# Patient Record
Sex: Male | Born: 1960 | Race: White | Hispanic: No | Marital: Married | Smoking: Never smoker
Health system: Southern US, Community
[De-identification: ages and names within clinical notes are randomized; demographics above are authoritative.]

## PROBLEM LIST (undated history)

## (undated) DIAGNOSIS — K219 Gastro-esophageal reflux disease without esophagitis: Secondary | ICD-10-CM

## (undated) DIAGNOSIS — R011 Cardiac murmur, unspecified: Secondary | ICD-10-CM

## (undated) DIAGNOSIS — G43909 Migraine, unspecified, not intractable, without status migrainosus: Secondary | ICD-10-CM

## (undated) DIAGNOSIS — I10 Essential (primary) hypertension: Secondary | ICD-10-CM

## (undated) DIAGNOSIS — J189 Pneumonia, unspecified organism: Secondary | ICD-10-CM

## (undated) DIAGNOSIS — E785 Hyperlipidemia, unspecified: Secondary | ICD-10-CM

## (undated) DIAGNOSIS — G4730 Sleep apnea, unspecified: Secondary | ICD-10-CM

## (undated) DIAGNOSIS — T7840XA Allergy, unspecified, initial encounter: Secondary | ICD-10-CM

## (undated) DIAGNOSIS — K5792 Diverticulitis of intestine, part unspecified, without perforation or abscess without bleeding: Secondary | ICD-10-CM

## (undated) DIAGNOSIS — Z87442 Personal history of urinary calculi: Secondary | ICD-10-CM

## (undated) DIAGNOSIS — K921 Melena: Secondary | ICD-10-CM

## (undated) HISTORY — DX: Cardiac murmur, unspecified: R01.1

## (undated) HISTORY — DX: Hyperlipidemia, unspecified: E78.5

## (undated) HISTORY — PX: NASAL RECONSTRUCTION WITH SEPTAL REPAIR: SHX5665

## (undated) HISTORY — DX: Melena: K92.1

## (undated) HISTORY — DX: Pneumonia, unspecified organism: J18.9

## (undated) HISTORY — PX: VASECTOMY: SHX75

## (undated) HISTORY — PX: APPENDECTOMY: SHX54

## (undated) HISTORY — DX: Migraine, unspecified, not intractable, without status migrainosus: G43.909

## (undated) HISTORY — PX: COLON SURGERY: SHX602

## (undated) HISTORY — DX: Allergy, unspecified, initial encounter: T78.40XA

---

## 1999-10-09 HISTORY — PX: SPINE SURGERY: SHX786

## 2012-06-26 ENCOUNTER — Ambulatory Visit (INDEPENDENT_AMBULATORY_CARE_PROVIDER_SITE_OTHER): Payer: BC Managed Care – PPO | Admitting: Family Medicine

## 2012-06-26 ENCOUNTER — Encounter: Payer: Self-pay | Admitting: Family Medicine

## 2012-06-26 VITALS — BP 130/92 | HR 72 | Temp 98.0°F | Resp 12 | Ht 69.25 in | Wt 192.0 lb

## 2012-06-26 DIAGNOSIS — Z139 Encounter for screening, unspecified: Secondary | ICD-10-CM

## 2012-06-26 DIAGNOSIS — Z8669 Personal history of other diseases of the nervous system and sense organs: Secondary | ICD-10-CM | POA: Insufficient documentation

## 2012-06-26 DIAGNOSIS — G43109 Migraine with aura, not intractable, without status migrainosus: Secondary | ICD-10-CM | POA: Insufficient documentation

## 2012-06-26 DIAGNOSIS — G43809 Other migraine, not intractable, without status migrainosus: Secondary | ICD-10-CM

## 2012-06-26 DIAGNOSIS — G252 Other specified forms of tremor: Secondary | ICD-10-CM

## 2012-06-26 DIAGNOSIS — F5104 Psychophysiologic insomnia: Secondary | ICD-10-CM

## 2012-06-26 DIAGNOSIS — Z8042 Family history of malignant neoplasm of prostate: Secondary | ICD-10-CM

## 2012-06-26 DIAGNOSIS — J309 Allergic rhinitis, unspecified: Secondary | ICD-10-CM

## 2012-06-26 DIAGNOSIS — G250 Essential tremor: Secondary | ICD-10-CM

## 2012-06-26 DIAGNOSIS — Z23 Encounter for immunization: Secondary | ICD-10-CM

## 2012-06-26 DIAGNOSIS — E785 Hyperlipidemia, unspecified: Secondary | ICD-10-CM | POA: Insufficient documentation

## 2012-06-26 DIAGNOSIS — G4700 Insomnia, unspecified: Secondary | ICD-10-CM

## 2012-06-26 LAB — HEPATIC FUNCTION PANEL
ALT: 34 U/L (ref 0–53)
AST: 25 U/L (ref 0–37)
Bilirubin, Direct: 0.3 mg/dL (ref 0.0–0.3)
Total Bilirubin: 2.5 mg/dL — ABNORMAL HIGH (ref 0.3–1.2)

## 2012-06-26 LAB — LIPID PANEL: Total CHOL/HDL Ratio: 6

## 2012-06-26 MED ORDER — CLONAZEPAM 0.5 MG PO TABS: 0.5000 mg | ORAL_TABLET | Freq: Every evening | ORAL | Status: DC | PRN

## 2012-06-26 MED ORDER — AZELASTINE HCL 0.1 % NA SOLN: 1.0000 | Freq: Two times a day (BID) | NASAL | Status: DC

## 2012-06-26 NOTE — Progress Notes (Signed)
  Subjective:    Patient ID: Tim Gordon, male    DOB: 1961/01/06, 51 y.o.   MRN: [id number]  HPI  Patient here to establish care. Past medical history reviewed. He has history of ocular migraines, dyslipidemia, chronic insomnia, essential tremor. Apparently had some intolerance previously to Niaspan and has taken couple of statins and likewise had side effects. He is currently treated with Zetia and Lovaza.  No history of diabetes. No history of CAD.  Patient has frequent nasal congestion symptoms. Usually relieved with Afrin and avoids using regularly. Previously used Flonase without much success. Has occasional drainage but mostly stuffiness. He thinks this may be allergy related. This seems to be year- round.  Previous surgery for appendectomy, vastectomy, and low back surgery 2001.  Family history significant for followup prostate cancer. Mother with valvular heart disease (no CAD) and hyperlipidemia as well as stroke history. Both parents had hypertension.  Patient works as a Armed forces training and education officer). He is married. Nonsmoker. Occasional alcohol use.  Past Medical History  Diagnosis Date  . Blood in stool   . Allergy   . Heart murmur   . Hyperlipidemia   . Migraines    Past Surgical History  Procedure Date  . Appendectomy   . Vasectomy   . Spine surgery 2001    rupture L4-L5    reports that he has never smoked. He does not have any smokeless tobacco history on file. His alcohol and drug histories not on file. family history includes Arthritis in his mother; Cancer in his maternal grandmother; Cancer (age of onset:65) in his father; Heart disease in his mother; Hyperlipidemia in his mother; Hypertension in his father and mother; and Stroke in his mother and paternal grandfather. Allergies  Allergen Reactions  . Biaxin (Clarithromycin)     "went right through me"      Review of Systems  Constitutional: Negative for fever, chills, appetite change,  fatigue and unexpected weight change.  HENT: Positive for congestion.   Respiratory: Negative for cough and shortness of breath.   Cardiovascular: Negative for chest pain.  Gastrointestinal: Negative for abdominal pain.  Neurological: Negative for headaches.  Hematological: Negative for adenopathy.       Objective:   Physical Exam  Constitutional: He is oriented to person, place, and time. He appears well-developed and well-nourished. No distress.  HENT:  Right Ear: External ear normal.  Left Ear: External ear normal.  Mouth/Throat: Oropharynx is clear and moist.  Neck: Neck supple. No thyromegaly present.  Cardiovascular: Normal rate and regular rhythm.   Pulmonary/Chest: Effort normal and breath sounds normal. No respiratory distress. He has no wheezes. He has no rales.  Musculoskeletal: He exhibits no edema.  Lymphadenopathy:    He has no cervical adenopathy.  Neurological: He is alert and oriented to person, place, and time. No cranial nerve deficit.          Assessment & Plan:  #1 dyslipidemia. Repeat lipid panel and hepatic panel. History of what sounds like very high triglycerides  #2 rhinitis, probably allergic. Trial of Astelin nasal 1 spray per nostril twice daily  #3 history of ocular migraines which are infrequent  #4 history of chronic insomnia. Refill clonazepam for 6 months  #5 essential tremor. Continue low-dose propranolol #6 history of reported heart murmur though none noted on exam today

## 2012-06-27 NOTE — Progress Notes (Signed)
Quick Note:  Pt informed ______ 

## 2012-09-16 ENCOUNTER — Telehealth: Payer: Self-pay | Admitting: Family Medicine

## 2012-09-16 DIAGNOSIS — G250 Essential tremor: Secondary | ICD-10-CM

## 2012-09-16 MED ORDER — PROPRANOLOL HCL 20 MG PO TABS: 20.0000 mg | ORAL_TABLET | Freq: Every day | ORAL | Status: DC

## 2012-09-16 NOTE — Telephone Encounter (Signed)
Patient's wife called stating that he need a new rx for his propranolol hcl 20mg  1poqd sent to [facility] for a 90 day supply. Please assist.

## 2012-12-01 ENCOUNTER — Ambulatory Visit (INDEPENDENT_AMBULATORY_CARE_PROVIDER_SITE_OTHER): Payer: BC Managed Care – PPO | Admitting: Family Medicine

## 2012-12-01 ENCOUNTER — Encounter: Payer: Self-pay | Admitting: Family Medicine

## 2012-12-01 VITALS — BP 128/90 | HR 67 | Temp 98.7°F | Wt 198.0 lb

## 2012-12-01 DIAGNOSIS — B9789 Other viral agents as the cause of diseases classified elsewhere: Secondary | ICD-10-CM

## 2012-12-01 MED ORDER — HYDROCODONE-HOMATROPINE 5-1.5 MG/5ML PO SYRP: 5.0000 mL | ORAL_SOLUTION | Freq: Every evening | ORAL | Status: DC | PRN

## 2012-12-01 MED ORDER — ALBUTEROL SULFATE HFA 108 (90 BASE) MCG/ACT IN AERS: 2.0000 | INHALATION_SPRAY | Freq: Four times a day (QID) | RESPIRATORY_TRACT | Status: DC | PRN

## 2012-12-01 MED ORDER — FLUTICASONE PROPIONATE 50 MCG/ACT NA SUSP: 2.0000 | Freq: Every day | NASAL | Status: DC

## 2012-12-01 NOTE — Progress Notes (Signed)
Chief Complaint  Patient presents with  . Cough    back of throat scratchy, right ear plugged, drainage, fatigue, congestion, chest heavy    HPI:  Acute visit for cough and ear congestion: -started: 6 days ago -symptoms:nasal congestion, sore and scratchy  throat, cough, drainage in throat, this mucus, chest heavy when coughing, R ear is full, sinus pressure on and off -denies:fever, SOB, NVD, tooth pain, strep or mono exposure -has tried: sudafed and OTC cough medication -sick contacts: none known -Hx of: hx of allergies, no hx lung disease or sinusitis  ROS: See pertinent positives and negatives per HPI.  Past Medical History  Diagnosis Date  . Blood in stool   . Allergy   . Heart murmur   . Hyperlipidemia   . Migraines     Family History  Problem Relation Age of Onset  . Arthritis Mother   . Hyperlipidemia Mother   . Stroke Mother   . Hypertension Mother   . Heart disease Mother     valvular heart disease ? which valve.  . Cancer Father 15    prostate  . Hypertension Father   . Cancer Maternal Grandmother     ovarian  . Stroke Paternal Grandfather     History   Social History  . Marital Status: Married    Spouse Name: N/A    Number of Children: N/A  . Years of Education: N/A   Social History Main Topics  . Smoking status: Never Smoker   . Smokeless tobacco: None  . Alcohol Use: None  . Drug Use: None  . Sexually Active: None   Other Topics Concern  . None   Social History Narrative  . None    Current outpatient prescriptions:clonazePAM (KLONOPIN) 0.5 MG tablet, Take 1 tablet (0.5 mg total) by mouth at bedtime as needed for anxiety. 1/2 tab at bedtime, Disp: 30 tablet, Rfl: 5;  LOVAZA 1 G capsule, Take 2 g by mouth 2 (two) times daily. , Disp: , Rfl: ;  propranolol (INDERAL) 20 MG tablet, Take 1 tablet (20 mg total) by mouth daily., Disp: 90 tablet, Rfl: 3;  ZETIA 10 MG tablet, Take 10 mg by mouth daily. , Disp: , Rfl:  albuterol (PROVENTIL  HFA;VENTOLIN HFA) 108 (90 BASE) MCG/ACT inhaler, Inhale 2 puffs into the lungs every 6 (six) hours as needed for wheezing., Disp: 1 Inhaler, Rfl: 0;  azelastine (ASTELIN) 137 MCG/SPRAY nasal spray, Place 1 spray into the nose 2 (two) times daily. Use in each nostril as directed, Disp: 30 mL, Rfl: 12;  fluticasone (FLONASE) 50 MCG/ACT nasal spray, Place 2 sprays into the nose daily., Disp: 16 g, Rfl: 2 HYDROcodone-homatropine (HYCODAN) 5-1.5 MG/5ML syrup, Take 5 mLs by mouth at bedtime as needed for cough., Disp: 120 mL, Rfl: 0  EXAM:  Filed Vitals:   12/01/12 0911  BP: 128/90  Pulse: 67  Temp: 98.7 F (37.1 C)    Body mass index is 29.03 kg/(m^2).  GENERAL: vitals reviewed and listed above, alert, oriented, appears well hydrated and in no acute distress  HEENT: atraumatic, conjunttiva clear, no obvious abnormalities on inspection of external nose and ears, normal appearance of ear canals and TMs, clear nasal congestion, mild post oropharyngeal erythema with PND, no tonsillar edema or exudate, no sinus TTP  NECK: no obvious masses on inspection  LUNGS: clear to auscultation bilaterally, no wheezes, rales or rhonchi, good air movement  CV: HRRR, no peripheral edema  MS: moves all extremities without noticeable abnormality  PSYCH: pleasant and cooperative, no obvious depression or anxiety  ASSESSMENT AND PLAN:  Discussed the following assessment and plan:  Unspecified viral infection, in conditions classified elsewhere and of unspecified site - Plan: fluticasone (FLONASE) 50 MCG/ACT nasal spray, albuterol (PROVENTIL HFA;VENTOLIN HFA) 108 (90 BASE) MCG/ACT inhaler, HYDROcodone-homatropine (HYCODAN) 5-1.5 MG/5ML syrup  -advised supportive care and return precautions. Cough medication given - risks discussed. -Patient advised to return or notify a doctor immediately if symptoms worsen or persist or new concerns arise.  Patient Instructions  INSTRUCTIONS FOR UPPER RESPIRATORY  INFECTION:  -plenty of rest and fluids  -nasal saline wash 2-3 times daily (use prepackaged nasal saline or bottled/distilled water if making your own)   -clean nose with nasal saline before using the nasal steroid or sinex  -can use sinex nasal spray for drainage and nasal congestion - but do NOT use longer then 3-4 days  -can use tylenol or ibuprofen as directed for aches and sorethroat  -in the winter time, using a humidifier at night is helpful (please follow cleaning instructions)  -if you are taking a cough medication - use only as directed, may also try a teaspoon of honey to coat the throat and throat lozenges  -for sore throat, salt water gargles can help  -follow up if you have fevers, facial pain, tooth pain, difficulty breathing or are worsening or not getting better in 5-7 days      Donnielle Addison R.

## 2012-12-01 NOTE — Patient Instructions (Addendum)

## 2012-12-24 ENCOUNTER — Ambulatory Visit: Payer: BC Managed Care – PPO | Admitting: Family Medicine

## 2012-12-30 ENCOUNTER — Encounter: Payer: Self-pay | Admitting: Family Medicine

## 2012-12-30 ENCOUNTER — Ambulatory Visit (INDEPENDENT_AMBULATORY_CARE_PROVIDER_SITE_OTHER): Payer: BC Managed Care – PPO | Admitting: Family Medicine

## 2012-12-30 VITALS — BP 118/82 | Temp 98.3°F | Wt 200.0 lb

## 2012-12-30 DIAGNOSIS — R0683 Snoring: Secondary | ICD-10-CM

## 2012-12-30 DIAGNOSIS — M7582 Other shoulder lesions, left shoulder: Secondary | ICD-10-CM

## 2012-12-30 DIAGNOSIS — E785 Hyperlipidemia, unspecified: Secondary | ICD-10-CM

## 2012-12-30 DIAGNOSIS — M25512 Pain in left shoulder: Secondary | ICD-10-CM

## 2012-12-30 DIAGNOSIS — M67919 Unspecified disorder of synovium and tendon, unspecified shoulder: Secondary | ICD-10-CM

## 2012-12-30 DIAGNOSIS — M25519 Pain in unspecified shoulder: Secondary | ICD-10-CM

## 2012-12-30 DIAGNOSIS — F5104 Psychophysiologic insomnia: Secondary | ICD-10-CM

## 2012-12-30 DIAGNOSIS — G4700 Insomnia, unspecified: Secondary | ICD-10-CM

## 2012-12-30 DIAGNOSIS — G250 Essential tremor: Secondary | ICD-10-CM

## 2012-12-30 DIAGNOSIS — R0609 Other forms of dyspnea: Secondary | ICD-10-CM

## 2012-12-30 NOTE — Patient Instructions (Addendum)
Consider complete physical at follow up next Fall. Try daily use of Flonase and Astelin.

## 2012-12-30 NOTE — Progress Notes (Signed)
  Subjective:    Patient ID: Tim Gordon, male    DOB: [birth year], 52 y.o.   MRN: [id number]  HPI Patient seen for several issues as follows  Skin rash left forearm. Mostly asymptomatic. Onset couple days ago after doing yard work. Has not tried anything topically.  Progressive left shoulder pain for several months. No injury. No weakness. Describes achiness especially at night. Worse with abduction and internal rotation. History of similar process in the past. Over the years had about 3 corticosteroid injections. Never x-rayed.  Progressive snoring. Previously studied for sleep apnea with none noted. Frequent nasal congestion. Has prescriptions for Flonase and Astelin but not taking either consistently No purulent secretions. Afrin helps slightly.  Chronic insomnia treated with Klonopin. No problems. Essential tremor treated with Inderal. Continues to control tremor fairly well.  Past Medical History  Diagnosis Date  . Blood in stool   . Allergy   . Heart murmur   . Hyperlipidemia   . Migraines    Past Surgical History  Procedure Laterality Date  . Appendectomy    . Vasectomy    . Spine surgery  2001    rupture L4-L5    reports that he has never smoked. He does not have any smokeless tobacco history on file. His alcohol and drug histories are not on file. family history includes Arthritis in his mother; Cancer in his maternal grandmother; Cancer (age of onset: 4) in his father; Heart disease in his mother; Hyperlipidemia in his mother; Hypertension in his father and mother; and Stroke in his mother and paternal grandfather. Allergies  Allergen Reactions  . Biaxin (Clarithromycin)     "went right through me"      Review of Systems  Constitutional: Negative for fatigue.  Eyes: Negative for visual disturbance.  Respiratory: Negative for cough, chest tightness and shortness of breath.   Cardiovascular: Negative for chest pain, palpitations and leg swelling.   Neurological: Negative for dizziness, syncope, weakness, light-headedness and headaches.       Objective:   Physical Exam  Constitutional: He appears well-developed and well-nourished.  Neck: Neck supple. No thyromegaly present.  Cardiovascular: Normal rate and regular rhythm.   Pulmonary/Chest: Effort normal and breath sounds normal. No respiratory distress. He has no wheezes. He has no rales.  Musculoskeletal:  Left shoulder reveals full range of motion. Mild pain with abduction against resistance. No pain with external rotation. No bicipital tenderness. No a.c. joint tenderness. No localized bony tenderness  Lymphadenopathy:    He has no cervical adenopathy.  Neurological:  Full-strength upper extremities including rotator cuff  Skin: Rash noted.  Left forearm reveals slightly erythematous follicular type rash with no pustules left forearm. No vesicles. Minimally raised. Nontender. He has a few similar lesions on the right forearm          Assessment & Plan:  #1 essential tremor. Stable on low-dose Inderal #2 chronic insomnia. Continue clonazepam which he has taken for years. #3 nonspecific follicular rash left forearm. Reassurance. Try topical over-the-counter hydrocortisone cream #4 left shoulder pain. Suspect rotator cuff tendinitis. Given duration of symptoms x-rays left shoulder. Consider corticosteroid injection if no significant abnormalities. No suspicion for rotator cuff tear  #5 nasal congestion probably secondary to allergic rhinitis. Try regular Flonase and Astelin over the next month and be in touch if not seeing significant improvement

## 2012-12-31 ENCOUNTER — Ambulatory Visit (INDEPENDENT_AMBULATORY_CARE_PROVIDER_SITE_OTHER)
Admission: RE | Admit: 2012-12-31 | Discharge: 2012-12-31 | Disposition: A | Discharge status: Home / Self Care | Payer: BC Managed Care – PPO | Source: Ambulatory Visit | Attending: Family Medicine | Admitting: Family Medicine

## 2012-12-31 DIAGNOSIS — M25512 Pain in left shoulder: Secondary | ICD-10-CM

## 2012-12-31 DIAGNOSIS — M25519 Pain in unspecified shoulder: Secondary | ICD-10-CM

## 2013-01-01 NOTE — Progress Notes (Signed)
Quick Note:  Pt would like ortho referral and would appreciate your recommendation. ______

## 2013-01-03 ENCOUNTER — Other Ambulatory Visit: Payer: Self-pay | Admitting: Family Medicine

## 2013-01-05 NOTE — Telephone Encounter (Signed)
Clonazepam at [facility] last filled 06-26-12, #30 with 5 refills

## 2013-01-06 NOTE — Telephone Encounter (Signed)
Refill for 6 months. 

## 2013-02-13 ENCOUNTER — Ambulatory Visit (INDEPENDENT_AMBULATORY_CARE_PROVIDER_SITE_OTHER): Payer: BC Managed Care – PPO | Admitting: Family Medicine

## 2013-02-13 ENCOUNTER — Encounter: Payer: Self-pay | Admitting: Family Medicine

## 2013-02-13 VITALS — BP 120/88 | Temp 98.2°F

## 2013-02-13 DIAGNOSIS — M7530 Calcific tendinitis of unspecified shoulder: Secondary | ICD-10-CM

## 2013-02-13 DIAGNOSIS — M7532 Calcific tendinitis of left shoulder: Secondary | ICD-10-CM

## 2013-02-13 DIAGNOSIS — R059 Cough, unspecified: Secondary | ICD-10-CM

## 2013-02-13 DIAGNOSIS — R053 Chronic cough: Secondary | ICD-10-CM

## 2013-02-13 DIAGNOSIS — R05 Cough: Secondary | ICD-10-CM

## 2013-02-13 NOTE — Patient Instructions (Addendum)
Elevate head of bed 6-8 inches. Avoid any mentholated cough drops or mints Try over the counter antihistamine such as brompheniramine or chorpheniramine at night. Stay on daytime Zyrtec. Start over the counter Prilosec one daily -20 mg  Pulmicort 2 puffs twice daily and rinse mouth after each use.  We will call you with orthopedic referral.

## 2013-02-13 NOTE — Progress Notes (Signed)
  Subjective:    Patient ID: Tim Gordon, male    DOB: 1961-08-07, 52 y.o.   MRN: [id number]  HPI Patient here for the following issues  Chronic cough since February. Cough did briefly improved after initial treatment of antibiotic but has been persistent and intermittent since. Cough is dry. No associated fevers, chills, night sweats, appetite changes, or any weight changes. Nonsmoker. No obvious wheezing. Does have some nasal congestion and allergic postnasal drip symptoms. Occasional GERD symptoms but none actively. Cough is worse during the day versus night. He was in [place] in late April and went to urgent care. Prescribed Zithromax and chest x-ray apparently showed" bronchitis" changes.  Generally does not feel poorly but cough is bothersome. Has taken Tessalon Perles without relief. Using occasional mentholated cough drops.  Already taking Zyrtec and Astelin for allergy issues. No ACE inhibitor use.  Persistent left shoulder pains. Recent x-rays showed probable calcific tendinitis. Starting to have increased night pain. No weakness. Pain is moderate and worse with abduction. Has had previous steroid injections x2 without improvement  Past Medical History  Diagnosis Date  . Blood in stool   . Allergy   . Heart murmur   . Hyperlipidemia   . Migraines    Past Surgical History  Procedure Laterality Date  . Appendectomy    . Vasectomy    . Spine surgery  2001    rupture L4-L5    reports that he has never smoked. He does not have any smokeless tobacco history on file. His alcohol and drug histories are not on file. family history includes Arthritis in his mother; Cancer in his maternal grandmother; Cancer (age of onset: 77) in his father; Heart disease in his mother; Hyperlipidemia in his mother; Hypertension in his father and mother; and Stroke in his mother and paternal grandfather. Allergies  Allergen Reactions  . Biaxin (Clarithromycin)     "went right through me"       Review of Systems  Constitutional: Negative for fever, chills, appetite change, fatigue and unexpected weight change.  HENT: Positive for postnasal drip. Negative for sore throat and sinus pressure.   Respiratory: Positive for cough. Negative for shortness of breath and wheezing.   Cardiovascular: Negative for chest pain, palpitations and leg swelling.  Neurological: Negative for dizziness and syncope.       Objective:   Physical Exam  Constitutional: He appears well-developed and well-nourished.  HENT:  Right Ear: External ear normal.  Left Ear: External ear normal.  Mouth/Throat: Oropharynx is clear and moist.  Neck: Neck supple. No thyromegaly present.  Cardiovascular: Normal rate and regular rhythm.   Pulmonary/Chest: Effort normal and breath sounds normal. No respiratory distress. He has no wheezes. He has no rales.  Musculoskeletal: He exhibits no edema.  Left shoulder reveals pain with abduction against resistance. No definite weakness.  Lymphadenopathy:    He has no cervical adenopathy.          Assessment & Plan:  #1 chronic cough. Recent chest x-ray as above reportedly normal. Nonfocal exam. Question allergic postnasal drip. Question GERD component. Question post viral bronchitis. Patient instructed to elevate head of bed 6-8 inches. Avoid mint or mentholated products. Try over-the-counter brompheniramine or chlorpheniramine at night. Continue daytime use of Zyrtec. Over-the-counter Prilosec 20 milligrams daily. Doubt reactive airway component but sample given Pulmicort 2 puffs twice daily with instructions for use. Consider pulmonary referral if no better in 2 weeks #2 chronic left shoulder pain. 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 Orthopedic referral

## 2013-04-21 ENCOUNTER — Telehealth: Payer: Self-pay | Admitting: Family Medicine

## 2013-04-21 DIAGNOSIS — E785 Hyperlipidemia, unspecified: Secondary | ICD-10-CM

## 2013-04-21 NOTE — Telephone Encounter (Signed)
Pt is a new pt w/ Dr Caryl Never, Needs new script for ZETIA 10 MG tablet 1/ day/  90 day supply (w/ 3 refills) [facility] (Pt has only 9 days left).

## 2013-04-22 MED ORDER — EZETIMIBE 10 MG PO TABS: 10.0000 mg | ORAL_TABLET | Freq: Every day | ORAL | Status: DC

## 2013-04-22 NOTE — Telephone Encounter (Signed)
[facility]

## 2013-04-27 ENCOUNTER — Telehealth: Payer: Self-pay | Admitting: Family Medicine

## 2013-04-27 DIAGNOSIS — B9789 Other viral agents as the cause of diseases classified elsewhere: Secondary | ICD-10-CM

## 2013-04-27 MED ORDER — FLUTICASONE PROPIONATE 50 MCG/ACT NA SUSP: 2.0000 | Freq: Every day | NASAL | Status: DC

## 2013-04-27 NOTE — Telephone Encounter (Signed)
Pt wife called to request a 3 month supply of fluticasone (FLONASE) 50 MCG/ACT nasal spray for the pt. She would like a 3 [facility] supply [facility]. Please assist.

## 2013-04-27 NOTE — Telephone Encounter (Signed)
Refill sent to mail order. 

## 2013-05-12 ENCOUNTER — Telehealth: Payer: Self-pay | Admitting: Family Medicine

## 2013-05-12 DIAGNOSIS — E785 Hyperlipidemia, unspecified: Secondary | ICD-10-CM

## 2013-05-12 MED ORDER — OMEGA-3-ACID ETHYL ESTERS 1 G PO CAPS: 2.0000 g | ORAL_CAPSULE | Freq: Two times a day (BID) | ORAL | Status: DC

## 2013-05-12 NOTE — Telephone Encounter (Signed)
RX sent to pharmacy  

## 2013-05-12 NOTE — Telephone Encounter (Signed)
Pt needs new rx  Generic lovaza 1 G cap take 2 capsules twice a day #360  With [facility]

## 2013-07-06 ENCOUNTER — Ambulatory Visit: Payer: BC Managed Care – PPO | Admitting: Family Medicine

## 2013-07-08 ENCOUNTER — Other Ambulatory Visit: Payer: Self-pay | Admitting: Family Medicine

## 2013-07-08 ENCOUNTER — Ambulatory Visit: Payer: BC Managed Care – PPO | Admitting: Family Medicine

## 2013-07-08 NOTE — Telephone Encounter (Signed)
Last refill #30 5 refills on 01/03/13

## 2013-07-08 NOTE — Telephone Encounter (Signed)
Refill for 6 months. 

## 2013-07-09 ENCOUNTER — Encounter: Payer: Self-pay | Admitting: Family Medicine

## 2013-07-09 ENCOUNTER — Ambulatory Visit (INDEPENDENT_AMBULATORY_CARE_PROVIDER_SITE_OTHER): Payer: BC Managed Care – PPO | Admitting: Family Medicine

## 2013-07-09 VITALS — BP 128/80 | HR 57 | Temp 98.2°F | Wt 198.0 lb

## 2013-07-09 DIAGNOSIS — F5104 Psychophysiologic insomnia: Secondary | ICD-10-CM

## 2013-07-09 DIAGNOSIS — Z23 Encounter for immunization: Secondary | ICD-10-CM

## 2013-07-09 DIAGNOSIS — G250 Essential tremor: Secondary | ICD-10-CM

## 2013-07-09 DIAGNOSIS — G4700 Insomnia, unspecified: Secondary | ICD-10-CM

## 2013-07-09 DIAGNOSIS — E785 Hyperlipidemia, unspecified: Secondary | ICD-10-CM

## 2013-07-09 NOTE — Progress Notes (Signed)
  Subjective:    Patient ID: Tim Gordon, male    DOB: 1961-09-17, 52 y.o.   MRN: [id number]  HPI Patient seen for routine medical followup has history of hyperlipidemia, chronic insomnia, essential tremor, and history of migraine headaches Essential tremor fairly well controlled with low dose propranolol- 20 mg once or twice daily No progression in symptoms.  Hyperlipidemia treated with zetia and lovaza.  He plans to get lab work soon through his work and he plans to bring copy here for [place] to scan. No history of premature CAD. Weight has been stable for several years.  Chronic insomnia on clonazepam. This works at a low dosage. No significant alcohol use.  Past Medical History  Diagnosis Date  . Blood in stool   . Allergy   . Heart murmur   . Hyperlipidemia   . Migraines    Past Surgical History  Procedure Laterality Date  . Appendectomy    . Vasectomy    . Spine surgery  2001    rupture L4-L5    reports that he has never smoked. He does not have any smokeless tobacco history on file. His alcohol and drug histories are not on file. family history includes Arthritis in his mother; Cancer in his maternal grandmother; Cancer (age of onset: 57) in his father; Heart disease in his mother; Hyperlipidemia in his mother; Hypertension in his father and mother; Stroke in his mother and paternal grandfather. Allergies  Allergen Reactions  . Biaxin [Clarithromycin]     "went right through me"      Review of Systems  Constitutional: Negative for appetite change, fatigue and unexpected weight change.  Eyes: Negative for visual disturbance.  Respiratory: Negative for cough, chest tightness and shortness of breath.   Cardiovascular: Negative for chest pain, palpitations and leg swelling.  Gastrointestinal: Negative for abdominal pain.  Endocrine: Negative for polydipsia and polyuria.  Neurological: Positive for tremors. Negative for dizziness, syncope, weakness, light-headedness and  headaches.  Psychiatric/Behavioral: Positive for sleep disturbance. Negative for dysphoric mood.       Objective:   Physical Exam  Constitutional: He appears well-developed and well-nourished.  HENT:  Mouth/Throat: Oropharynx is clear and moist.  Neck: Neck supple. No thyromegaly present.  Cardiovascular: Normal rate and regular rhythm.   Pulmonary/Chest: Effort normal and breath sounds normal. No respiratory distress. He has no wheezes. He has no rales.  Musculoskeletal: He exhibits no edema.  Neurological:  Very mild tremor left upper extremity greater than right.          Assessment & Plan:  #1 essential tremor controlled on low-dose propranolol #2 chronic insomnia. Continue as needed clonazepam which was refilled yesterday #3 dyslipidemia. Planning to get labs at his workplace and he will forward copies here. Continue medications above

## 2013-07-09 NOTE — Patient Instructions (Signed)
Hypertriglyceridemia  Diet for High blood levels of Triglycerides Most fats in food are triglycerides. Triglycerides in your blood are stored as fat in your body. High levels of triglycerides in your blood may put you at a greater risk for heart disease and stroke.  Normal triglyceride levels are less than 150 mg/dL. Borderline high levels are 150-199 mg/dl. High levels are 200 - 499 mg/dL, and very high triglyceride levels are greater than 500 mg/dL. The decision to treat high triglycerides is generally based on the level. For people with borderline or high triglyceride levels, treatment includes weight loss and exercise. Drugs are recommended for people with very high triglyceride levels. Many people who need treatment for high triglyceride levels have metabolic syndrome. This syndrome is a collection of disorders that often include: insulin resistance, high blood pressure, blood clotting problems, high cholesterol and triglycerides. TESTING PROCEDURE FOR TRIGLYCERIDES  You should not eat 4 hours before getting your triglycerides measured. The normal range of triglycerides is between 10 and 250 milligrams per deciliter (mg/dl). Some people may have extreme levels (1000 or above), but your triglyceride level may be too high if it is above 150 mg/dl, depending on what other risk factors you have for heart disease.  People with high blood triglycerides may also have high blood cholesterol levels. If you have high blood cholesterol as well as high blood triglycerides, your risk for heart disease is probably greater than if you only had high triglycerides. High blood cholesterol is one of the main risk factors for heart disease. CHANGING YOUR DIET  Your weight can affect your blood triglyceride level. If you are more than 20% above your ideal body weight, you may be able to lower your blood triglycerides by losing weight. Eating less and exercising regularly is the best way to combat this. Fat provides more  calories than any other food. The best way to lose weight is to eat less fat. Only 30% of your total calories should come from fat. Less than 7% of your diet should come from saturated fat. A diet low in fat and saturated fat is the same as a diet to decrease blood cholesterol. By eating a diet lower in fat, you may lose weight, lower your blood cholesterol, and lower your blood triglyceride level.  Eating a diet low in fat, especially saturated fat, may also help you lower your blood triglyceride level. Ask your dietitian to help you figure how much fat you can eat based on the number of calories your caregiver has prescribed for you.  Exercise, in addition to helping with weight loss may also help lower triglyceride levels.   Alcohol can increase blood triglycerides. You may need to stop drinking alcoholic beverages.  Too much carbohydrate in your diet may also increase your blood triglycerides. Some complex carbohydrates are necessary in your diet. These may include bread, rice, potatoes, other starchy vegetables and cereals.  Reduce "simple" carbohydrates. These may include pure sugars, candy, honey, and jelly without losing other nutrients. If you have the kind of high blood triglycerides that is affected by the amount of carbohydrates in your diet, you will need to eat less sugar and less high-sugar foods. Your caregiver can help you with this.  Adding 2-4 grams of fish oil (EPA+ DHA) may also help lower triglycerides. Speak with your caregiver before adding any supplements to your regimen. Following the Diet  Maintain your ideal weight. Your caregivers can help you with a diet. Generally, eating less food and getting more   exercise will help you lose weight. Joining a weight control group may also help. Ask your caregivers for a good weight control group in your area.  Eat low-fat foods instead of high-fat foods. This can help you lose weight too.  These foods are lower in fat. Eat MORE of these:    Dried beans, peas, and lentils.  Egg whites.  Low-fat cottage cheese.  Fish.  Lean cuts of meat, such as round, sirloin, rump, and flank (cut extra fat off meat you fix).  Whole grain breads, cereals and pasta.  Skim and nonfat dry milk.  Low-fat yogurt.  Poultry without the skin.  Cheese made with skim or part-skim milk, such as mozzarella, parmesan, farmers', ricotta, or pot cheese. These are higher fat foods. Eat LESS of these:   Whole milk and foods made from whole milk, such as American, blue, cheddar, monterey jack, and swiss cheese  High-fat meats, such as luncheon meats, sausages, knockwurst, bratwurst, hot dogs, ribs, corned beef, ground pork, and regular ground beef.  Fried foods. Limit saturated fats in your diet. Substituting unsaturated fat for saturated fat may decrease your blood triglyceride level. You will need to read package labels to know which products contain saturated fats.  These foods are high in saturated fat. Eat LESS of these:   Fried pork skins.  Whole milk.  Skin and fat from poultry.  Palm oil.  Butter.  Shortening.  Cream cheese.  Bacon.  Margarines and baked goods made from listed oils.  Vegetable shortenings.  Chitterlings.  Fat from meats.  Coconut oil.  Palm kernel oil.  Lard.  Cream.  Sour cream.  Fatback.  Coffee whiteners and non-dairy creamers made with these oils.  Cheese made from whole milk. Use unsaturated fats (both polyunsaturated and monounsaturated) moderately. Remember, even though unsaturated fats are better than saturated fats; you still want a diet low in total fat.  These foods are high in unsaturated fat:   Canola oil.  Sunflower oil.  Mayonnaise.  Almonds.  Peanuts.  Pine nuts.  Margarines made with these oils.  Safflower oil.  Olive oil.  Avocados.  Cashews.  Peanut butter.  Sunflower seeds.  Soybean oil.  Peanut  oil.  Olives.  Pecans.  Walnuts.  Pumpkin seeds. Avoid sugar and other high-sugar foods. This will decrease carbohydrates without decreasing other nutrients. Sugar in your food goes rapidly to your blood. When there is excess sugar in your blood, your liver may use it to make more triglycerides. Sugar also contains calories without other important nutrients.  Eat LESS of these:   Sugar, brown sugar, powdered sugar, jam, jelly, preserves, honey, syrup, molasses, pies, candy, cakes, cookies, frosting, pastries, colas, soft drinks, punches, fruit drinks, and regular gelatin.  Avoid alcohol. Alcohol, even more than sugar, may increase blood triglycerides. In addition, alcohol is high in calories and low in nutrients. Ask for sparkling water, or a diet soft drink instead of an alcoholic beverage. Suggestions for planning and preparing meals   Bake, broil, grill or roast meats instead of frying.  Remove fat from meats and skin from poultry before cooking.  Add spices, herbs, lemon juice or vinegar to vegetables instead of salt, rich sauces or gravies.  Use a non-stick skillet without fat or use no-stick sprays.  Cool and refrigerate stews and broth. Then remove the hardened fat floating on the surface before serving.  Refrigerate meat drippings and skim off fat to make low-fat gravies.  Serve more fish.  Use less butter,   margarine and other high-fat spreads on bread or vegetables.  Use skim or reconstituted non-fat dry milk for cooking.  Cook with low-fat cheeses.  Substitute low-fat yogurt or cottage cheese for all or part of the sour cream in recipes for sauces, dips or congealed salads.  Use half yogurt/half mayonnaise in salad recipes.  Substitute evaporated skim milk for cream. Evaporated skim milk or reconstituted non-fat dry milk can be whipped and substituted for whipped cream in certain recipes.  Choose fresh fruits for dessert instead of high-fat foods such as pies or  cakes. Fruits are naturally low in fat. When Dining Out   Order low-fat appetizers such as fruit or vegetable juice, pasta with vegetables or tomato sauce.  Select clear, rather than cream soups.  Ask that dressings and gravies be served on the side. Then use less of them.  Order foods that are baked, broiled, poached, steamed, stir-fried, or roasted.  Ask for margarine instead of butter, and use only a small amount.  Drink sparkling water, unsweetened tea or coffee, or diet soft drinks instead of alcohol or other sweet beverages. QUESTIONS AND ANSWERS ABOUT OTHER FATS IN THE BLOOD: SATURATED FAT, TRANS FAT, AND CHOLESTEROL What is trans fat? Trans fat is a type of fat that is formed when vegetable oil is hardened through a process called hydrogenation. This process helps makes foods more solid, gives them shape, and prolongs their shelf life. Trans fats are also called hydrogenated or partially hydrogenated oils.  What do saturated fat, trans fat, and cholesterol in foods have to do with heart disease? Saturated fat, trans fat, and cholesterol in the diet all raise the level of LDL "bad" cholesterol in the blood. The higher the LDL cholesterol, the greater the risk for coronary heart disease (CHD). Saturated fat and trans fat raise LDL similarly.  What foods contain saturated fat, trans fat, and cholesterol? High amounts of saturated fat are found in animal products, such as fatty cuts of meat, chicken skin, and full-fat dairy products like butter, whole milk, cream, and cheese, and in tropical vegetable oils such as palm, palm kernel, and coconut oil. Trans fat is found in some of the same foods as saturated fat, such as vegetable shortening, some margarines (especially hard or stick margarine), crackers, cookies, baked goods, fried foods, salad dressings, and other processed foods made with partially hydrogenated vegetable oils. Small amounts of trans fat also occur naturally in some animal  products, such as milk products, beef, and lamb. Foods high in cholesterol include liver, other organ meats, egg yolks, shrimp, and full-fat dairy products. How can I use the new food label to make heart-healthy food choices? Check the Nutrition Facts panel of the food label. Choose foods lower in saturated fat, trans fat, and cholesterol. For saturated fat and cholesterol, you can also use the Percent Daily Value (%DV): 5% DV or less is low, and 20% DV or more is high. (There is no %DV for trans fat.) Use the Nutrition Facts panel to choose foods low in saturated fat and cholesterol, and if the trans fat is not listed, read the ingredients and limit products that list shortening or hydrogenated or partially hydrogenated vegetable oil, which tend to be high in trans fat. POINTS TO REMEMBER:   Discuss your risk for heart disease with your caregivers, and take steps to reduce risk factors.  Change your diet. Choose foods that are low in saturated fat, trans fat, and cholesterol.  Add exercise to your daily routine if   it is not already being done. Participate in physical activity of moderate intensity, like brisk walking, for at least 30 minutes on most, and preferably all days of the week. No time? Break the 30 minutes into three, 10-minute segments during the day.  Stop smoking. If you do smoke, contact your caregiver to discuss ways in which they can help you quit.  Do not use street drugs.  Maintain a normal weight.  Maintain a healthy blood pressure.  Keep up with your blood work for checking the fats in your blood as directed by your caregiver. Document Released: 07/12/2004 Document Revised: 03/25/2012 Document Reviewed: 02/07/2009 [employer] Patient Information 2014 [employer].  

## 2013-07-20 ENCOUNTER — Telehealth: Payer: Self-pay | Admitting: Family Medicine

## 2013-07-20 DIAGNOSIS — K5792 Diverticulitis of intestine, part unspecified, without perforation or abscess without bleeding: Secondary | ICD-10-CM | POA: Insufficient documentation

## 2013-07-20 NOTE — Telephone Encounter (Signed)
Pt wife is calling to inform MD her husband  is having severe abd pain and in route to [facility].

## 2013-07-20 NOTE — Telephone Encounter (Signed)
Pt is at [facility] and the wife will call us with a update as soon as she can

## 2013-07-21 ENCOUNTER — Telehealth: Payer: Self-pay | Admitting: Family Medicine

## 2013-07-21 NOTE — Telephone Encounter (Signed)
[facility] Triage Call Report Triage Record Num: [id number] Operator: Roselyn Meier Patient Name: Tim Gordon Call Date & Time: 07/20/2013 8:56:59PM Patient Phone: [phone] PCP: Evelena Peat Patient Gender: Male PCP Fax : [phone] Patient DOB: 1960/12/03 [facility] Name: [name]  Reason for Call: Caller: [name]; PCP: Evelena Peat (Family Practice); CB#: [phone]; Call regarding Spouse requesting to speak with MD on call; Wife is calling to let Dr Caryl Never know that pt is going to be admitted to [facility]. Pt would like for Dr Caryl Never to know that he going to be at the hospital for a couple of days. RN advised caller that I would document the call. Wife did not need anything further

## 2013-07-22 ENCOUNTER — Telehealth: Payer: Self-pay

## 2013-07-22 NOTE — Telephone Encounter (Signed)
Pt wife called and stated that the patient is not happy with the services the patient is receiving at [facility]. Patient went to the hospital on Monday and is still there, because he was having stomach pain,Diverticulitis. The wife stated the doctors there are saying that the CT scan does show a hole in his stomach. They are treating the patient with IV and antibiotics. Thee wife wants to know if it is possible to have the patient transferred to [facility] and could we help with this.  [phone] Toma Copier

## 2013-07-22 NOTE — Telephone Encounter (Signed)
Wife is informed.

## 2013-07-22 NOTE — Telephone Encounter (Signed)
This would have to be physician to physician and involving his current providers.  If they (pt and family) are interested in this, they would need to discuss their concerns with current attending.

## 2013-07-24 NOTE — Telephone Encounter (Signed)
Pt would like nurse to return his call [phone]. Pt would like appt today

## 2013-07-24 NOTE — Telephone Encounter (Signed)
No fever and no pain, patient will set up appointment next week. If symptoms become worse than patient should go to ER

## 2013-07-27 ENCOUNTER — Encounter: Payer: Self-pay | Admitting: Family Medicine

## 2013-07-27 ENCOUNTER — Ambulatory Visit (INDEPENDENT_AMBULATORY_CARE_PROVIDER_SITE_OTHER): Payer: BC Managed Care – PPO | Admitting: Family Medicine

## 2013-07-27 VITALS — BP 120/80 | HR 101 | Temp 97.5°F | Wt 192.0 lb

## 2013-07-27 DIAGNOSIS — K5732 Diverticulitis of large intestine without perforation or abscess without bleeding: Secondary | ICD-10-CM

## 2013-07-27 LAB — CBC WITH DIFFERENTIAL/PLATELET
Basophils Relative: 0.5 % (ref 0.0–3.0)
Eosinophils Relative: 1.4 % (ref 0.0–5.0)
HCT: 44.4 % (ref 39.0–52.0)
Hemoglobin: 15.4 g/dL (ref 13.0–17.0)
MCV: 90.7 fl (ref 78.0–100.0)
Monocytes Absolute: 0.7 10*3/uL (ref 0.1–1.0)
Neutrophils Relative %: 66.3 % (ref 43.0–77.0)
RBC: 4.9 Mil/uL (ref 4.22–5.81)
WBC: 6.3 10*3/uL (ref 4.5–10.5)

## 2013-07-27 NOTE — Patient Instructions (Signed)
Diverticulitis °A diverticulum is a small pouch or sac on the colon. Diverticulosis is the presence of these diverticula on the colon. Diverticulitis is the irritation (inflammation) or infection of diverticula. °CAUSES  °The colon and its diverticula contain bacteria. If food particles block the tiny opening to a diverticulum, the bacteria inside can grow and cause an increase in pressure. This leads to infection and inflammation and is called diverticulitis. °SYMPTOMS  °· Abdominal pain and tenderness. Usually, the pain is located on the left side of your abdomen. However, it could be located elsewhere. °· Fever. °· Bloating. °· Feeling sick to your stomach (nausea). °· Throwing up (vomiting). °· Abnormal stools. °DIAGNOSIS  °Your caregiver will take a history and perform a physical exam. Since many things can cause abdominal pain, other tests may be necessary. Tests may include: °· Blood tests. °· Urine tests. °· X-ray of the abdomen. °· CT scan of the abdomen. °Sometimes, surgery is needed to determine if diverticulitis or other conditions are causing your symptoms. °TREATMENT  °Most of the time, you can be treated without surgery. Treatment includes: °· Resting the bowels by only having liquids for a few days. As you improve, you will need to eat a low-fiber diet. °· Intravenous (IV) fluids if you are losing body fluids (dehydrated). °· Antibiotic medicines that treat infections may be given. °· Pain and nausea medicine, if needed. °· Surgery if the inflamed diverticulum has burst. °HOME CARE INSTRUCTIONS  °· Try a clear liquid diet (broth, tea, or water for as long as directed by your caregiver). You may then gradually begin a low-fiber diet as tolerated.  °A low-fiber diet is a diet with less than 10 grams of fiber. Choose the foods below to reduce fiber in the diet: °· White breads, cereals, rice, and pasta. °· Cooked fruits and vegetables or soft fresh fruits and vegetables without the skin. °· Ground or  well-cooked tender beef, ham, veal, lamb, pork, or poultry. °· Eggs and seafood. °· After your diverticulitis symptoms have improved, your caregiver may put you on a high-fiber diet. A high-fiber diet includes 14 grams of fiber for every 1000 calories consumed. For a standard 2000 calorie diet, you would need 28 grams of fiber. Follow these diet guidelines to help you increase the fiber in your diet. It is important to slowly increase the amount fiber in your diet to avoid gas, constipation, and bloating. °· Choose whole-grain breads, cereals, pasta, and brown rice. °· Choose fresh fruits and vegetables with the skin on. Do not overcook vegetables because the more vegetables are cooked, the more fiber is lost. °· Choose more nuts, seeds, legumes, dried peas, beans, and lentils. °· Look for food products that have greater than 3 grams of fiber per serving on the Nutrition Facts label. °· Take all medicine as directed by your caregiver. °· If your caregiver has given you a follow-up appointment, it is very important that you go. Not going could result in lasting (chronic) or permanent injury, pain, and disability. If there is any problem keeping the appointment, call to reschedule. °SEEK MEDICAL CARE IF:  °· Your pain does not improve. °· You have a hard time advancing your diet beyond clear liquids. °· Your bowel movements do not return to normal. °SEEK IMMEDIATE MEDICAL CARE IF:  °· Your pain becomes worse. °· You have an oral temperature above 102° F (38.9° C), not controlled by medicine. °· You have repeated vomiting. °· You have bloody or black, tarry stools. °·   Symptoms that brought you to your caregiver become worse or are not getting better. °MAKE SURE YOU:  °· Understand these instructions. °· Will watch your condition. °· Will get help right away if you are not doing well or get worse. °Document Released: 07/04/2005 Document Revised: 12/17/2011 Document Reviewed: 10/30/2010 °[employer]® Patient Information  ©2014 [employer]. ° °

## 2013-07-27 NOTE — Progress Notes (Signed)
  Subjective:    Patient ID: Tim Gordon, male    DOB: Feb 15, 1961, 52 y.o.   MRN: [id number]  HPI Patient seen for hospital follow up Admitted with abdominal pain which came on fairly abruptly. He was admitted this past Monday and discharged Friday. CT scan revealed sigmoid diverticulitis with extraluminal gas in the sigmoid mesocolon consistent with focal perforation. Treated with Cipro and Flagyl and remains on these at this time.  He still has some chills but no fever. CBC at discharge white count 7.5 thousand. His appetite is slowly returning. He still has some mild suprapubic pain but overall improving. He's had relatively normal stools with exception of occasional loose stool. No bloody stools. No nausea or vomiting.  No prior history of diverticulitis or diverticular disease. Generally eats a high-fiber diet. No history of constipation. He has scheduled followup with gastroenterologist tomorrow  Past Medical History  Diagnosis Date  . Blood in stool   . Allergy   . Heart murmur   . Hyperlipidemia   . Migraines    Past Surgical History  Procedure Laterality Date  . Appendectomy    . Vasectomy    . Spine surgery  2001    rupture L4-L5    reports that he has never smoked. He does not have any smokeless tobacco history on file. His alcohol and drug histories are not on file. family history includes Arthritis in his mother; Cancer in his maternal grandmother; Cancer (age of onset: 66) in his father; Heart disease in his mother; Hyperlipidemia in his mother; Hypertension in his father and mother; Stroke in his mother and paternal grandfather. Allergies  Allergen Reactions  . Biaxin [Clarithromycin]     "went right through me"  '    Review of Systems  Constitutional: Positive for chills. Negative for fatigue.  Respiratory: Negative for cough and shortness of breath.   Cardiovascular: Negative for chest pain.  Gastrointestinal: Negative for nausea, vomiting, constipation, blood  in stool and abdominal distention.  Neurological: Negative for dizziness.       Objective:   Physical Exam  Constitutional: He appears well-developed and well-nourished.  Cardiovascular: Normal rate and regular rhythm.   Pulmonary/Chest: Effort normal and breath sounds normal. No respiratory distress. He has no wheezes. He has no rales.  Abdominal: Soft. Bowel sounds are normal. He exhibits no distension and no mass. There is no tenderness. There is no rebound and no guarding.          Assessment & Plan:  Recent acute sigmoid diverticulitis with focal perforation. Patient improving with conservative management. Finish out Flagyl and Cipro. He does have some ongoing chills with no documented fever. Repeat CBC. We discussed dietary management long-term. Low residue diet until acute episode has resolved then high-fiber diet

## 2013-08-02 ENCOUNTER — Other Ambulatory Visit: Payer: Self-pay | Admitting: Family Medicine

## 2013-08-26 ENCOUNTER — Ambulatory Visit
Admission: RE | Admit: 2013-08-26 | Discharge: 2013-08-26 | Disposition: A | Discharge status: Home / Self Care | Payer: BC Managed Care – PPO | Source: Ambulatory Visit | Attending: Gastroenterology | Admitting: Gastroenterology

## 2013-08-26 ENCOUNTER — Other Ambulatory Visit: Payer: Self-pay | Admitting: Gastroenterology

## 2013-08-26 DIAGNOSIS — K5792 Diverticulitis of intestine, part unspecified, without perforation or abscess without bleeding: Secondary | ICD-10-CM

## 2013-08-26 DIAGNOSIS — R1032 Left lower quadrant pain: Secondary | ICD-10-CM

## 2013-08-26 MED ORDER — IOHEXOL 300 MG/ML  SOLN
100.0000 mL | Freq: Once | INTRAMUSCULAR | Status: AC | PRN
  Administered 2013-08-26: 100 mL via INTRAVENOUS

## 2013-08-27 ENCOUNTER — Other Ambulatory Visit: Payer: Self-pay | Admitting: Gastroenterology

## 2013-08-27 DIAGNOSIS — K5720 Diverticulitis of large intestine with perforation and abscess without bleeding: Secondary | ICD-10-CM

## 2013-08-31 ENCOUNTER — Ambulatory Visit (INDEPENDENT_AMBULATORY_CARE_PROVIDER_SITE_OTHER): Payer: BC Managed Care – PPO | Admitting: General Surgery

## 2013-08-31 ENCOUNTER — Other Ambulatory Visit: Payer: BC Managed Care – PPO

## 2013-08-31 ENCOUNTER — Ambulatory Visit (HOSPITAL_COMMUNITY)
Admission: RE | Admit: 2013-08-31 | Discharge: 2013-08-31 | Disposition: A | Discharge status: Home / Self Care | Payer: BC Managed Care – PPO | Source: Ambulatory Visit | Attending: Gastroenterology | Admitting: Gastroenterology

## 2013-08-31 ENCOUNTER — Encounter (HOSPITAL_COMMUNITY): Payer: Self-pay

## 2013-08-31 ENCOUNTER — Encounter (INDEPENDENT_AMBULATORY_CARE_PROVIDER_SITE_OTHER): Payer: Self-pay | Admitting: General Surgery

## 2013-08-31 DIAGNOSIS — R188 Other ascites: Secondary | ICD-10-CM | POA: Insufficient documentation

## 2013-08-31 DIAGNOSIS — K5720 Diverticulitis of large intestine with perforation and abscess without bleeding: Secondary | ICD-10-CM

## 2013-08-31 DIAGNOSIS — K5732 Diverticulitis of large intestine without perforation or abscess without bleeding: Secondary | ICD-10-CM

## 2013-08-31 HISTORY — DX: Diverticulitis of intestine, part unspecified, without perforation or abscess without bleeding: K57.92

## 2013-08-31 MED ORDER — IOHEXOL 300 MG/ML  SOLN
100.0000 mL | Freq: Once | INTRAMUSCULAR | Status: AC | PRN
  Administered 2013-08-31: 100 mL via INTRAVENOUS

## 2013-08-31 NOTE — Patient Instructions (Signed)
Avoid fatty and spicy foods.  Do not overeat.  Stay well-hydrated.  If your symptoms get worse, you will need to be admitted to the hospital and started on IV antibiotics.

## 2013-08-31 NOTE — Progress Notes (Addendum)
Patient ID: Tim Gordon, male   DOB: Mar 22, 1961, 52 y.o.   MRN: [id number]  No chief complaint on file.   HPI Tim Gordon is a 52 y.o. male.  HPI  He is referred by Dr. Loreta Ave for further evaluation and treatment of sigmoid diverticulitis. He was admitted to [facility] on 07/20/2013 with his first episode of acute diverticulitis. There is some extraluminal gas but no abscess. He was started on IV Cipro and Flagyl and was sent home on this. He got better but four days after being off antibiotics, he began having the pain again and restarted the Cipro Flagyl. Basically, he has not been quite able to get over the episode. He had a CT scan last week and 1 again today. The extraluminal gas pocket is slightly smaller today. There is some more inflammation around the bladder. He states last night he had some urinary frequency but it is better today. He's not having any fever. He is tolerating a diet. No blood in his stool. No nausea or vomiting. His energy level is less. Thursday, his antibiotic regimen was changed to Augmentin and Flagyl.  He is here with his wife.  Past Medical History  Diagnosis Date  . Blood in stool   . Allergy   . Heart murmur   . Hyperlipidemia   . Migraines   . Diverticulitis     Past Surgical History  Procedure Laterality Date  . Appendectomy    . Vasectomy    . Spine surgery  2001    rupture L4-L5    Family History  Problem Relation Age of Onset  . Arthritis Mother   . Hyperlipidemia Mother   . Stroke Mother   . Hypertension Mother   . Heart disease Mother     valvular heart disease ? which valve.  . Cancer Father 33    prostate  . Hypertension Father   . Cancer Maternal Grandmother     ovarian  . Stroke Paternal Grandfather     Social History History  Substance Use Topics  . Smoking status: Never Smoker   . Smokeless tobacco: Not on file  . Alcohol Use: Not on file    Allergies  Allergen Reactions  . Biaxin [Clarithromycin]     "went right  through me"    Current Outpatient Prescriptions  Medication Sig Dispense Refill  . albuterol (PROVENTIL HFA;VENTOLIN HFA) 108 (90 BASE) MCG/ACT inhaler Inhale 2 puffs into the lungs every 6 (six) hours as needed for wheezing.  1 Inhaler  0  . azelastine (ASTELIN) 137 MCG/SPRAY nasal spray Place 1 spray into the nose 2 (two) times daily. Use in each nostril as directed  30 mL  12  . ciprofloxacin (CIPRO) 500 MG tablet Take 1 tablet by mouth every 12 hours for 7 days      . clonazePAM (KLONOPIN) 0.5 MG tablet TAKE 1/2 TABLET BY MOUTH AT BEDTIME AS NEEDED  30 tablet  5  . ezetimibe (ZETIA) 10 MG tablet Take 1 tablet (10 mg total) by mouth daily.  90 tablet  3  . fluticasone (FLONASE) 50 MCG/ACT nasal spray Place 2 sprays into the nose daily.  16 g  3  . metroNIDAZOLE (FLAGYL) 500 MG tablet Take 1 tablet by mouth every 8 hours for 7 days      . omega-3 acid ethyl esters (LOVAZA) 1 G capsule TAKE 2 CAPSULES TWICE A DAY  360 capsule  3  . propranolol (INDERAL) 20 MG tablet Take 1 tablet (  20 mg total) by mouth daily.  90 tablet  3   No current facility-administered medications for this visit.    Review of Systems Review of Systems  Constitutional: Negative for fever and chills.  HENT: Negative.   Respiratory: Negative.   Cardiovascular: Negative.   Gastrointestinal: Positive for abdominal pain (Occasional mild lower abdominal pain). Negative for nausea, vomiting and diarrhea.  Genitourinary: Positive for frequency. Negative for difficulty urinating.  Musculoskeletal: Negative.   Neurological: Negative.   Hematological: Negative.     There were no vitals taken for this visit.  Physical Exam Physical Exam  Constitutional: He appears well-developed and well-nourished. No distress.  HENT:  Head: Normocephalic and atraumatic.  Eyes: No scleral icterus.  Neck: Neck supple.  Cardiovascular: Normal rate and regular rhythm.   Pulmonary/Chest: Effort normal and breath sounds normal.   Abdominal: Soft. He exhibits no distension and no mass. There is tenderness (Mild in the suprapubic area). There is no guarding.  Lymphadenopathy:    He has no cervical adenopathy.  Neurological: He is alert.  Skin: Skin is warm and dry.  Psychiatric: He has a normal mood and affect. His behavior is normal.    Data Reviewed Notes from Dr. Loreta Ave. CT scans.  Assessment    Acute sigmoid diverticulitis, first episode-does not appear to have responded completely to ciprofloxacin and metronidazole. Has subsequently been changed to Augmentin and metronidazole. No evidence of intra-abdominal abscess.     Plan    We had a long discussion about the nature of the disease and current clinical practices. Ideally, he would respond to oral antibiotics and then have a colonoscopy. Following this, this he continued to have episodes, he would not need any surgery.  If he had surgery now, his chances of having a colostomy are significantly increased. Thus, we'll try him on his antibiotic regimen. If he gets worse despite this, I told him he would need to be admitted to the hospital and started on IV antibiotics (Zosyn or InVanz) with the hope that he could avoid the urgent surgery and colostomy. As of now, his colonoscopy  is scheduled for December 19.  Will see how he does clinically and have him inform us or Dr. Loreta Ave if he is getting worse or no better.       Aleah Ahlgrim J 08/31/2013, 3:02 PM

## 2013-09-08 ENCOUNTER — Encounter (INDEPENDENT_AMBULATORY_CARE_PROVIDER_SITE_OTHER): Payer: Self-pay | Admitting: General Surgery

## 2013-09-08 NOTE — Progress Notes (Signed)
Patient ID: Tim Gordon, male   DOB: 1961-08-15, 52 y.o.   MRN: [id number] I spoke with Dr. Loreta Ave and then with Tim Gordon. Overall, he is better. He is back at his full schedule at work. He did some  work around the house without discomfort. He's not having any of the pain he was having when I saw him last week for the most part. However, he was on a conference call yesterday and had the urge to have a bowel movement. He tried to hold off on going but then became extremely uncomfortable. Once he had a bowel movement and urinated this discomfort went away. He has no fever. No chills.  I told him we should continue on the current plan. If he does not get better  the options are to be admitted to the hospital and place him on IV antibiotics versus a partial colectomy with a higher likelihood of a colostomy. We discussed the colonoscopy. I told him I feel that he should have a CT scan 2 or 3 days before the colonoscopy just to make sure that the inflammatory process is well under control. I also discussed this with Dr. Loreta Ave. She will be seeing him in 2 days. I think he needs to remain on antibiotics for now.

## 2013-09-15 ENCOUNTER — Other Ambulatory Visit: Payer: Self-pay | Admitting: Family Medicine

## 2013-09-18 ENCOUNTER — Other Ambulatory Visit: Payer: Self-pay | Admitting: Gastroenterology

## 2013-09-18 DIAGNOSIS — K5720 Diverticulitis of large intestine with perforation and abscess without bleeding: Secondary | ICD-10-CM

## 2013-09-21 ENCOUNTER — Encounter (INDEPENDENT_AMBULATORY_CARE_PROVIDER_SITE_OTHER): Payer: Self-pay

## 2013-09-23 ENCOUNTER — Ambulatory Visit
Admission: RE | Admit: 2013-09-23 | Discharge: 2013-09-23 | Disposition: A | Discharge status: Home / Self Care | Payer: BC Managed Care – PPO | Source: Ambulatory Visit | Attending: Gastroenterology | Admitting: Gastroenterology

## 2013-09-23 DIAGNOSIS — K5720 Diverticulitis of large intestine with perforation and abscess without bleeding: Secondary | ICD-10-CM

## 2013-09-23 MED ORDER — IOHEXOL 300 MG/ML  SOLN
100.0000 mL | Freq: Once | INTRAMUSCULAR | Status: AC | PRN
  Administered 2013-09-23: 100 mL via INTRAVENOUS

## 2013-09-25 ENCOUNTER — Encounter (INDEPENDENT_AMBULATORY_CARE_PROVIDER_SITE_OTHER): Payer: Self-pay | Admitting: General Surgery

## 2013-09-25 NOTE — Progress Notes (Signed)
Patient ID: Tim Gordon, male   DOB: 27-Jun-1961, 52 y.o.   MRN: [id number] I reviewed his recent CT scan which showed complete resolution of his diverticulitis.  He had a colonoscopy today which demonstrated diverticulosis with no active inflammatory changes.  Since this is his first episode, I do not feel surgical intervention is indicated.

## 2013-11-16 ENCOUNTER — Other Ambulatory Visit: Payer: Self-pay | Admitting: Family Medicine

## 2014-01-06 ENCOUNTER — Other Ambulatory Visit: Payer: Self-pay | Admitting: Family Medicine

## 2014-01-06 NOTE — Telephone Encounter (Signed)
Refill for 6 months. 

## 2014-01-06 NOTE — Telephone Encounter (Signed)
Last visit 07/27/13 Last refill 07/08/13 #30 5 refill

## 2014-01-21 ENCOUNTER — Ambulatory Visit (INDEPENDENT_AMBULATORY_CARE_PROVIDER_SITE_OTHER): Payer: BC Managed Care – PPO | Admitting: Family Medicine

## 2014-01-21 ENCOUNTER — Encounter: Payer: Self-pay | Admitting: Family Medicine

## 2014-01-21 VITALS — BP 138/90 | HR 70 | Wt 198.0 lb

## 2014-01-21 DIAGNOSIS — G4700 Insomnia, unspecified: Secondary | ICD-10-CM

## 2014-01-21 DIAGNOSIS — E785 Hyperlipidemia, unspecified: Secondary | ICD-10-CM

## 2014-01-21 DIAGNOSIS — F5104 Psychophysiologic insomnia: Secondary | ICD-10-CM

## 2014-01-21 MED ORDER — CLONAZEPAM 0.5 MG PO TABS: 0.5000 mg | ORAL_TABLET | Freq: Every day | ORAL | Status: DC

## 2014-01-21 NOTE — Patient Instructions (Signed)

## 2014-01-21 NOTE — Progress Notes (Signed)
Pre visit review using our clinic review tool, if applicable. No additional management support is needed unless otherwise documented below in the visit note. 

## 2014-01-21 NOTE — Progress Notes (Signed)
   Subjective:    Patient ID: Tim Gordon, male    DOB: Dec 20, 1960, 53 y.o.   MRN: [id number]  HPI Patient here for medical followup. His chronic problems include history of chronic insomnia, hyperlipidemia, essential tremor, migraine headaches, sigmoid diverticulitis. He was traveling in [place] one and one half weeks ago and had flareup of diverticulitis was seen in urgent care and treated with Flagyl and Augmentin and symptoms resolved. He saw a gastroenterologist earlier today.  Chronic insomnia. He has been on clonazepam 0.5 mg one half tablet at night for years. Recently has had some problems with early morning awakening. No depression. Titrated his clonazepam up to one full tablet of 0.5 mg which helped. Sleep hygiene has been discussed in past. Rare alcohol use.  Hyperlipidemia. He has high triglycerides and high cholesterol. He came here on regimen of Lovaza and Zetia.  He does not recall ever being on statin previously. He has not had lipids in over one year. Is not fasting today. No history of CAD or peripheral vascular disease. No family history of premature CAD.  Reviewed with no changes:  Past Medical History  Diagnosis Date  . Blood in stool   . Allergy   . Heart murmur   . Hyperlipidemia   . Migraines   . Diverticulitis    Past Surgical History  Procedure Laterality Date  . Appendectomy    . Vasectomy    . Spine surgery  2001    rupture L4-L5    reports that he has never smoked. He does not have any smokeless tobacco history on file. His alcohol and drug histories are not on file. family history includes Arthritis in his mother; Cancer in his maternal grandmother; Cancer (age of onset: 49) in his father; Heart disease in his mother; Hyperlipidemia in his mother; Hypertension in his father and mother; Stroke in his mother and paternal grandfather. Allergies  Allergen Reactions  . Biaxin [Clarithromycin]     "went right through me"      Review of Systems    Constitutional: Negative for fever, chills and fatigue.  Eyes: Negative for visual disturbance.  Respiratory: Negative for cough, chest tightness and shortness of breath.   Cardiovascular: Negative for chest pain, palpitations and leg swelling.  Neurological: Negative for dizziness, syncope, weakness, light-headedness and headaches.  Psychiatric/Behavioral: Positive for sleep disturbance. Negative for dysphoric mood.       Objective:   Physical Exam  Constitutional: He is oriented to person, place, and time. He appears well-developed and well-nourished.  Neck: Neck supple. No thyromegaly present.  Cardiovascular: Normal rate.   Pulmonary/Chest: Effort normal and breath sounds normal. No respiratory distress. He has no wheezes. He has no rales.  Musculoskeletal: He exhibits no edema.  Neurological: He is alert and oriented to person, place, and time.          Assessment & Plan:  #1 hyperlipidemia. Schedule fasting lipids. We have recommended he consider stopping Zetia and check lipids off this to help clarify his risk. Calculate 10 year risk of CAD after off Zetia for one month and then consider initiating statin if indicated #2 chronic insomnia. Sleep hygiene discussed. Increase clonazepam 0.5 mg each bedtime. Prescription written for 6 months

## 2014-02-26 ENCOUNTER — Other Ambulatory Visit (INDEPENDENT_AMBULATORY_CARE_PROVIDER_SITE_OTHER): Payer: BC Managed Care – PPO

## 2014-02-26 DIAGNOSIS — E785 Hyperlipidemia, unspecified: Secondary | ICD-10-CM

## 2014-02-26 LAB — HEPATIC FUNCTION PANEL
ALT: 28 U/L (ref 0–53)
AST: 20 U/L (ref 0–37)
Albumin: 4.4 g/dL (ref 3.5–5.2)
Alkaline Phosphatase: 55 U/L (ref 39–117)
Bilirubin, Direct: 0.2 mg/dL (ref 0.0–0.3)
TOTAL PROTEIN: 7.5 g/dL (ref 6.0–8.3)
Total Bilirubin: 1.5 mg/dL — ABNORMAL HIGH (ref 0.2–1.2)

## 2014-02-26 LAB — LIPID PANEL
CHOLESTEROL: 224 mg/dL — AB (ref 0–200)
HDL: 36.9 mg/dL — ABNORMAL LOW (ref >39.00)
LDL CALC: 159 mg/dL — AB (ref 0–99)
Total CHOL/HDL Ratio: 6
Triglycerides: 140 mg/dL (ref 0.0–149.0)
VLDL: 28 mg/dL (ref 0.0–40.0)

## 2014-03-02 ENCOUNTER — Encounter: Payer: Self-pay | Admitting: Internal Medicine

## 2014-03-02 ENCOUNTER — Ambulatory Visit (INDEPENDENT_AMBULATORY_CARE_PROVIDER_SITE_OTHER): Payer: BC Managed Care – PPO | Admitting: Internal Medicine

## 2014-03-02 VITALS — BP 140/94 | HR 81 | Temp 98.8°F | Ht 69.25 in | Wt 198.2 lb

## 2014-03-02 DIAGNOSIS — J209 Acute bronchitis, unspecified: Secondary | ICD-10-CM

## 2014-03-02 MED ORDER — DOXYCYCLINE HYCLATE 100 MG PO TABS: 100.0000 mg | ORAL_TABLET | Freq: Two times a day (BID) | ORAL | Status: DC

## 2014-03-02 MED ORDER — HYDROCODONE-HOMATROPINE 5-1.5 MG/5ML PO SYRP: 5.0000 mL | ORAL_SOLUTION | Freq: Four times a day (QID) | ORAL | Status: DC | PRN

## 2014-03-02 NOTE — Progress Notes (Signed)
Subjective:    Patient ID: Tim Gordon, male    DOB: Apr 04, 1961, 53 y.o.   MRN: [id number]  HPI  53 year old patient who travels extensively, including international locations.  He was ill on a recent trip to [place], but symptoms are resolved.  He has been home for about 7 days and had recurrent sore throat and URI symptoms 5 days ago.  His sore throat has improved, but he has had worsening cough, which has become productive of yellow and brown sputum.  Has been unable to work due to the illness and is concerned about his absenteeism and clinical worsening.  Past Medical History  Diagnosis Date  . Blood in stool   . Allergy   . Heart murmur   . Hyperlipidemia   . Migraines   . Diverticulitis     History   Social History  . Marital Status: Married    Spouse Name: N/A    Number of Children: N/A  . Years of Education: N/A   Occupational History  . Not on file.   Social History Main Topics  . Smoking status: Never Smoker   . Smokeless tobacco: Not on file  . Alcohol Use: Not on file  . Drug Use: Not on file  . Sexual Activity: Not on file   Other Topics Concern  . Not on file   Social History Narrative  . No narrative on file    Past Surgical History  Procedure Laterality Date  . Appendectomy    . Vasectomy    . Spine surgery  2001    rupture L4-L5    Family History  Problem Relation Age of Onset  . Arthritis Mother   . Hyperlipidemia Mother   . Stroke Mother   . Hypertension Mother   . Heart disease Mother     valvular heart disease ? which valve.  . Cancer Father 51    prostate  . Hypertension Father   . Cancer Maternal Grandmother     ovarian  . Stroke Paternal Grandfather     Allergies  Allergen Reactions  . Biaxin [Clarithromycin]     "went right through me"    Current Outpatient Prescriptions on File Prior to Visit  Medication Sig Dispense Refill  . clonazePAM (KLONOPIN) 0.5 MG tablet Take 1 tablet (0.5 mg total) by mouth at bedtime.  90  tablet  1  . omega-3 acid ethyl esters (LOVAZA) 1 G capsule TAKE 2 CAPSULES TWICE A DAY  360 capsule  3  . propranolol (INDERAL) 20 MG tablet TAKE 1 TABLET DAILY  90 tablet  2   No current facility-administered medications on file prior to visit.    BP 140/94  Pulse 81  Temp(Src) 98.8 F (37.1 C) (Oral)  Ht 5' 9.25" (1.759 m)  Wt 198 lb 3.2 oz (89.903 kg)  BMI 29.06 kg/m2  SpO2 98%     Review of Systems  Constitutional: Positive for activity change, appetite change and fatigue. Negative for fever and chills.  HENT: Positive for congestion and sore throat. Negative for dental problem, ear pain, hearing loss, tinnitus, trouble swallowing and voice change.   Eyes: Negative for pain, discharge and visual disturbance.  Respiratory: Positive for cough. Negative for chest tightness, wheezing and stridor.   Cardiovascular: Negative for chest pain, palpitations and leg swelling.  Gastrointestinal: Negative for nausea, vomiting, abdominal pain, diarrhea, constipation, blood in stool and abdominal distention.  Genitourinary: Negative for urgency, hematuria, flank pain, discharge, difficulty urinating and genital sores.  Musculoskeletal: Negative for arthralgias, back pain, gait problem, joint swelling, myalgias and neck stiffness.  Skin: Negative for rash.  Neurological: Negative for dizziness, syncope, speech difficulty, weakness, numbness and headaches.  Hematological: Negative for adenopathy. Does not bruise/bleed easily.  Psychiatric/Behavioral: Negative for behavioral problems and dysphoric mood. The patient is not nervous/anxious.        Objective:   Physical Exam  Constitutional: He is oriented to person, place, and time. He appears well-developed.  HENT:  Head: Normocephalic.  Right Ear: External ear normal.  Left Ear: External ear normal.  Oropharynx mildly erythematous  Eyes: Conjunctivae and EOM are normal.  Neck: Normal range of motion.  Cardiovascular: Normal rate and  normal heart sounds.   Pulmonary/Chest: Effort normal and breath sounds normal. No respiratory distress. He has no wheezes. He has no rales.  Abdominal: Bowel sounds are normal.  Musculoskeletal: Normal range of motion. He exhibits no edema and no tenderness.  Neurological: He is alert and oriented to person, place, and time.  Psychiatric: He has a normal mood and affect. His behavior is normal.          Assessment & Plan:   Acute bronchitis, probably viral.  The patient will be treated symptomatically.  He was given an antibiotic to consider taking if out of town with clinical worsening.  He was urged to treat symptomatically only at this time and to hold antibiotic therapy unless he worsens

## 2014-03-02 NOTE — Patient Instructions (Signed)
Acute bronchitis symptoms for less than 10 days are generally not helped by antibiotics.  Take over-the-counter expectorants and cough medications such as  Mucinex DM.  Call if there is no improvement in 5 to 7 days or if  you develop worsening cough, fever, or new symptoms, such as shortness of breath or chest pain.   

## 2014-03-02 NOTE — Progress Notes (Signed)
Pre visit review using our clinic review tool, if applicable. No additional management support is needed unless otherwise documented below in the visit note. 

## 2014-03-16 ENCOUNTER — Telehealth: Payer: Self-pay | Admitting: Family Medicine

## 2014-03-16 NOTE — Telephone Encounter (Signed)
Pt states he has not received a call back yet regarding his labs, states dr. Elease Hashimoto informed pt that he would call him to advise him if he should continue using rx zetia.  Pt also would like to know why was his labs sent to [facility], pt states his labs always go to [facility], not sure if it was done in error, however pt states his insurance does not participate with them and he does not want to be billed.  Pt received is EOB from his insurance company and it stated the labs came from [facility]

## 2014-03-16 NOTE — Telephone Encounter (Signed)
Pt was informed about lab results done on 02/26/14 . Left message that he should not have received a bill from toxicology. Should the patient continue taking Zetia.

## 2014-03-16 NOTE — Telephone Encounter (Signed)
Continue with taking Zetia.

## 2014-03-17 ENCOUNTER — Encounter: Payer: Self-pay | Admitting: Family Medicine

## 2014-03-17 NOTE — Telephone Encounter (Signed)
Patient would like for you to give him a call about why is he still having to take Zetia.

## 2014-03-18 NOTE — Telephone Encounter (Signed)
Called patient and patient stated that he would call me back.

## 2014-03-18 NOTE — Telephone Encounter (Signed)
i tried to call pt and left message.  He was placed on Zetia before he came here.  He has 8% risk of CAD over 10 years by ATP risk calculator and guidelines suggest anti-lipid therapy for > 7.5%.  I think better prevention than Zetia would be to stop Zetia and start Lipitor 10 mg once daily and repeat lipid and hepatic in 2 months.

## 2014-03-26 ENCOUNTER — Ambulatory Visit (INDEPENDENT_AMBULATORY_CARE_PROVIDER_SITE_OTHER): Payer: BC Managed Care – PPO | Admitting: Family Medicine

## 2014-03-26 ENCOUNTER — Encounter: Payer: Self-pay | Admitting: Family Medicine

## 2014-03-26 VITALS — BP 132/108 | HR 68 | Temp 98.4°F | Ht 68.75 in | Wt 197.0 lb

## 2014-03-26 DIAGNOSIS — Z0000 Encounter for general adult medical examination without abnormal findings: Secondary | ICD-10-CM

## 2014-03-26 LAB — PSA: PSA: 1.08 ng/mL (ref 0.10–4.00)

## 2014-03-26 NOTE — Progress Notes (Signed)
Subjective:    Patient ID: Tim Gordon, male    DOB: Mar 02, 1961, 53 y.o.   MRN: [id number]  HPI Complete physical. Patient has history of migraine headaches, dyslipidemia, essential tremor, and diverticulitis. Complicated course with diverticulitis last fall. He had repeat colonoscopy last December. Tetanus up-to-date. He had several labs done recently through work. He made some lifestyle changes recently and triglycerides have improved. He had been on Zetia.  10 year risk of CAD event is 8% by recent calculation. He prefers not to take antilipid medication at this time  Past Medical History  Diagnosis Date  . Blood in stool   . Allergy   . Heart murmur   . Hyperlipidemia   . Migraines   . Diverticulitis    Past Surgical History  Procedure Laterality Date  . Appendectomy    . Vasectomy    . Spine surgery  2001    rupture L4-L5    reports that he has never smoked. He does not have any smokeless tobacco history on file. His alcohol and drug histories are not on file. family history includes Arthritis in his mother; Cancer in his maternal grandmother; Cancer (age of onset: 69) in his father; Heart disease in his mother; Hyperlipidemia in his mother; Hypertension in his father and mother; Stroke in his mother and paternal grandfather. Allergies  Allergen Reactions  . Biaxin [Clarithromycin]     "went right through me"      Review of Systems  Constitutional: Negative for fever, activity change, appetite change and fatigue.  HENT: Negative for congestion, ear pain and trouble swallowing.   Eyes: Negative for pain and visual disturbance.  Respiratory: Negative for cough, shortness of breath and wheezing.   Cardiovascular: Negative for chest pain and palpitations.  Gastrointestinal: Negative for nausea, vomiting, abdominal pain, diarrhea, constipation, blood in stool, abdominal distention and rectal pain.  Genitourinary: Negative for dysuria, hematuria and testicular pain.    Musculoskeletal: Negative for arthralgias and joint swelling.  Skin: Negative for rash.  Neurological: Negative for dizziness, syncope and headaches.  Hematological: Negative for adenopathy.  Psychiatric/Behavioral: Negative for confusion and dysphoric mood.       Objective:   Physical Exam  Constitutional: He is oriented to person, place, and time. He appears well-developed and well-nourished. No distress.  HENT:  Head: Normocephalic and atraumatic.  Right Ear: External ear normal.  Left Ear: External ear normal.  Mouth/Throat: Oropharynx is clear and moist.  Eyes: Conjunctivae and EOM are normal. Pupils are equal, round, and reactive to light.  Neck: Normal range of motion. Neck supple. No thyromegaly present.  Cardiovascular: Normal rate, regular rhythm and normal heart sounds.   No murmur heard. Pulmonary/Chest: No respiratory distress. He has no wheezes. He has no rales.  Abdominal: Soft. Bowel sounds are normal. He exhibits no distension and no mass. There is no tenderness. There is no rebound and no guarding.  Musculoskeletal: He exhibits no edema.  Lymphadenopathy:    He has no cervical adenopathy.  Neurological: He is alert and oriented to person, place, and time. He displays normal reflexes. No cranial nerve deficit.  Skin: No rash noted.  Psychiatric: He has a normal mood and affect.          Assessment & Plan:  Complete physical. Add PSA with positive family history of prostate cancer. Handout triglycerides given.  Discussed healthy lifestyle habits. Establish more consistent exercise. Monitor blood pressures closely. After long discussion, he has decided against any lipid therapy at this  time after discussing risks and benefits. He has had previous flushing with niacin

## 2014-03-26 NOTE — Patient Instructions (Signed)
Triglycerides, TG, TRIG This is a test to check your risk of developing heart disease. It is often done as part of a lipid profile during a regular medical exam or if you are being treated for high triglycerides. This test measures the amount of triglycerides in your blood. Triglycerides are the body's storage form for fat. Most triglycerides are found in fat tissue. Some triglycerides circulate in the blood to provide fuel for muscles to work. Extra triglycerides are found in the blood after eating a meal when fat is being sent from the gut to fat tissue for storage. The test for triglycerides should be done when you are fasting and no extra triglycerides from a recent meal are present.  SAMPLE COLLECTION The test for triglycerides uses a blood sample. Most often, the blood sample is collected using a needle to collect blood from a vein. Sometimes triglycerides are measured using a drop of blood collected by puncturing the skin on a finger. Testing should be done when you are fasting. For 12 to 14 hours before the test, only water is permitted. In addition, alcohol should not be consumed for the 24 hours just before the test. If you are diabetic and your blood sugar is out of control, triglycerides will be very high. NORMAL FINDINGS  Adult/elderly  Male: 40-160 mg/dL or 0.45-1.81 mmol/L (SI units)  Male: 35-135 mg/dL or 0.40-1.52 mmol/L (SI units)  0-52 years  Male: 30-86 mg/dL  Male: 32-99 mg/dL  6-52 years  Male: 31-108 mg/dL  Male: 35-114 mg/dL  12-52 years  Male: 36-138 mg/dL  Male: 41-138 mg/dL  16-52 years  Male: 40-163 mg/dL  Male: 40-128 mg/dL Ranges for normal findings may vary among different laboratories and hospitals. You should always check with your doctor after having lab work or other tests done to discuss the meaning of your test results and whether your values are considered within normal limits. MEANING OF TEST  Your caregiver will go over the test  results with you and discuss the importance and meaning of your results, as well as treatment options and the need for additional tests if necessary. OBTAINING THE TEST RESULTS It is your responsibility to obtain your test results. Ask the lab or department performing the test when and how you will get your results. Document Released: 10/27/2004 Document Revised: 12/17/2011 Document Reviewed: 09/05/2008 [facility] Patient Information 2015 [place], [place]. This information is not intended to replace advice given to you by your health care provider. Make sure you discuss any questions you have with your health care provider.  Food Choices to Lower Your Triglycerides  Triglycerides are a type of fat in your blood. High levels of triglycerides can increase the risk of heart disease and stroke. If your triglyceride levels are high, the foods you eat and your eating habits are very important. Choosing the right foods can help lower your triglycerides.  WHAT GENERAL GUIDELINES DO I NEED TO FOLLOW?  Lose weight if you are overweight.   Limit or avoid alcohol.   Fill one half of your plate with vegetables and green salads.   Limit fruit to two servings a day. Choose fruit instead of juice.   Make one fourth of your plate whole grains. Look for the word "whole" as the first word in the ingredient list.  Fill one fourth of your plate with lean protein foods.  Enjoy fatty fish (such as salmon, mackerel, sardines, and tuna) three times a week.   Choose healthy fats.   Limit foods  high in starch and sugar.  Eat more home-cooked food and less restaurant, buffet, and fast food.  Limit fried foods.  Cook foods using methods other than frying.  Limit saturated fats.  Check ingredient lists to avoid foods with partially hydrogenated oils (trans fats) in them. WHAT FOODS CAN I EAT?  Grains Whole grains, such as whole wheat or whole grain breads, crackers, cereals, and pasta. Unsweetened  oatmeal, bulgur, barley, quinoa, or brown rice. Corn or whole wheat flour tortillas.  Vegetables Fresh or frozen vegetables (raw, steamed, roasted, or grilled). Green salads. Fruits All fresh, canned (in natural juice), or frozen fruits. Meat and Other Protein Products Ground beef (85% or leaner), grass-fed beef, or beef trimmed of fat. Skinless chicken or [place]. Ground chicken or [place]. Pork trimmed of fat. All fish and seafood. Eggs. Dried beans, peas, or lentils. Unsalted nuts or seeds. Unsalted canned or dry beans. Dairy Low-fat dairy products, such as skim or 1% milk, 2% or reduced-fat cheeses, low-fat ricotta or cottage cheese, or plain low-fat yogurt. Fats and Oils Tub margarines without trans fats. Light or reduced-fat mayonnaise and salad dressings. Avocado. Safflower, olive, or canola oils. Natural peanut or almond butter. The items listed above may not be a complete list of recommended foods or beverages. Contact your dietitian for more options. WHAT FOODS ARE NOT RECOMMENDED?  Grains White bread. White pasta. White rice. Cornbread. Bagels, pastries, and croissants. Crackers that contain trans fat. Vegetables White potatoes. Corn. Creamed or fried vegetables. Vegetables in a cheese sauce. Fruits Dried fruits. Canned fruit in light or heavy syrup. Fruit juice. Meat and Other Protein Products Fatty cuts of meat. Ribs, chicken wings, bacon, sausage, bologna, salami, chitterlings, fatback, hot dogs, bratwurst, and packaged luncheon meats. Dairy Whole or 2% milk, cream, half-and-half, and cream cheese. Whole-fat or sweetened yogurt. Full-fat cheeses. Nondairy creamers and whipped toppings. Processed cheese, cheese spreads, or cheese curds. Sweets and Desserts Corn syrup, sugars, honey, and molasses. Candy. Jam and jelly. Syrup. Sweetened cereals. Cookies, pies, cakes, donuts, muffins, and ice cream. Fats and Oils Butter, stick margarine, lard, shortening, ghee, or bacon fat.  Coconut, palm kernel, or palm oils. Beverages Alcohol. Sweetened drinks (such as sodas, lemonade, and fruit drinks or punches). The items listed above may not be a complete list of foods and beverages to avoid. Contact your dietitian for more information. Document Released: 07/12/2004 Document Revised: 09/29/2013 Document Reviewed: 07/29/2013 [facility] Patient Information 2015 [place], [place]. This information is not intended to replace advice given to you by your health care provider. Make sure you discuss any questions you have with your health care provider.

## 2014-03-26 NOTE — Progress Notes (Signed)
Pre visit review using our clinic review tool, if applicable. No additional management support is needed unless otherwise documented below in the visit note. 

## 2014-05-01 ENCOUNTER — Emergency Department (HOSPITAL_COMMUNITY): Payer: BC Managed Care – PPO

## 2014-05-01 ENCOUNTER — Emergency Department (HOSPITAL_COMMUNITY)
Admission: EM | Admit: 2014-05-01 | Discharge: 2014-05-01 | Disposition: A | Discharge status: Home / Self Care | Payer: BC Managed Care – PPO | Attending: Emergency Medicine | Admitting: Emergency Medicine

## 2014-05-01 ENCOUNTER — Encounter (HOSPITAL_COMMUNITY): Payer: Self-pay | Admitting: Emergency Medicine

## 2014-05-01 DIAGNOSIS — E785 Hyperlipidemia, unspecified: Secondary | ICD-10-CM | POA: Insufficient documentation

## 2014-05-01 DIAGNOSIS — Z79899 Other long term (current) drug therapy: Secondary | ICD-10-CM | POA: Insufficient documentation

## 2014-05-01 DIAGNOSIS — Z8719 Personal history of other diseases of the digestive system: Secondary | ICD-10-CM | POA: Insufficient documentation

## 2014-05-01 DIAGNOSIS — Z8679 Personal history of other diseases of the circulatory system: Secondary | ICD-10-CM | POA: Insufficient documentation

## 2014-05-01 DIAGNOSIS — R011 Cardiac murmur, unspecified: Secondary | ICD-10-CM | POA: Insufficient documentation

## 2014-05-01 DIAGNOSIS — M549 Dorsalgia, unspecified: Secondary | ICD-10-CM | POA: Insufficient documentation

## 2014-05-01 DIAGNOSIS — N201 Calculus of ureter: Secondary | ICD-10-CM

## 2014-05-01 DIAGNOSIS — R109 Unspecified abdominal pain: Secondary | ICD-10-CM | POA: Insufficient documentation

## 2014-05-01 LAB — URINALYSIS, ROUTINE W REFLEX MICROSCOPIC
Bilirubin Urine: NEGATIVE
Glucose, UA: NEGATIVE mg/dL
Ketones, ur: NEGATIVE mg/dL
Leukocytes, UA: NEGATIVE
Nitrite: NEGATIVE
PROTEIN: NEGATIVE mg/dL
Specific Gravity, Urine: 1.023 (ref 1.005–1.030)
UROBILINOGEN UA: 0.2 mg/dL (ref 0.0–1.0)
pH: 7 (ref 5.0–8.0)

## 2014-05-01 LAB — BASIC METABOLIC PANEL
ANION GAP: 12 (ref 5–15)
BUN: 19 mg/dL (ref 6–23)
CALCIUM: 9.1 mg/dL (ref 8.4–10.5)
CHLORIDE: 105 meq/L (ref 96–112)
CO2: 27 meq/L (ref 19–32)
Creatinine, Ser: 1.11 mg/dL (ref 0.50–1.35)
GFR calc Af Amer: 87 mL/min — ABNORMAL LOW (ref >90)
GFR calc non Af Amer: 75 mL/min — ABNORMAL LOW (ref >90)
Glucose, Bld: 87 mg/dL (ref 70–99)
Potassium: 4.1 mEq/L (ref 3.7–5.3)
SODIUM: 144 meq/L (ref 137–147)

## 2014-05-01 LAB — CBC WITH DIFFERENTIAL/PLATELET
Basophils Absolute: 0 10*3/uL (ref 0.0–0.1)
Basophils Relative: 0 % (ref 0–1)
Eosinophils Absolute: 0.1 10*3/uL (ref 0.0–0.7)
Eosinophils Relative: 1 % (ref 0–5)
HCT: 44.3 % (ref 39.0–52.0)
Hemoglobin: 15.1 g/dL (ref 13.0–17.0)
LYMPHS PCT: 20 % (ref 12–46)
Lymphs Abs: 1 10*3/uL (ref 0.7–4.0)
MCH: 31.5 pg (ref 26.0–34.0)
MCHC: 34.1 g/dL (ref 30.0–36.0)
MCV: 92.3 fL (ref 78.0–100.0)
Monocytes Absolute: 0.4 10*3/uL (ref 0.1–1.0)
Monocytes Relative: 8 % (ref 3–12)
NEUTROS ABS: 3.7 10*3/uL (ref 1.7–7.7)
Neutrophils Relative %: 71 % (ref 43–77)
PLATELETS: 182 10*3/uL (ref 150–400)
RBC: 4.8 MIL/uL (ref 4.22–5.81)
RDW: 13 % (ref 11.5–15.5)
WBC: 5.3 10*3/uL (ref 4.0–10.5)

## 2014-05-01 LAB — URINE MICROSCOPIC-ADD ON

## 2014-05-01 MED ORDER — OXYCODONE-ACETAMINOPHEN 5-325 MG PO TABS: 1.0000 | ORAL_TABLET | ORAL | Status: DC | PRN

## 2014-05-01 MED ORDER — KETOROLAC TROMETHAMINE 30 MG/ML IJ SOLN
30.0000 mg | Freq: Once | INTRAMUSCULAR | Status: AC
  Administered 2014-05-01: 30 mg via INTRAVENOUS
  Filled 2014-05-01: qty 1

## 2014-05-01 MED ORDER — ONDANSETRON HCL 4 MG/2ML IJ SOLN
4.0000 mg | Freq: Once | INTRAMUSCULAR | Status: AC
  Administered 2014-05-01: 4 mg via INTRAVENOUS
  Filled 2014-05-01: qty 2

## 2014-05-01 MED ORDER — TAMSULOSIN HCL 0.4 MG PO CAPS: 0.4000 mg | ORAL_CAPSULE | Freq: Every day | ORAL | Status: DC

## 2014-05-01 MED ORDER — ONDANSETRON HCL 4 MG PO TABS: 4.0000 mg | ORAL_TABLET | Freq: Four times a day (QID) | ORAL | Status: DC

## 2014-05-01 NOTE — ED Notes (Signed)
Pt reports onset of left flank pain and back pain this am,  No injury to back. Pain radiates around to abd. Denies pain or difficulty urinating today.

## 2014-05-01 NOTE — ED Notes (Signed)
Family at bedside. 

## 2014-05-01 NOTE — ED Provider Notes (Signed)
CSN: [id number]     Arrival date & time 05/01/14  1137 History   First MD Initiated Contact with Patient 05/01/14 1232     Chief Complaint  Patient presents with  . Flank Pain  . Back Pain     (Consider location/radiation/quality/duration/timing/severity/associated sxs/prior Treatment) Patient is a 53 y.o. male presenting with flank pain and back pain. The history is provided by the patient and medical records.  Flank Pain  Back Pain  This is a 53 year old male with past history significant for hyperlipidemia, migraine headaches, diverticulitis with prior abscess and bowel perforation, presenting to the ED for left flank pain, sudden onset this morning. Patient any recent injury, trauma, or falls.  States pain is sharp, radiation to left mid abdomen with associated nausea and sensation to urinate frequently. Patient denies any dysuria or hematuria. He denies any difficulty voiding. Patient has a prior history of kidney stones, however he does have a family history of them. He denies any fever or chills.  No vomiting.  Abdominal surgeries include appendectomy.  Pt is s/p vasectomy.  Past Medical History  Diagnosis Date  . Blood in stool   . Allergy   . Heart murmur   . Hyperlipidemia   . Migraines   . Diverticulitis    Past Surgical History  Procedure Laterality Date  . Appendectomy    . Vasectomy    . Spine surgery  2001    rupture L4-L5   Family History  Problem Relation Age of Onset  . Arthritis Mother   . Hyperlipidemia Mother   . Stroke Mother   . Hypertension Mother   . Heart disease Mother     valvular heart disease ? which valve.  . Cancer Father 60    prostate  . Hypertension Father   . Cancer Maternal Grandmother     ovarian  . Stroke Paternal Grandfather    History  Substance Use Topics  . Smoking status: Never Smoker   . Smokeless tobacco: Not on file  . Alcohol Use: Not on file    Review of Systems  Genitourinary: Positive for flank pain.  All  other systems reviewed and are negative.     Allergies  Biaxin  Home Medications   Prior to Admission medications   Medication Sig Start Date End Date Taking? Authorizing Provider  cetirizine (KLS ALLER-TEC) 10 MG tablet Take 10 mg by mouth daily.   Yes Historical Provider, MD  clonazePAM (KLONOPIN) 0.5 MG tablet Take 1 tablet (0.5 mg total) by mouth at bedtime. 01/21/14  Yes Eulas Post, MD  omega-3 acid ethyl esters (LOVAZA) 1 G capsule Take 2 g by mouth 2 (two) times daily.   Yes Historical Provider, MD  Pantothenic Acid 250 MG CAPS Take 250 mg by mouth 2 (two) times daily.   Yes Historical Provider, MD  propranolol (INDERAL) 20 MG tablet Take 20 mg by mouth 3 (three) times daily.   Yes Historical Provider, MD  vitamin B-12 (CYANOCOBALAMIN) 1000 MCG tablet Take 1,000 mcg by mouth daily.   Yes Historical Provider, MD   BP 147/93  Pulse 53  Temp(Src) 97.6 F (36.4 C) (Oral)  Resp 21  SpO2 99%  Physical Exam  Nursing note and vitals reviewed. Constitutional: He is oriented to person, place, and time. He appears well-developed and well-nourished.  Tossing and turning in bed, appears uncomfortable  HENT:  Head: Normocephalic and atraumatic.  Mouth/Throat: Oropharynx is clear and moist.  Eyes: Conjunctivae and EOM are normal. Pupils are  equal, round, and reactive to light.  Neck: Normal range of motion.  Cardiovascular: Normal rate, regular rhythm and normal heart sounds.   Pulmonary/Chest: Effort normal and breath sounds normal.  Abdominal: Soft. Bowel sounds are normal. There is no tenderness. There is CVA tenderness (left).  Abdomen soft, nondistended, no peritoneal signs Mild left CVA tenderness with some radiation to left lateral abdomen  Musculoskeletal: Normal range of motion.  Neurological: He is alert and oriented to person, place, and time.  Skin: Skin is warm and dry.  Psychiatric: He has a normal mood and affect.    ED Course  Procedures (including  critical care time) Labs Review Labs Reviewed  URINALYSIS, ROUTINE W REFLEX MICROSCOPIC - Abnormal; Notable for the following:    APPearance CLOUDY (*)    Hgb urine dipstick MODERATE (*)    All other components within normal limits  BASIC METABOLIC PANEL - Abnormal; Notable for the following:    GFR calc non Af Amer 75 (*)    GFR calc Af Amer 87 (*)    All other components within normal limits  URINE MICROSCOPIC-ADD ON  CBC WITH DIFFERENTIAL    Imaging Review Ct Abdomen Pelvis Wo Contrast  05/01/2014   CLINICAL DATA:  Left flank pain.  Hematuria.  Nausea.  EXAM: CT ABDOMEN AND PELVIS WITHOUT CONTRAST  TECHNIQUE: Multidetector CT imaging of the abdomen and pelvis was performed following the standard protocol without IV contrast.  COMPARISON:  CT of the abdomen and pelvis 08/26/2013.  FINDINGS: Lung Bases: Unremarkable.  Abdomen/Pelvis: There is a 3 mm calculus at or immediately beyond the left ureterovesicular junction (images 81-82 of series 2). This is associated with very mild fullness of the left ureter and left renal collecting system, without frank hydronephrosis. However, there is extensive left perinephric stranding suggesting resolving left-sided urinary tract obstruction. No additional calculi are noted within the collecting system of either kidney, along the course of either ureter or elsewhere within the lumen of the urinary bladder. The unenhanced appearance of the kidneys is otherwise unremarkable bilaterally.  The unenhanced appearance of the liver, gallbladder, pancreas, spleen and bilateral adrenal glands is unremarkable. Numerous colonic diverticulae are noted, without surrounding inflammatory changes to suggest an acute diverticulitis at this time. No significant volume of ascites. No pneumoperitoneum. No pathologic distention of small bowel. No lymphadenopathy identified in the abdomen or pelvis on today's non contrast CT examination. Atherosclerosis in the abdominal and pelvic  vasculature, without evidence of aneurysm. Prostate gland is unremarkable in appearance. Bilateral vasectomy clips are noted.  Musculoskeletal: There are no aggressive appearing lytic or blastic lesions noted in the visualized portions of the skeleton.  IMPRESSION: 1. 3 mm stone at or immediately beyond the left ureterovesicular junction. There is only mild fullness of the left ureter and left renal collecting system at this time, however, there is extensive left perinephric stranding. This stone is favored to be within the lumen of the urinary bladder at this time, with imaging findings indicative of recently relieved obstruction. No additional calculi are otherwise noted. 2. Additional incidental findings, as above.   Electronically Signed   By: Vinnie Langton M.D.   On: 05/01/2014 15:01     EKG Interpretation None      MDM   Final diagnoses:  Ureteral calculus   72-year-old male with sudden onset of left flank pain. No associated fever, chills, or urinary symptoms.  Basic labs were obtained which are reassuring. UA noninfectious. CT revealing 3 mm stone at left UVJ,  findings consistent with recently relieved obstruction. No other calculi noted.  After Toradol and Zofran, patient is resting comfortably in bed. He remains afebrile and nontoxic-appearing, VS stable. He'll be discharged home with urine strainer, percocet, zofran, and Flomax. He will follow up with [facility].  Discussed plan with patient, he/she acknowledged understanding and agreed with plan of care.  Return precautions given for new or worsening symptoms.  Larene Pickett, PA-C 05/01/14 1836

## 2014-05-01 NOTE — ED Notes (Signed)
Patient is alert and orientedx4.  Patient was explained discharge instructions and they understood them with no questions.  The patient's significant other, Abdulhadi Stopa is taking the patient home.

## 2014-05-01 NOTE — Discharge Instructions (Signed)
Take the prescribed medication as directed. Strain urine to monitor for passage of stone. Follow-up with [facility]. Return to the ED for new or worsening symptoms.

## 2014-05-01 NOTE — ED Notes (Signed)
Patient transported to CT 

## 2014-05-02 NOTE — ED Provider Notes (Signed)
Medical screening examination/treatment/procedure(s) were performed by non-physician practitioner and as supervising physician I was immediately available for consultation/collaboration.    Dot Lanes, MD 05/02/14 [phone]

## 2014-05-13 ENCOUNTER — Ambulatory Visit (INDEPENDENT_AMBULATORY_CARE_PROVIDER_SITE_OTHER): Payer: BC Managed Care – PPO | Admitting: General Surgery

## 2014-05-13 DIAGNOSIS — K5732 Diverticulitis of large intestine without perforation or abscess without bleeding: Secondary | ICD-10-CM

## 2014-05-13 NOTE — Progress Notes (Signed)
Patient ID: Tim Gordon, male   DOB: 07/20/1961, 53 y.o.   MRN: [id number]  No chief complaint on file.   HPI Tim Gordon is a 53 y.o. male.   HPI  Tim Gordon comes in today because he has been having recurrent episodes of sigmoid diverticulitis. Since his colonoscopy in December 2014, he states he has had 4-5 episodes. Also, recently, he presented to the emergency department on July 25 and was found to have a left sided kidney stone that was felt to pass into his bladder based on a CT. The size of the kidney stone is 3 mm.  He is not aware that he has passed the stone. He does not have an appointment with a Urologist.  He currently is taking Augmentin and Flagyl along with the rest of his medications.  He is here with his wife.  Past Medical History  Diagnosis Date  . Blood in stool   . Allergy   . Heart murmur   . Hyperlipidemia   . Migraines   . Diverticulitis     Past Surgical History  Procedure Laterality Date  . Appendectomy    . Vasectomy    . Spine surgery  2001    rupture L4-L5    Family History  Problem Relation Age of Onset  . Arthritis Mother   . Hyperlipidemia Mother   . Stroke Mother   . Hypertension Mother   . Heart disease Mother     valvular heart disease ? which valve.  . Cancer Father 39    prostate  . Hypertension Father   . Cancer Maternal Grandmother     ovarian  . Stroke Paternal Grandfather     Social History History  Substance Use Topics  . Smoking status: Never Smoker   . Smokeless tobacco: Not on file  . Alcohol Use: Not on file    Allergies  Allergen Reactions  . Biaxin [Clarithromycin] Other (See Comments)    "went right through me"    Current Outpatient Prescriptions  Medication Sig Dispense Refill  . cetirizine (KLS ALLER-TEC) 10 MG tablet Take 10 mg by mouth daily.      . clonazePAM (KLONOPIN) 0.5 MG tablet Take 1 tablet (0.5 mg total) by mouth at bedtime.  90 tablet  1  . omega-3 acid ethyl esters (LOVAZA) 1 G capsule Take 2  g by mouth 2 (two) times daily.      . ondansetron (ZOFRAN) 4 MG tablet Take 1 tablet (4 mg total) by mouth every 6 (six) hours.  12 tablet  0  . oxyCODONE-acetaminophen (PERCOCET/ROXICET) 5-325 MG per tablet Take 1 tablet by mouth every 4 (four) hours as needed.  15 tablet  0  . Pantothenic Acid 250 MG CAPS Take 250 mg by mouth 2 (two) times daily.      . propranolol (INDERAL) 20 MG tablet Take 20 mg by mouth 3 (three) times daily.      . tamsulosin (FLOMAX) 0.4 MG CAPS capsule Take 1 capsule (0.4 mg total) by mouth daily after supper.  5 capsule  0  . vitamin B-12 (CYANOCOBALAMIN) 1000 MCG tablet Take 1,000 mcg by mouth daily.       No current facility-administered medications for this visit.    Review of Systems Review of Systems  Constitutional: Positive for chills. Negative for fever.  Gastrointestinal: Negative for abdominal pain (not currently).  Genitourinary: Negative for hematuria.    There were no vitals taken for this visit.  Physical Exam  Physical Exam  Constitutional: He appears well-developed and well-nourished. No distress.  HENT:  Head: Normocephalic and atraumatic.  Eyes: No scleral icterus.  Cardiovascular: Normal rate and regular rhythm.   Pulmonary/Chest: Effort normal and breath sounds normal.  Abdominal: Soft. He exhibits no mass. There is no tenderness.  RLQ scar  Musculoskeletal: He exhibits no edema.  Lymphadenopathy:    He has no cervical adenopathy.  Neurological: He is alert.  Skin: Skin is warm and dry.  Psychiatric: He has a normal mood and affect. His behavior is normal.    Data Reviewed Colonoscopy report. Previous note. Recent CT scan showing a slightly dilated but not obstructed left ureter some slight haziness around the left kidney. There is felt to be a small stone in the bladder.  Assessment    1. Recurrent sigmoid diverticulitis-episodes have been able to be controlled with antibiotics.  2. Recent left-sided kidney stone which is  felt to be in the bladder the     Plan    I recommend a laparoscopic possible open partial colectomy due to the recurrent sigmoid diverticulitis. Also, will arrange for him to see a urologist regarding the kidney stone.  I have explained the procedure and risks of colon resection.  Risks include but are not limited to bleeding, infection, wound problems, anesthesia, anastomotic leak, need for colostomy, need for reoperative surgery,  injury to intraabominal organs (such as intestine, spleen, kidney, bladder, ureter, etc.), ileus, irregular bowel habits.  He seems to understand and agrees with the plan.        Tim Gordon J 05/13/2014, 10:45 AM

## 2014-05-13 NOTE — Patient Instructions (Signed)
[facility]  ONE-DAY (1) PRE-OP HOME COLON PREP INSTRUCTIONS: ** MIRALAX / GATORADE PREP ** Keep taking your current antibiotics up to the day of surgery.  Take an over the counter probiotic once a day as well.    Fill the two prescriptions at a pharmacy of your choice.  You must follow the instructions below carefully.  If you have questions or problems, please call and speak to someone in the clinic department at our office:   [phone].  MIRALAX - GATORADE -- DULCOLAX TABS:   Fill the prescriptions for MIRALAX  (255 gm bottle)    In addition, purchase four (4) DULCOLAX TABLETS (no prescription required), and one 64 oz GATORADE.  (Do NOT purchase red Gatorade; any other flavor is acceptable).   INSTRUCTIONS: 1. Five days prior to your procedure do not eat nuts, popcorn, or fruit with seeds.  Stop all fiber supplements such as Metamucil, Citrucel, etc.  2. The day before your procedure: o 6:00am:  take (4) Dulcolax tablets.  You should remain on clear liquids for the entire day.   CLEAR LIQUIDS: clear bouillon, broth, jello (NOT RED), black coffee, tea, soda, etc o 10:00am:  add the bottle of MiraLax to the 64-oz bottle of Gatorade, and dissolve.  Begin drinking the Gatorade mixture until gone (8 oz every 15-30 minutes).  Continue clear liquids until midnight (or bedtime).   3. The day of your procedure:   Do not eat or drink ANYTHING after midnight before your surgery.     If you take Heart or Blood Pressure medicine, ask the pre-op nurses about these during your preop appointment.   Further pre-operative instructions will be given to you from the hospital.   Expect to be contacted 5-7 days before your surgery.    [facility], [place] [phone]  OPEN ABDOMINAL SURGERY: POST OP INSTRUCTIONS  Always review your discharge instruction sheet given to you by the facility where your surgery was performed.  IF YOU HAVE DISABILITY OR FAMILY LEAVE  FORMS, YOU MUST BRING THEM TO THE OFFICE FOR PROCESSING.  PLEASE DO NOT GIVE THEM TO YOUR DOCTOR.  1. A prescription for pain medication may be given to you upon discharge.  Take your pain medication as prescribed, if needed.  If narcotic pain medicine is not needed, then you may take acetaminophen (Tylenol) or ibuprofen (Advil) as needed. 2. Take your usually prescribed medications unless otherwise directed. 3. If you need a refill on your pain medication, please contact your pharmacy. They will contact our office to request authorization.  Prescriptions will not be filled after 5pm or on week-ends. 4. You should follow a light diet the first few days after arrival home, such as soup and crackers, pudding, etc.unless your doctor has advised otherwise. A high-fiber, low fat diet can be resumed as tolerated.   Be sure to include lots of fluids daily. 5. Most patients will experience some swelling and bruising in the area of the incision. Ice pack will help. Swelling and bruising can take several days to resolve..  6. It is common to experience some constipation if taking pain medication after surgery.  Increasing fluid intake and taking a stool softener will usually help or prevent this problem from occurring.  A mild laxative (Milk of Magnesia or Miralax) should be taken according to package directions if there are no bowel movements after 48 hours. 7.  You may have steri-strips (small skin tapes) in place directly over  the incision.  These strips should be left on the skin for 7-10 days.  If your surgeon used skin glue on the incision, you may shower in 24 hours.  The glue will flake off over the next 2-3 weeks.  Any sutures or staples will be removed at the office during your follow-up visit. You may find that a light gauze bandage over your incision may keep your staples from being rubbed or pulled. You may shower and replace the bandage daily. 8. ACTIVITIES:  You may resume regular (light) daily  activities beginning the next day-such as daily self-care, walking, climbing stairs-gradually increasing activities as tolerated.  You may have sexual intercourse when it is comfortable.  Refrain from any heavy lifting or straining for 6 weeks. a. You may drive when you no longer are taking prescription pain medication, you can comfortably wear a seatbelt, and you can safely maneuver your car and apply brakes b. Return to Work:   To be determined.___________________________________ 9. You should see your doctor in the office for a follow-up appointment approximately two weeks after your surgery.  Make sure that you call for this appointment within a day or two after you arrive home to insure a convenient appointment time. OTHER INSTRUCTIONS:  _____________________________________________________________ _____________________________________________________________  WHEN TO CALL YOUR DOCTOR: 1. Fever over 101.0 2. Inability to urinate 3. Nausea and/or vomiting 4. Extreme swelling or bruising 5. Continued bleeding from incision. 6. Increased pain, redness, or drainage from the incision.  The clinic staff is available to answer your questions during regular business hours.  Please don't hesitate to call and ask to speak to one of the nurses if you have concerns.  For further questions, please visit [email or web address]

## 2014-05-14 ENCOUNTER — Other Ambulatory Visit (INDEPENDENT_AMBULATORY_CARE_PROVIDER_SITE_OTHER): Payer: Self-pay | Admitting: General Surgery

## 2014-05-14 DIAGNOSIS — K5732 Diverticulitis of large intestine without perforation or abscess without bleeding: Secondary | ICD-10-CM

## 2014-05-17 ENCOUNTER — Telehealth (INDEPENDENT_AMBULATORY_CARE_PROVIDER_SITE_OTHER): Payer: Self-pay

## 2014-05-17 NOTE — Telephone Encounter (Signed)
Pt LM that [facility] has received our referral request, and we are waiting on an appointment for him.  Will let him know as soon as we hear from them.

## 2014-05-20 ENCOUNTER — Ambulatory Visit (INDEPENDENT_AMBULATORY_CARE_PROVIDER_SITE_OTHER): Payer: BC Managed Care – PPO | Admitting: General Surgery

## 2014-05-31 ENCOUNTER — Telehealth (INDEPENDENT_AMBULATORY_CARE_PROVIDER_SITE_OTHER): Payer: Self-pay

## 2014-05-31 NOTE — Telephone Encounter (Signed)
Patient cleared for colectomy surgery by Dr. Janice Norrie.  Orders in Epic and sent to [facility].

## 2014-06-16 ENCOUNTER — Encounter (HOSPITAL_COMMUNITY): Payer: Self-pay | Admitting: Pharmacy Technician

## 2014-06-18 ENCOUNTER — Encounter (HOSPITAL_COMMUNITY)
Admission: RE | Admit: 2014-06-18 | Discharge: 2014-06-18 | Disposition: A | Discharge status: Home / Self Care | Payer: BC Managed Care – PPO | Source: Ambulatory Visit | Attending: General Surgery | Admitting: General Surgery

## 2014-06-18 ENCOUNTER — Encounter (HOSPITAL_COMMUNITY): Payer: Self-pay

## 2014-06-18 DIAGNOSIS — K5732 Diverticulitis of large intestine without perforation or abscess without bleeding: Secondary | ICD-10-CM | POA: Diagnosis not present

## 2014-06-18 DIAGNOSIS — Z01812 Encounter for preprocedural laboratory examination: Secondary | ICD-10-CM | POA: Diagnosis present

## 2014-06-18 HISTORY — DX: Personal history of urinary calculi: Z87.442

## 2014-06-18 HISTORY — DX: Sleep apnea, unspecified: G47.30

## 2014-06-18 HISTORY — DX: Gastro-esophageal reflux disease without esophagitis: K21.9

## 2014-06-18 LAB — CBC WITH DIFFERENTIAL/PLATELET
BASOS ABS: 0 10*3/uL (ref 0.0–0.1)
Basophils Relative: 0 % (ref 0–1)
Eosinophils Absolute: 0.1 10*3/uL (ref 0.0–0.7)
Eosinophils Relative: 2 % (ref 0–5)
HEMATOCRIT: 44.9 % (ref 39.0–52.0)
HEMOGLOBIN: 15.6 g/dL (ref 13.0–17.0)
LYMPHS PCT: 24 % (ref 12–46)
Lymphs Abs: 1 10*3/uL (ref 0.7–4.0)
MCH: 31.6 pg (ref 26.0–34.0)
MCHC: 34.7 g/dL (ref 30.0–36.0)
MCV: 91.1 fL (ref 78.0–100.0)
MONO ABS: 0.3 10*3/uL (ref 0.1–1.0)
Monocytes Relative: 7 % (ref 3–12)
NEUTROS ABS: 2.8 10*3/uL (ref 1.7–7.7)
Neutrophils Relative %: 67 % (ref 43–77)
Platelets: 154 10*3/uL (ref 150–400)
RBC: 4.93 MIL/uL (ref 4.22–5.81)
RDW: 12.9 % (ref 11.5–15.5)
WBC: 4.1 10*3/uL (ref 4.0–10.5)

## 2014-06-18 LAB — COMPREHENSIVE METABOLIC PANEL
ALT: 31 U/L (ref 0–53)
AST: 23 U/L (ref 0–37)
Albumin: 4.1 g/dL (ref 3.5–5.2)
Alkaline Phosphatase: 88 U/L (ref 39–117)
Anion gap: 12 (ref 5–15)
BUN: 13 mg/dL (ref 6–23)
CO2: 25 meq/L (ref 19–32)
CREATININE: 0.97 mg/dL (ref 0.50–1.35)
Calcium: 9.4 mg/dL (ref 8.4–10.5)
Chloride: 104 mEq/L (ref 96–112)
GFR calc Af Amer: >90 mL/min (ref >90)
Glucose, Bld: 119 mg/dL — ABNORMAL HIGH (ref 70–99)
Potassium: 4.2 mEq/L (ref 3.7–5.3)
Sodium: 141 mEq/L (ref 137–147)
Total Bilirubin: 1.3 mg/dL — ABNORMAL HIGH (ref 0.3–1.2)
Total Protein: 7.2 g/dL (ref 6.0–8.3)

## 2014-06-18 LAB — PROTIME-INR
INR: 1.12 (ref 0.00–1.49)
Prothrombin Time: 14.4 seconds (ref 11.6–15.2)

## 2014-06-18 NOTE — Pre-Procedure Instructions (Signed)
EKG AND CXR NOT NEEDED PER ANESTHESIOLOGIST'S GUIDELINES. 

## 2014-06-18 NOTE — Patient Instructions (Signed)
YOUR SURGERY IS SCHEDULED AT [facility]  ON:  Thursday  9/17  REPORT TO  SHORT STAY CENTER AT:  9:00 AM   PLEASE COME IN THE [place] ENTRANCE AND FOLLOW SIGNS TO SHORT STAY CENTER.  DO NOT EAT OR DRINK ANYTHING AFTER MIDNIGHT THE NIGHT BEFORE YOUR SURGERY.  YOU MAY BRUSH YOUR TEETH, RINSE OUT YOUR MOUTH--BUT NO WATER, NO FOOD, NO CHEWING GUM, NO MINTS, NO CANDIES, NO CHEWING TOBACCO.  PLEASE TAKE THE FOLLOWING MEDICATIONS THE AM OF YOUR SURGERY WITH A FEW SIPS OF WATER:  PROPANOLOL    DO NOT BRING VALUABLES, MONEY, CREDIT CARDS.  DO NOT WEAR JEWELRY, MAKE-UP, NAIL POLISH AND NO METAL PINS OR CLIPS IN YOUR HAIR. CONTACT LENS, DENTURES / PARTIALS, GLASSES SHOULD NOT BE WORN TO SURGERY AND IN MOST CASES-HEARING AIDS WILL NEED TO BE REMOVED.  BRING YOUR GLASSES CASE, ANY EQUIPMENT NEEDED FOR YOUR CONTACT LENS. FOR PATIENTS ADMITTED TO THE HOSPITAL--CHECK OUT TIME THE DAY OF DISCHARGE IS 11:00 AM.  ALL INPATIENT ROOMS ARE PRIVATE - WITH BATHROOM, TELEPHONE, TELEVISION AND WIFI INTERNET.   WHAT IS A BLOOD TRANSFUSION? Blood Transfusion Information  A transfusion is the replacement of blood or some of its parts. Blood is made up of multiple cells which provide different functions.  Red blood cells carry oxygen and are used for blood loss replacement.  White blood cells fight against infection.  Platelets control bleeding.  Plasma helps clot blood.  Other blood products are available for specialized needs, such as hemophilia or other clotting disorders. BEFORE THE TRANSFUSION  Who gives blood for transfusions?   Healthy volunteers who are fully evaluated to make sure their blood is safe. This is blood bank blood. Transfusion therapy is the safest it has ever been in the practice of medicine. Before blood is taken from a donor, a complete history is taken to make sure that person has no history of diseases nor engages in risky social behavior (examples are  intravenous drug use or sexual activity with multiple partners). The donor's travel history is screened to minimize risk of transmitting infections, such as malaria. The donated blood is tested for signs of infectious diseases, such as HIV and hepatitis. The blood is then tested to be sure it is compatible with you in order to minimize the chance of a transfusion reaction. If you or a relative donates blood, this is often done in anticipation of surgery and is not appropriate for emergency situations. It takes many days to process the donated blood. RISKS AND COMPLICATIONS Although transfusion therapy is very safe and saves many lives, the main dangers of transfusion include:   Getting an infectious disease.  Developing a transfusion reaction. This is an allergic reaction to something in the blood you were given. Every precaution is taken to prevent this. The decision to have a blood transfusion has been considered carefully by your caregiver before blood is given. Blood is not given unless the benefits outweigh the risks. AFTER THE TRANSFUSION  Right after receiving a blood transfusion, you will usually feel much better and more energetic. This is especially true if your red blood cells have gotten low (anemic). The transfusion raises the level of the red blood cells which carry oxygen, and this usually causes an energy increase.  The nurse administering the transfusion will monitor you carefully for complications. HOME CARE INSTRUCTIONS  No special instructions are needed after a transfusion. You may find your energy is better. Speak with your  caregiver about any limitations on activity for underlying diseases you may have. SEEK MEDICAL CARE IF:   Your condition is not improving after your transfusion.  You develop redness or irritation at the intravenous (IV) site. SEEK IMMEDIATE MEDICAL CARE IF:  Any of the following symptoms occur over the next 12 hours:  Shaking chills.  You have a  temperature by mouth above 102 F (38.9 C), not controlled by medicine.  Chest, back, or muscle pain.  People around you feel you are not acting correctly or are confused.  Shortness of breath or difficulty breathing.  Dizziness and fainting.  You get a rash or develop hives.  You have a decrease in urine output.  Your urine turns a dark color or changes to pink, red, or brown. Any of the following symptoms occur over the next 10 days:  You have a temperature by mouth above 102 F (38.9 C), not controlled by medicine.  Shortness of breath.  Weakness after normal activity.  The white part of the eye turns yellow (jaundice).  You have a decrease in the amount of urine or are urinating less often.  Your urine turns a dark color or changes to pink, red, or brown. Document Released: 09/21/2000 Document Revised: 12/17/2011 Document Reviewed: 05/10/2008 ExitCare Patient Information 2014 ExitCare, [place].  _______________________________________________________________________ PLEASE BE AWARE THAT YOU MAY NEED ADDITIONAL BLOOD DRAWN DAY OF YOUR SURGERY  _______________________________________________________________________   [facility] - Preparing for Surgery Before surgery, you can play an important role.  Because skin is not sterile, your skin needs to be as free of germs as possible.  You can reduce the number of germs on your skin by washing with CHG (chlorahexidine gluconate) soap before surgery.  CHG is an antiseptic cleaner which kills germs and bonds with the skin to continue killing germs even after washing. Please DO NOT use if you have an allergy to CHG or antibacterial soaps.  If your skin becomes reddened/irritated stop using the CHG and inform your nurse when you arrive at Short Stay. Do not shave (including legs and underarms) for at least 48 hours prior to the first CHG shower.  You may shave your face/neck. Please follow these instructions carefully:  1.  Shower  with CHG Soap the night before surgery and the  morning of Surgery.  2.  If you choose to wash your hair, wash your hair first as usual with your  normal  shampoo.  3.  After you shampoo, rinse your hair and body thoroughly to remove the  shampoo.                           4.  Use CHG as you would any other liquid soap.  You can apply chg directly  to the skin and wash                       Gently with a scrungie or clean washcloth.  5.  Apply the CHG Soap to your body ONLY FROM THE NECK DOWN.   Do not use on face/ open                           Wound or open sores. Avoid contact with eyes, ears mouth and genitals (private parts).                       Wash  face,  Genitals (private parts) with your normal soap.             6.  Wash thoroughly, paying special attention to the area where your surgery  will be performed.  7.  Thoroughly rinse your body with warm water from the neck down.  8.  DO NOT shower/wash with your normal soap after using and rinsing off  the CHG Soap.                9.  Pat yourself dry with a clean towel.            10.  Wear clean pajamas.            11.  Place clean sheets on your bed the night of your first shower and do not  sleep with pets. Day of Surgery : Do not apply any lotions/deodorants the morning of surgery.  Please wear clean clothes to the hospital/surgery center.  FAILURE TO FOLLOW THESE INSTRUCTIONS MAY RESULT IN THE CANCELLATION OF YOUR SURGERY PATIENT SIGNATURE_________________________________  NURSE SIGNATURE__________________________________  ________________________________________________________________________

## 2014-06-21 ENCOUNTER — Encounter (HOSPITAL_COMMUNITY): Payer: Self-pay

## 2014-06-24 ENCOUNTER — Inpatient Hospital Stay (HOSPITAL_COMMUNITY): Payer: BC Managed Care – PPO | Admitting: Anesthesiology

## 2014-06-24 ENCOUNTER — Encounter (HOSPITAL_COMMUNITY)
Admission: RE | Disposition: A | Discharge status: Home / Self Care | Payer: Self-pay | Source: Ambulatory Visit | Attending: General Surgery

## 2014-06-24 ENCOUNTER — Encounter (HOSPITAL_COMMUNITY): Payer: Self-pay | Admitting: *Deleted

## 2014-06-24 ENCOUNTER — Encounter (HOSPITAL_COMMUNITY): Payer: BC Managed Care – PPO | Admitting: Anesthesiology

## 2014-06-24 ENCOUNTER — Inpatient Hospital Stay (HOSPITAL_COMMUNITY)
Admission: RE | Admit: 2014-06-24 | Discharge: 2014-06-28 | DRG: 331 | Disposition: A | Discharge status: Home / Self Care | Payer: BC Managed Care – PPO | Source: Ambulatory Visit | Attending: General Surgery | Admitting: General Surgery

## 2014-06-24 DIAGNOSIS — Z823 Family history of stroke: Secondary | ICD-10-CM | POA: Diagnosis not present

## 2014-06-24 DIAGNOSIS — Z8249 Family history of ischemic heart disease and other diseases of the circulatory system: Secondary | ICD-10-CM | POA: Diagnosis not present

## 2014-06-24 DIAGNOSIS — Z792 Long term (current) use of antibiotics: Secondary | ICD-10-CM | POA: Diagnosis not present

## 2014-06-24 DIAGNOSIS — G4730 Sleep apnea, unspecified: Secondary | ICD-10-CM | POA: Diagnosis present

## 2014-06-24 DIAGNOSIS — Z9089 Acquired absence of other organs: Secondary | ICD-10-CM | POA: Diagnosis not present

## 2014-06-24 DIAGNOSIS — G250 Essential tremor: Secondary | ICD-10-CM | POA: Diagnosis present

## 2014-06-24 DIAGNOSIS — E785 Hyperlipidemia, unspecified: Secondary | ICD-10-CM | POA: Diagnosis present

## 2014-06-24 DIAGNOSIS — K5732 Diverticulitis of large intestine without perforation or abscess without bleeding: Principal | ICD-10-CM | POA: Diagnosis present

## 2014-06-24 DIAGNOSIS — K219 Gastro-esophageal reflux disease without esophagitis: Secondary | ICD-10-CM | POA: Diagnosis present

## 2014-06-24 DIAGNOSIS — G252 Other specified forms of tremor: Secondary | ICD-10-CM

## 2014-06-24 HISTORY — PX: LAPAROSCOPIC PARTIAL COLECTOMY: SHX5907

## 2014-06-24 LAB — TYPE AND SCREEN
ABO/RH(D): B POS
Antibody Screen: NEGATIVE

## 2014-06-24 LAB — ABO/RH: ABO/RH(D): B POS

## 2014-06-24 SURGERY — LAPAROSCOPIC PARTIAL COLECTOMY
Anesthesia: General

## 2014-06-24 MED ORDER — PROPRANOLOL HCL 20 MG PO TABS
20.0000 mg | ORAL_TABLET | Freq: Every morning | ORAL | Status: DC
  Administered 2014-06-25: 20 mg via ORAL
  Filled 2014-06-24 (×2): qty 1

## 2014-06-24 MED ORDER — LACTATED RINGERS IV SOLN
INTRAVENOUS | Status: DC | PRN
  Administered 2014-06-24: 1000 mL via INTRAVENOUS

## 2014-06-24 MED ORDER — CEFOTETAN DISODIUM-DEXTROSE 2-2.08 GM-% IV SOLR
INTRAVENOUS | Status: AC
  Filled 2014-06-24: qty 50

## 2014-06-24 MED ORDER — HYDROMORPHONE HCL 1 MG/ML IJ SOLN
INTRAMUSCULAR | Status: AC
  Filled 2014-06-24: qty 1

## 2014-06-24 MED ORDER — ONDANSETRON HCL 4 MG/2ML IJ SOLN
INTRAMUSCULAR | Status: AC
  Filled 2014-06-24: qty 2

## 2014-06-24 MED ORDER — FENTANYL CITRATE 0.05 MG/ML IJ SOLN
INTRAMUSCULAR | Status: AC
  Filled 2014-06-24: qty 2

## 2014-06-24 MED ORDER — OXYCODONE HCL 5 MG PO TABS: 5.0000 mg | ORAL_TABLET | Freq: Once | ORAL | Status: DC | PRN

## 2014-06-24 MED ORDER — MORPHINE SULFATE (PF) 1 MG/ML IV SOLN
INTRAVENOUS | Status: AC
  Filled 2014-06-24: qty 25

## 2014-06-24 MED ORDER — FENTANYL CITRATE 0.05 MG/ML IJ SOLN
INTRAMUSCULAR | Status: AC
  Filled 2014-06-24: qty 5

## 2014-06-24 MED ORDER — CEFOTETAN DISODIUM 2 G IJ SOLR
2.0000 g | Freq: Two times a day (BID) | INTRAMUSCULAR | Status: AC
  Administered 2014-06-24: 2 g via INTRAVENOUS
  Filled 2014-06-24: qty 2

## 2014-06-24 MED ORDER — ONDANSETRON HCL 4 MG PO TABS: 4.0000 mg | ORAL_TABLET | Freq: Four times a day (QID) | ORAL | Status: DC | PRN

## 2014-06-24 MED ORDER — LACTATED RINGERS IV SOLN
INTRAVENOUS | Status: DC
  Administered 2014-06-24: 1000 mL via INTRAVENOUS
  Administered 2014-06-24 (×2): via INTRAVENOUS

## 2014-06-24 MED ORDER — PROPOFOL 10 MG/ML IV BOLUS
INTRAVENOUS | Status: AC
  Filled 2014-06-24: qty 20

## 2014-06-24 MED ORDER — MIDAZOLAM HCL 5 MG/5ML IJ SOLN
INTRAMUSCULAR | Status: DC | PRN
  Administered 2014-06-24: 2 mg via INTRAVENOUS

## 2014-06-24 MED ORDER — MEPERIDINE HCL 50 MG/ML IJ SOLN: 6.2500 mg | INTRAMUSCULAR | Status: DC | PRN

## 2014-06-24 MED ORDER — DIPHENHYDRAMINE HCL 12.5 MG/5ML PO ELIX: 12.5000 mg | ORAL_SOLUTION | Freq: Four times a day (QID) | ORAL | Status: DC | PRN

## 2014-06-24 MED ORDER — SUCCINYLCHOLINE CHLORIDE 20 MG/ML IJ SOLN
INTRAMUSCULAR | Status: DC | PRN
  Administered 2014-06-24: 100 mg via INTRAVENOUS

## 2014-06-24 MED ORDER — ONDANSETRON HCL 4 MG/2ML IJ SOLN: 4.0000 mg | INTRAMUSCULAR | Status: DC | PRN

## 2014-06-24 MED ORDER — ALVIMOPAN 12 MG PO CAPS
12.0000 mg | ORAL_CAPSULE | Freq: Two times a day (BID) | ORAL | Status: DC
  Administered 2014-06-25 (×2): 12 mg via ORAL
  Filled 2014-06-24 (×4): qty 1

## 2014-06-24 MED ORDER — PROPOFOL 10 MG/ML IV BOLUS
INTRAVENOUS | Status: DC | PRN
  Administered 2014-06-24: 200 mg via INTRAVENOUS

## 2014-06-24 MED ORDER — LIDOCAINE HCL (CARDIAC) 20 MG/ML IV SOLN
INTRAVENOUS | Status: DC | PRN
  Administered 2014-06-24: 100 mg via INTRAVENOUS

## 2014-06-24 MED ORDER — HYDROMORPHONE HCL 2 MG/ML IJ SOLN
INTRAMUSCULAR | Status: AC
  Filled 2014-06-24: qty 1

## 2014-06-24 MED ORDER — FENTANYL CITRATE 0.05 MG/ML IJ SOLN
INTRAMUSCULAR | Status: DC | PRN
  Administered 2014-06-24: 100 ug via INTRAVENOUS
  Administered 2014-06-24 (×5): 50 ug via INTRAVENOUS
  Administered 2014-06-24: 100 ug via INTRAVENOUS

## 2014-06-24 MED ORDER — DEXAMETHASONE SODIUM PHOSPHATE 10 MG/ML IJ SOLN
INTRAMUSCULAR | Status: DC | PRN
  Administered 2014-06-24: 10 mg via INTRAVENOUS

## 2014-06-24 MED ORDER — HYDROMORPHONE HCL 1 MG/ML IJ SOLN
INTRAMUSCULAR | Status: DC | PRN
  Administered 2014-06-24: 0.5 mg via INTRAVENOUS

## 2014-06-24 MED ORDER — NEOSTIGMINE METHYLSULFATE 10 MG/10ML IV SOLN
INTRAVENOUS | Status: AC
  Filled 2014-06-24: qty 1

## 2014-06-24 MED ORDER — PROPRANOLOL HCL 20 MG PO TABS
20.0000 mg | ORAL_TABLET | Freq: Once | ORAL | Status: AC
  Administered 2014-06-24: 20 mg via ORAL
  Filled 2014-06-24: qty 1

## 2014-06-24 MED ORDER — HEPARIN SODIUM (PORCINE) 5000 UNIT/ML IJ SOLN
5000.0000 [IU] | Freq: Three times a day (TID) | INTRAMUSCULAR | Status: DC
  Administered 2014-06-25 – 2014-06-28 (×9): 5000 [IU] via SUBCUTANEOUS
  Filled 2014-06-24 (×12): qty 1

## 2014-06-24 MED ORDER — ONDANSETRON HCL 4 MG/2ML IJ SOLN
INTRAMUSCULAR | Status: DC | PRN
  Administered 2014-06-24: 4 mg via INTRAVENOUS

## 2014-06-24 MED ORDER — CEFOTETAN DISODIUM-DEXTROSE 2-2.08 GM-% IV SOLR
2.0000 g | Freq: Once | INTRAVENOUS | Status: AC
  Administered 2014-06-24: 2 g via INTRAVENOUS

## 2014-06-24 MED ORDER — ONDANSETRON HCL 4 MG/2ML IJ SOLN: 4.0000 mg | Freq: Four times a day (QID) | INTRAMUSCULAR | Status: DC | PRN

## 2014-06-24 MED ORDER — ROCURONIUM BROMIDE 100 MG/10ML IV SOLN
INTRAVENOUS | Status: AC
  Filled 2014-06-24: qty 1

## 2014-06-24 MED ORDER — BUPIVACAINE HCL (PF) 0.5 % IJ SOLN
INTRAMUSCULAR | Status: AC
  Filled 2014-06-24: qty 30

## 2014-06-24 MED ORDER — ALVIMOPAN 12 MG PO CAPS
12.0000 mg | ORAL_CAPSULE | Freq: Once | ORAL | Status: AC
  Administered 2014-06-24: 12 mg via ORAL
  Filled 2014-06-24: qty 1

## 2014-06-24 MED ORDER — GLYCOPYRROLATE 0.2 MG/ML IJ SOLN
INTRAMUSCULAR | Status: AC
  Filled 2014-06-24: qty 3

## 2014-06-24 MED ORDER — MIDAZOLAM HCL 2 MG/2ML IJ SOLN
INTRAMUSCULAR | Status: AC
  Filled 2014-06-24: qty 2

## 2014-06-24 MED ORDER — MORPHINE SULFATE (PF) 1 MG/ML IV SOLN
INTRAVENOUS | Status: DC
  Administered 2014-06-24: 15 mg via INTRAVENOUS
  Administered 2014-06-24: 4.5 mg via INTRAVENOUS
  Administered 2014-06-24: 16:00:00 via INTRAVENOUS
  Administered 2014-06-25: 5.63 mg via INTRAVENOUS
  Administered 2014-06-25: 10.41 mg via INTRAVENOUS
  Administered 2014-06-25: 15 mg via INTRAVENOUS
  Administered 2014-06-25: 13.5 mg via INTRAVENOUS
  Administered 2014-06-25: 02:00:00 via INTRAVENOUS
  Administered 2014-06-25: 6 mg via INTRAVENOUS
  Administered 2014-06-26: 4.5 mg via INTRAVENOUS
  Administered 2014-06-26: 13.36 mg via INTRAVENOUS
  Administered 2014-06-26: 1.5 mg via INTRAVENOUS
  Administered 2014-06-26: 9 mg via INTRAVENOUS
  Administered 2014-06-26: 01:00:00 via INTRAVENOUS
  Filled 2014-06-24 (×3): qty 25

## 2014-06-24 MED ORDER — NEOSTIGMINE METHYLSULFATE 10 MG/10ML IV SOLN
INTRAVENOUS | Status: DC | PRN
  Administered 2014-06-24: 4 mg via INTRAVENOUS

## 2014-06-24 MED ORDER — SODIUM CHLORIDE 0.9 % IJ SOLN: 9.0000 mL | INTRAMUSCULAR | Status: DC | PRN

## 2014-06-24 MED ORDER — HYDROMORPHONE HCL 1 MG/ML IJ SOLN
0.2500 mg | INTRAMUSCULAR | Status: DC | PRN
  Administered 2014-06-24 (×2): 0.5 mg via INTRAVENOUS

## 2014-06-24 MED ORDER — NALOXONE HCL 0.4 MG/ML IJ SOLN: 0.4000 mg | INTRAMUSCULAR | Status: DC | PRN

## 2014-06-24 MED ORDER — ROCURONIUM BROMIDE 100 MG/10ML IV SOLN
INTRAVENOUS | Status: DC | PRN
  Administered 2014-06-24 (×7): 10 mg via INTRAVENOUS
  Administered 2014-06-24: 40 mg via INTRAVENOUS
  Administered 2014-06-24: 10 mg via INTRAVENOUS

## 2014-06-24 MED ORDER — PANTOPRAZOLE SODIUM 40 MG IV SOLR
40.0000 mg | INTRAVENOUS | Status: DC
  Administered 2014-06-24 – 2014-06-26 (×3): 40 mg via INTRAVENOUS
  Filled 2014-06-24 (×4): qty 40

## 2014-06-24 MED ORDER — KCL-LACTATED RINGERS-D5W 20 MEQ/L IV SOLN
INTRAVENOUS | Status: DC
  Administered 2014-06-24: 16:00:00 via INTRAVENOUS
  Administered 2014-06-25: 125 mL via INTRAVENOUS
  Filled 2014-06-24 (×7): qty 1000

## 2014-06-24 MED ORDER — GLYCOPYRROLATE 0.2 MG/ML IJ SOLN
INTRAMUSCULAR | Status: DC | PRN
  Administered 2014-06-24: .6 mg via INTRAVENOUS

## 2014-06-24 MED ORDER — 0.9 % SODIUM CHLORIDE (POUR BTL) OPTIME
TOPICAL | Status: DC | PRN
  Administered 2014-06-24: 4000 mL

## 2014-06-24 MED ORDER — LIDOCAINE HCL (CARDIAC) 20 MG/ML IV SOLN
INTRAVENOUS | Status: AC
  Filled 2014-06-24: qty 5

## 2014-06-24 MED ORDER — DIPHENHYDRAMINE HCL 50 MG/ML IJ SOLN
12.5000 mg | Freq: Four times a day (QID) | INTRAMUSCULAR | Status: DC | PRN
  Administered 2014-06-25 – 2014-06-26 (×2): 12.5 mg via INTRAVENOUS
  Filled 2014-06-24 (×2): qty 1

## 2014-06-24 MED ORDER — OXYCODONE HCL 5 MG/5ML PO SOLN
5.0000 mg | Freq: Once | ORAL | Status: DC | PRN
  Filled 2014-06-24: qty 5

## 2014-06-24 MED ORDER — BUPIVACAINE HCL (PF) 0.5 % IJ SOLN
INTRAMUSCULAR | Status: DC | PRN
  Administered 2014-06-24: 10 mL

## 2014-06-24 MED ORDER — PROMETHAZINE HCL 25 MG/ML IJ SOLN: 6.2500 mg | INTRAMUSCULAR | Status: DC | PRN

## 2014-06-24 MED ORDER — DEXAMETHASONE SODIUM PHOSPHATE 10 MG/ML IJ SOLN
INTRAMUSCULAR | Status: AC
  Filled 2014-06-24: qty 1

## 2014-06-24 SURGICAL SUPPLY — 80 items
APPLIER CLIP 5 13 M/L LIGAMAX5 (MISCELLANEOUS)
APPLIER CLIP ROT 10 11.4 M/L (STAPLE)
BLADE EXTENDED COATED 6.5IN (ELECTRODE) IMPLANT
BLADE HEX COATED 2.75 (ELECTRODE) ×2 IMPLANT
BLADE SURG SZ10 CARB STEEL (BLADE) ×2 IMPLANT
CABLE HIGH FREQUENCY MONO STRZ (ELECTRODE) ×2 IMPLANT
CANISTER SUCTION 2500CC (MISCELLANEOUS) IMPLANT
CELLS DAT CNTRL 66122 CELL SVR (MISCELLANEOUS) ×1 IMPLANT
CLIP APPLIE 5 13 M/L LIGAMAX5 (MISCELLANEOUS) IMPLANT
CLIP APPLIE ROT 10 11.4 M/L (STAPLE) IMPLANT
COUNTER NEEDLE 20 DBL MAG RED (NEEDLE) ×2 IMPLANT
COVER MAYO STAND STRL (DRAPES) ×2 IMPLANT
DECANTER SPIKE VIAL GLASS SM (MISCELLANEOUS) ×2 IMPLANT
DISSECTOR BLUNT TIP ENDO 5MM (MISCELLANEOUS) IMPLANT
DRAIN CHANNEL 19F RND (DRAIN) ×2 IMPLANT
DRAPE LAPAROSCOPIC ABDOMINAL (DRAPES) ×2 IMPLANT
DRAPE LG THREE QUARTER DISP (DRAPES) ×4 IMPLANT
DRAPE UTILITY XL STRL (DRAPES) ×4 IMPLANT
DRAPE WARM FLUID 44X44 (DRAPE) ×2 IMPLANT
DRSG OPSITE POSTOP 4X10 (GAUZE/BANDAGES/DRESSINGS) IMPLANT
DRSG OPSITE POSTOP 4X6 (GAUZE/BANDAGES/DRESSINGS) IMPLANT
DRSG OPSITE POSTOP 4X8 (GAUZE/BANDAGES/DRESSINGS) IMPLANT
ELECT PENCIL ROCKER SW 15FT (MISCELLANEOUS) ×4 IMPLANT
ELECT REM PT RETURN 9FT ADLT (ELECTROSURGICAL) ×2
ELECTRODE REM PT RTRN 9FT ADLT (ELECTROSURGICAL) ×1 IMPLANT
EVACUATOR SILICONE 100CC (DRAIN) IMPLANT
FILTER SMOKE EVAC LAPAROSHD (FILTER) IMPLANT
GAUZE SPONGE 4X4 12PLY STRL (GAUZE/BANDAGES/DRESSINGS) ×2 IMPLANT
GLOVE ECLIPSE 8.0 STRL XLNG CF (GLOVE) ×6 IMPLANT
GLOVE INDICATOR 8.0 STRL GRN (GLOVE) ×6 IMPLANT
GOWN STRL REUS W/TWL XL LVL3 (GOWN DISPOSABLE) ×8 IMPLANT
HOLDER FOLEY CATH W/STRAP (MISCELLANEOUS) ×2 IMPLANT
KIT BASIN OR (CUSTOM PROCEDURE TRAY) ×4 IMPLANT
LEGGING LITHOTOMY PAIR STRL (DRAPES) ×4 IMPLANT
LIGASURE IMPACT 36 18CM CVD LR (INSTRUMENTS) ×2 IMPLANT
MANIFOLD NEPTUNE II (INSTRUMENTS) ×4 IMPLANT
PENCIL BUTTON HOLSTER BLD 10FT (ELECTRODE) ×2 IMPLANT
RELOAD PROXIMATE 75MM BLUE (ENDOMECHANICALS) ×2 IMPLANT
RTRCTR WOUND ALEXIS 18CM MED (MISCELLANEOUS) ×2
SCISSORS LAP 5X35 DISP (ENDOMECHANICALS) ×2 IMPLANT
SCISSORS LAP 5X45 EPIX DISP (ENDOMECHANICALS) ×2 IMPLANT
SET IRRIG TUBING LAPAROSCOPIC (IRRIGATION / IRRIGATOR) IMPLANT
SHEARS HARMONIC ACE PLUS 36CM (ENDOMECHANICALS) IMPLANT
SLEEVE XCEL OPT CAN 5 100 (ENDOMECHANICALS) ×6 IMPLANT
SOLUTION ANTI FOG 6CC (MISCELLANEOUS) ×2 IMPLANT
SPONGE LAP 18X18 X RAY DECT (DISPOSABLE) ×2 IMPLANT
STAPLER CIRC CVD 29MM 37CM (STAPLE) ×2 IMPLANT
STAPLER CUT CVD 40MM BLUE (STAPLE) ×2 IMPLANT
STAPLER PROXIMATE 75MM BLUE (STAPLE) ×2 IMPLANT
STAPLER VISISTAT 35W (STAPLE) IMPLANT
SUCTION POOLE TIP (SUCTIONS) ×4 IMPLANT
SUT ETHILON 2 0 PS N (SUTURE) IMPLANT
SUT ETHILON 3 0 PS 1 (SUTURE) ×2 IMPLANT
SUT PDS AB 1 CTX 36 (SUTURE) IMPLANT
SUT PDS AB 1 TP1 96 (SUTURE) ×2 IMPLANT
SUT PROLENE 2 0 KS (SUTURE) IMPLANT
SUT PROLENE 2 0 SH DA (SUTURE) ×2 IMPLANT
SUT SILK 2 0 (SUTURE) ×1
SUT SILK 2 0 SH CR/8 (SUTURE) ×2 IMPLANT
SUT SILK 2-0 18XBRD TIE 12 (SUTURE) ×1 IMPLANT
SUT SILK 3 0 (SUTURE) ×1
SUT SILK 3 0 SH CR/8 (SUTURE) ×4 IMPLANT
SUT SILK 3-0 18XBRD TIE 12 (SUTURE) ×1 IMPLANT
SUT VIC AB 2-0 SH 18 (SUTURE) ×2 IMPLANT
SUT VICRYL 2 0 18  UND BR (SUTURE)
SUT VICRYL 2 0 18 UND BR (SUTURE) IMPLANT
SYR BULB IRRIGATION 50ML (SYRINGE) ×2 IMPLANT
SYS LAPSCP GELPORT 120MM (MISCELLANEOUS)
SYSTEM LAPSCP GELPORT 120MM (MISCELLANEOUS) IMPLANT
TAPE CLOTH SURG 6X10 WHT LF (GAUZE/BANDAGES/DRESSINGS) ×2 IMPLANT
TOWEL OR 17X26 10 PK STRL BLUE (TOWEL DISPOSABLE) ×4 IMPLANT
TOWEL OR NON WOVEN STRL DISP B (DISPOSABLE) ×4 IMPLANT
TRAY FOLEY CATH 14FRSI W/METER (CATHETERS) IMPLANT
TRAY FOLEY CATH 16FRSI W/METER (SET/KITS/TRAYS/PACK) ×2 IMPLANT
TRAY LAP CHOLE (CUSTOM PROCEDURE TRAY) ×2 IMPLANT
TROCAR BLADELESS OPT 5 100 (ENDOMECHANICALS) ×2 IMPLANT
TROCAR XCEL BLUNT TIP 100MML (ENDOMECHANICALS) IMPLANT
TROCAR XCEL NON-BLD 11X100MML (ENDOMECHANICALS) IMPLANT
TUBING INSUFFLATION 10FT LAP (TUBING) ×2 IMPLANT
YANKAUER SUCT BULB TIP 10FT TU (MISCELLANEOUS) ×4 IMPLANT

## 2014-06-24 NOTE — Op Note (Signed)
Operative Note  Tim Gordon male 53 y.o. 06/24/2014  PREOPERATIVE DX:  Recurrent sigmoid diverticulitis  POSTOPERATIVE DX:  Same  PROCEDURE:  Laparoscopic-assisted sigmoid colectomy with mobilization of splenic flexure         Surgeon: Odis Hollingshead   Assistants: Armandina Gemma, M.D.  Anesthesia: General endotracheal anesthesia  Indications: this is a 53 year old male with recurrent sigmoid diverticulitis. He has had multiple episodes. Colonoscopy demonstrates diverticular disease. He now presents for elective partial colectomy.    Procedure Detail:  He was brought to the operating room placed supine on the operating table and a general anesthetic was given. He was placed in the lithotomy position. A Foley catheter was inserted. An oral gastric tube was inserted. The hair in the abdominal wall was clipped. The abdominal wall pelvic areas and perianal areas were sterilely prepped and draped.  He was placed in slight reverse Trendelenburg position. A 5 mm incision was made in the left upper quadrant subcostal area. Using a 5 mm laparoscope an Optiview trocar access was gained into the peritoneal cavity and a pneumoperitoneum was created. Inspection of the area under the trocar demonstrated no evidence of bleeding or organ injury.  A 5 mm trocar was placed in the supraumbilical position. A 5 mm trocar was placed in the right lower quadrant. A 5 mm trocar is placed in the suprapubic position. A 5 mm trocar was placed in the left lower quadrant.  The left lower quadrant and pelvic areas were examined. I could see the area of chronically inflamed sigmoid colon which appeared to be mid to distal. The left colon was then mobilized by dividing its lateral attachments sharply. Using sharp dissection and the harmonic scalpel I then mobilized the splenic flexure. The distal sigmoid colon and proximal rectum were then mobilized sharply. There were some inflammatory adhesions between the distal  sigmoid colon and the abdominal wall which were able to be mobilized bluntly.   The suprapubic 5 mm trocar was removed. A limited lower midline incision was made all layers. A wound protection device was placed. The sigmoid colon was exteriorized. The descending sigmoid colon junction demonstrated normal large intestine. Using the GIA stapler the colon was divided this point. I subsequently divided mesial colon close to the colon with the LigaSure device. The left ureter was identified and the plane of dissection kept anterior  to it.  I got beyond the inflamed segment of colon and went down to the proximal rectum distal to the rectosigmoid junction. The perirectal fat was dissected free. The rectum was soft and normal appearing at this point. It was then divided with linear cutting stapler.  The staple line on the descending colon was removed. A size 29 EEA stapler anvil was introduced into the colon lumen. I then closed the colon defect with the GIA stapler. I brought the anvil out the side of the colon and secured it with a 2-0 Prolene suture. Next, the handle was introduced through the rectum. I then performed an end rectum to side descending colon anastomosis using the EEA stapler. Once the stapler was removed, 2 complete doughnuts were noted. There is also a thin layer of mucosa noted.  Rigid proctosigmoidoscopy was performed. The anastomosis was widely patent. The anastomosis was placed under water and the descending colon was occluded proximally. Air was insufflated and there was no evidence of leak. The air was evacuated through the rigid proctosigmoidoscope.  The abdominal cavity was then copiously irrigated with saline solution. There was no evidence  of bleeding or organ injury. I placed a size 19 Blake drain into the left lower quadrant trocar incision. It was placed into the pelvis. It was anchored to the skin with a 3-0 nylon suture.  Following this, the peritoneum a limited lower midline  incision was closed with a running 2-0 Vicryl suture. The vas was closed with a running #1 double looped PDS suture. Repeat laparoscopy was performed and some residual irrigation fluid was evacuated. The fascial closure was solid. Four-quadrant and central inspection were performed. There was no evidence of bleeding or organ injury.  The pneumoperitoneum was then released and trocars removed. The drain was hooked up to bulb suction.  All skin incisions were closed with staples. Sterile dressings were applied.  He tolerated the procedure well without apparent complications and was taken to the recovery room in satisfactory condition.   Estimated Blood Loss:  300 mL         Drains: #19 Blake drain  Blood Given: none          Specimens: sigmoid colon        Complications:  * No complications entered in OR log *         Disposition: PACU - hemodynamically stable.         Condition: stable

## 2014-06-24 NOTE — Anesthesia Preprocedure Evaluation (Signed)
Anesthesia Evaluation  Patient identified by MRN, date of birth, ID band Patient awake    Reviewed: Allergy & Precautions, H&P , NPO status , Patient's Chart, lab work & pertinent test results, reviewed documented beta blocker date and time   Airway Mallampati: II TM Distance: >3 FB Neck ROM: Full    Dental no notable dental hx.    Pulmonary sleep apnea ,  breath sounds clear to auscultation  Pulmonary exam normal       Cardiovascular Pt. on home beta blockers negative cardio ROS  + Valvular Problems/Murmurs Rhythm:Regular Rate:Normal     Neuro/Psych  Headaches, negative psych ROS   GI/Hepatic Neg liver ROS, GERD-  Medicated,  Endo/Other  negative endocrine ROS  Renal/GU negative Renal ROS     Musculoskeletal negative musculoskeletal ROS (+)   Abdominal   Peds  Hematology negative hematology ROS (+)   Anesthesia Other Findings   Reproductive/Obstetrics negative OB ROS                           Anesthesia Physical Anesthesia Plan  ASA: II  Anesthesia Plan: General   Post-op Pain Management:    Induction: Intravenous  Airway Management Planned: Oral ETT  Additional Equipment:   Intra-op Plan:   Post-operative Plan: Extubation in OR  Informed Consent: I have reviewed the patients History and Physical, chart, labs and discussed the procedure including the risks, benefits and alternatives for the proposed anesthesia with the patient or authorized representative who has indicated his/her understanding and acceptance.   Dental advisory given  Plan Discussed with: CRNA  Anesthesia Plan Comments:         Anesthesia Quick Evaluation

## 2014-06-24 NOTE — Anesthesia Postprocedure Evaluation (Signed)
Anesthesia Post Note  Patient: Tim Gordon  Procedure(s) Performed: Procedure(s) (LRB): LAPAROSCOPIC ASSISTED  PARTIAL COLECTOMY (N/A)  Anesthesia type: General  Patient location: PACU  Post pain: Pain level controlled  Post assessment: Post-op Vital signs reviewed  Last Vitals: BP 135/84  Pulse 63  Temp(Src) 36.8 C (Oral)  Resp 16  Ht 5\' 9"  (1.753 m)  Wt 187 lb (84.823 kg)  BMI 27.60 kg/m2  SpO2 98%  Post vital signs: Reviewed  Level of consciousness: sedated  Complications: No apparent anesthesia complications

## 2014-06-24 NOTE — Interval H&P Note (Signed)
History and Physical Interval Note:  06/24/2014 11:09 AM  Tim Gordon  has presented today for surgery, with the diagnosis of recurrent diverticulitis  The various methods of treatment have been discussed with the patient and family. After consideration of risks, benefits and other options for treatment, the patient has consented to  Procedure(s): LAPAROSCOPIC ASSISTED  PARTIAL COLECTOMY (N/A) as a surgical intervention .  The patient's history has been reviewed, patient examined, no change in status, stable for surgery.  I have reviewed the patient's chart and labs.  Questions were answered to the patient's satisfaction.     Roczen Waymire Lenna Sciara

## 2014-06-24 NOTE — H&P (Signed)
Tim Gordon is an 53 y.o. male.   Chief Complaint:   He presents for elective partial colectomy HPI: Tim Gordon has recurrent episodes of sigmoid diverticulitis. Unless he is on suppressive antibiotics, he continues to have these. He now presents for elective laparoscopic-assisted partial colectomy.  Past Medical History  Diagnosis Date  . Blood in stool   . Allergy   . Hyperlipidemia   . Diverticulitis   . Heart murmur     MVP - NEVER CAUSES ANY PROBLEMS; PT VERY ACTIVE - CAN PLAY RACKETBALL FOR COUPLE OF HOURS AND NEVER ANY CHEST C/O  . GERD (gastroesophageal reflux disease)     CONTROLLED WITH DIET - NO MEDS  . Migraines     OCULAR MIGRAINES  . History of kidney stones   . Sleep apnea     STATES SLEEP STUDY 4 OR 5 YEARS AGO - TOLD BORDERLINE - TRIED CPAP FOR 30 DAYS - STATES IT DID NOT HELP - NO LONGER USES.    Past Surgical History  Procedure Laterality Date  . Appendectomy    . Vasectomy    . Spine surgery  2001    rupture L4-L5  . Nasal reconstruction with septal repair      Family History  Problem Relation Age of Onset  . Arthritis Mother   . Hyperlipidemia Mother   . Stroke Mother   . Hypertension Mother   . Heart disease Mother     valvular heart disease ? which valve.  . Cancer Father 62    prostate  . Hypertension Father   . Cancer Maternal Grandmother     ovarian  . Stroke Paternal Grandfather    Social History:  reports that he has never smoked. He has never used smokeless tobacco. He reports that he drinks alcohol. He reports that he does not use illicit drugs.  Allergies:  Allergies  Allergen Reactions  . Biaxin [Clarithromycin] Other (See Comments)    "went right through me"    Medications Prior to Admission  Medication Sig Dispense Refill  . clonazePAM (KLONOPIN) 0.5 MG tablet Take 1 tablet (0.5 mg total) by mouth at bedtime.  90 tablet  1  . Fiber POWD Take 1 scoop by mouth every morning.      . naproxen sodium (ANAPROX) 220 MG tablet Take  440 mg by mouth once as needed (muscle pain.).      Marland Kitchen omega-3 acid ethyl esters (LOVAZA) 1 G capsule Take 2 g by mouth 2 (two) times daily.      . Pantothenic Acid 250 MG CAPS Take 250 mg by mouth 2 (two) times daily.      . propranolol (INDERAL) 20 MG tablet Take 20 mg by mouth every morning.       . Amoxicillin-Pot Clavulanate (AUGMENTIN PO) Take by mouth. TWICE A DAY      . cetirizine (KLS ALLER-TEC) 10 MG tablet Take 10 mg by mouth at bedtime.       . MetroNIDAZOLE (FLAGYL PO) Take by mouth. TAKING TWICE A DAY      . Probiotic Product (PROBIOTIC DAILY PO) Take 1 capsule by mouth every morning. Ultra Flora IB      . vitamin B-12 (CYANOCOBALAMIN) 1000 MCG tablet Take 1,000 mcg by mouth every morning.         Results for orders placed during the hospital encounter of 06/24/14 (from the past 48 hour(s))  TYPE AND SCREEN     Status: None   Collection Time  06/24/14 10:00 AM      Result Value Ref Range   ABO/RH(D) B POS     Antibody Screen NEG     Sample Expiration 06/27/2014    ABO/RH     Status: None   Collection Time    06/24/14 10:00 AM      Result Value Ref Range   ABO/RH(D) B POS     No results found.  Review of Systems  Constitutional: Negative for fever and chills.  Gastrointestinal: Positive for nausea. Negative for abdominal pain.    Blood pressure 140/89, pulse 85, temperature 98.1 F (36.7 C), temperature source Oral, resp. rate 18, height 5\' 9"  (1.753 m), weight 187 lb (84.823 kg), SpO2 100.00%. Physical Exam  Constitutional: He appears well-developed and well-nourished. No distress.  HENT:  Head: Normocephalic and atraumatic.  Eyes: No scleral icterus.  Cardiovascular: Normal rate and regular rhythm.   Respiratory: Effort normal and breath sounds normal.  GI: Soft. He exhibits no distension and no mass. There is no tenderness.  Right lower quadrant scar  Musculoskeletal: He exhibits no edema.  Neurological: He is alert.  Skin: Skin is warm and dry.   Psychiatric: He has a normal mood and affect. His behavior is normal.     Assessment/Plan Recurrent sigmoid diverticulitis.  Plan: Laparoscopic assisted partial colectomy.  Racquel Arkin J 06/24/2014, 11:07 AM

## 2014-06-24 NOTE — Transfer of Care (Signed)
Immediate Anesthesia Transfer of Care Note  Patient: Tim Gordon  Procedure(s) Performed: Procedure(s) (LRB): LAPAROSCOPIC ASSISTED  PARTIAL COLECTOMY (N/A)  Patient Location: PACU  Anesthesia Type: General  Level of Consciousness: sedated, patient cooperative and responds to stimulation  Airway & Oxygen Therapy: Patient Spontanous Breathing and Patient connected to face mask oxgen  Post-op Assessment: Report given to PACU RN and Post -op Vital signs reviewed and stable  Post vital signs: Reviewed and stable  Complications: No apparent anesthesia complications

## 2014-06-25 ENCOUNTER — Encounter (HOSPITAL_COMMUNITY): Payer: Self-pay | Admitting: General Surgery

## 2014-06-25 LAB — BASIC METABOLIC PANEL
Anion gap: 11 (ref 5–15)
BUN: 15 mg/dL (ref 6–23)
CO2: 27 mEq/L (ref 19–32)
Calcium: 8.5 mg/dL (ref 8.4–10.5)
Chloride: 103 mEq/L (ref 96–112)
Creatinine, Ser: 1.2 mg/dL (ref 0.50–1.35)
GFR, EST AFRICAN AMERICAN: 79 mL/min — AB (ref >90)
GFR, EST NON AFRICAN AMERICAN: 68 mL/min — AB (ref >90)
GLUCOSE: 157 mg/dL — AB (ref 70–99)
Potassium: 5.2 mEq/L (ref 3.7–5.3)
Sodium: 141 mEq/L (ref 137–147)

## 2014-06-25 LAB — CBC
HCT: 41.8 % (ref 39.0–52.0)
Hemoglobin: 14 g/dL (ref 13.0–17.0)
MCH: 30.8 pg (ref 26.0–34.0)
MCHC: 33.5 g/dL (ref 30.0–36.0)
MCV: 91.9 fL (ref 78.0–100.0)
PLATELETS: 156 10*3/uL (ref 150–400)
RBC: 4.55 MIL/uL (ref 4.22–5.81)
RDW: 13 % (ref 11.5–15.5)
WBC: 10.2 10*3/uL (ref 4.0–10.5)

## 2014-06-25 MED ORDER — DEXTROSE-NACL 5-0.45 % IV SOLN
INTRAVENOUS | Status: DC
Start: 2014-06-25 — End: 2014-06-28
  Administered 2014-06-25: 11:00:00 via INTRAVENOUS
  Administered 2014-06-25 – 2014-06-26 (×2): 100 mL via INTRAVENOUS
  Administered 2014-06-26 – 2014-06-28 (×3): via INTRAVENOUS

## 2014-06-25 NOTE — Progress Notes (Signed)
1 Day Post-Op  Subjective: Having a small amount of discomfort.  No nausea.  Walked once early this AM.  Objective: Vital signs in last 24 hours: Temp:  [97.7 F (36.5 C)-99.5 F (37.5 C)] 98 F (36.7 C) (09/18 0600) Pulse Rate:  [60-85] 62 (09/18 0600) Resp:  [13-19] 18 (09/18 0600) BP: (95-145)/(50-94) 115/59 mmHg (09/18 0600) SpO2:  [94 %-100 %] 94 % (09/18 0600) Weight:  [187 lb (84.823 kg)] 187 lb (84.823 kg) (09/17 0952) Last BM Date: 06/24/14  Intake/Output from previous day: 09/17 0701 - 09/18 0700 In: 5000 [I.V.:5000] Out: 1940 [Urine:1600; Drains:340] Intake/Output this shift:    PE: General- In NAD Abdomen-soft, dressings dry, few bowel sounds, thin serosanguinous drain output  Lab Results:   Recent Labs  06/25/14 0459  WBC 10.2  HGB 14.0  HCT 41.8  PLT 156   BMET  Recent Labs  06/25/14 0459  NA 141  K 5.2  CL 103  CO2 27  GLUCOSE 157*  BUN 15  CREATININE 1.20  CALCIUM 8.5   PT/INR No results found for this basename: LABPROT, INR,  in the last 72 hours Comprehensive Metabolic Panel:    Component Value Date/Time   NA 141 06/25/2014 0459   NA 141 06/18/2014 1400   K 5.2 06/25/2014 0459   K 4.2 06/18/2014 1400   CL 103 06/25/2014 0459   CL 104 06/18/2014 1400   CO2 27 06/25/2014 0459   CO2 25 06/18/2014 1400   BUN 15 06/25/2014 0459   BUN 13 06/18/2014 1400   CREATININE 1.20 06/25/2014 0459   CREATININE 0.97 06/18/2014 1400   GLUCOSE 157* 06/25/2014 0459   GLUCOSE 119* 06/18/2014 1400   CALCIUM 8.5 06/25/2014 0459   CALCIUM 9.4 06/18/2014 1400   AST 23 06/18/2014 1400   AST 20 02/26/2014 0858   ALT 31 06/18/2014 1400   ALT 28 02/26/2014 0858   ALKPHOS 88 06/18/2014 1400   ALKPHOS 55 02/26/2014 0858   BILITOT 1.3* 06/18/2014 1400   BILITOT 1.5* 02/26/2014 0858   PROT 7.2 06/18/2014 1400   PROT 7.5 02/26/2014 0858   ALBUMIN 4.1 06/18/2014 1400   ALBUMIN 4.4 02/26/2014 0858     Studies/Results: No results found.  Anti-infectives: Anti-infectives   Start     Dose/Rate Route Frequency Ordered Stop   06/24/14 2200  cefoTEtan (CEFOTAN) 2 g in dextrose 5 % 50 mL IVPB     2 g 100 mL/hr over 30 Minutes Intravenous Every 12 hours 06/24/14 1639 06/24/14 2344   06/24/14 0915  cefoTEtan in Dextrose 5% (CEFOTAN) IVPB 2 g     2 g Intravenous  Once 06/24/14 0912 06/24/14 1200      Assessment Principal Problem:   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 06/25/14-stable overnight. Active Problems:   Essential tremor-on chronic Propranolol    LOS: 1 day   Plan: Start clear liquids. IS. Decrease IVF.   Jarryn Altland J 06/25/2014

## 2014-06-25 NOTE — Care Management Note (Signed)
    Page 1 of 1   06/25/2014     10:42:05 AM CARE MANAGEMENT NOTE 06/25/2014  Patient:  Tim Gordon, Tim Gordon   Account Number:  [id number]  Date Initiated:  06/25/2014  Documentation initiated by:  Sunday Spillers  Subjective/Objective Assessment:   53 yo male admitted s/p colectomy. PTA lived at home with spouse.     Action/Plan:   Home when stable   Anticipated DC Date:  06/28/2014   Anticipated DC Plan:  [place]  CM consult      Choice offered to / List presented to:             Status of service:  Completed, signed off Medicare Important Message given?   (If response is "NO", the following Medicare IM given date fields will be blank) Date Medicare IM given:   Medicare IM given by:   Date Additional Medicare IM given:   Additional Medicare IM given by:    Discharge Disposition:  HOME/SELF CARE  Per UR Regulation:  Reviewed for med. necessity/level of care/duration of stay  If discussed at [place] of Stay Meetings, dates discussed:    Comments:

## 2014-06-26 MED ORDER — OXYCODONE-ACETAMINOPHEN 5-325 MG PO TABS
1.0000 | ORAL_TABLET | ORAL | Status: DC | PRN
  Administered 2014-06-26 – 2014-06-28 (×11): 2 via ORAL
  Filled 2014-06-26 (×11): qty 2

## 2014-06-26 MED ORDER — MORPHINE SULFATE 2 MG/ML IJ SOLN: 2.0000 mg | INTRAMUSCULAR | Status: DC | PRN

## 2014-06-26 MED ORDER — PROPRANOLOL HCL 20 MG PO TABS
20.0000 mg | ORAL_TABLET | Freq: Every morning | ORAL | Status: DC
  Administered 2014-06-26 – 2014-06-28 (×3): 20 mg via ORAL
  Filled 2014-06-26 (×3): qty 1

## 2014-06-26 NOTE — Progress Notes (Signed)
Patient ID: Tim Gordon, male   DOB: [birth year], 53 y.o.   MRN: [id number] 2 Days Post-Op  Subjective: No complaints this morning. Mild pain well controlled. No nausea. Tolerating clear liquids. Has had flatus, no bowel movements. Foley just came out  Objective: Vital signs in last 24 hours: Temp:  [97.5 F (36.4 C)-98.2 F (36.8 C)] 97.5 F (36.4 C) (09/19 0600) Pulse Rate:  [52-65] 52 (09/19 0600) Resp:  [12-20] 17 (09/19 0811) BP: (108-117)/(62-91) 117/78 mmHg (09/19 0600) SpO2:  [96 %-100 %] 99 % (09/19 0811) Last BM Date: 06/24/14  Intake/Output from previous day: 09/18 0701 - 09/19 0700 In: 2876.3 [P.O.:360; I.V.:2516.3] Out: 2391 [Urine:2350; Drains:41] Intake/Output this shift: Total I/O In: 480 [P.O.:480] Out: -   General appearance: alert, cooperative and no distress GI: normal findings: soft, non-tender Incision/Wound: clean and dry, JP drainage serosanguineous  Lab Results:   Recent Labs  06/25/14 0459  WBC 10.2  HGB 14.0  HCT 41.8  PLT 156   BMET  Recent Labs  06/25/14 0459  NA 141  K 5.2  CL 103  CO2 27  GLUCOSE 157*  BUN 15  CREATININE 1.20  CALCIUM 8.5     Studies/Results: No results found.  Anti-infectives: Anti-infectives   Start     Dose/Rate Route Frequency Ordered Stop   06/24/14 2200  cefoTEtan (CEFOTAN) 2 g in dextrose 5 % 50 mL IVPB     2 g 100 mL/hr over 30 Minutes Intravenous Every 12 hours 06/24/14 1639 06/24/14 2344   06/24/14 0915  cefoTEtan in Dextrose 5% (CEFOTAN) IVPB 2 g     2 g Intravenous  Once 06/24/14 0912 06/24/14 1200      Assessment/Plan: s/p Procedure(s): LAPAROSCOPIC ASSISTED  PARTIAL COLECTOMY Doing well. Advanced to full liquid diet. Discontinue PCA. Increase activity.    LOS: 2 days    Tim Gordon T 06/26/2014

## 2014-06-27 MED ORDER — PANTOPRAZOLE SODIUM 40 MG PO TBEC
40.0000 mg | DELAYED_RELEASE_TABLET | Freq: Every day | ORAL | Status: DC
  Administered 2014-06-27 – 2014-06-28 (×2): 40 mg via ORAL
  Filled 2014-06-27 (×2): qty 1

## 2014-06-27 NOTE — Progress Notes (Signed)
This patient is receiving Protonix. Based on criteria approved by the Pharmacy and Therapeutics Committee, this medication is being converted to the equivalent oral dose form. These criteria include:   . The patient is eating (either orally or per tube) and/or has been taking other orally administered medications for at least 24 hours.  . This patient has no evidence of active gastrointestinal bleeding or impaired GI absorption (gastrectomy, short bowel, patient on TNA or NPO).   If you have questions about this conversion, please contact the pharmacy department.  Kara Mead, [facility] 06/27/2014 7:46 AM

## 2014-06-27 NOTE — Progress Notes (Signed)
Patient ID: Tim Gordon, male   DOB: [birth year], 53 y.o.   MRN: [id number] 3 Days Post-Op  Subjective: No complaints this morning. He has had flatus and a couple of very small bowel movements. Pain well controlled on oral medications. No nausea, tolerating full liquids  Objective: Vital signs in last 24 hours: Temp:  [97.8 F (36.6 C)-99 F (37.2 C)] 97.8 F (36.6 C) (09/20 0542) Pulse Rate:  [52-76] 60 (09/20 0542) Resp:  [16-18] 18 (09/20 0542) BP: (98-130)/(70-84) 113/78 mmHg (09/20 0542) SpO2:  [96 %-99 %] 96 % (09/20 0542) Last BM Date: 06/26/14  Intake/Output from previous day: 09/19 0701 - 09/20 0700 In: 3420 [P.O.:1020; I.V.:2400] Out: 2130 [Urine:2025; Drains:105] Intake/Output this shift:    General appearance: alert, cooperative and no distress GI: normal findings: soft, non-tender Incision/Wound: dressings clean and dry. JP drainage serosanguineous  Lab Results:   Recent Labs  06/25/14 0459  WBC 10.2  HGB 14.0  HCT 41.8  PLT 156   BMET  Recent Labs  06/25/14 0459  NA 141  K 5.2  CL 103  CO2 27  GLUCOSE 157*  BUN 15  CREATININE 1.20  CALCIUM 8.5     Studies/Results: No results found.  Anti-infectives: Anti-infectives   Start     Dose/Rate Route Frequency Ordered Stop   06/24/14 2200  cefoTEtan (CEFOTAN) 2 g in dextrose 5 % 50 mL IVPB     2 g 100 mL/hr over 30 Minutes Intravenous Every 12 hours 06/24/14 1639 06/24/14 2344   06/24/14 0915  cefoTEtan in Dextrose 5% (CEFOTAN) IVPB 2 g     2 g Intravenous  Once 06/24/14 0912 06/24/14 1200      Assessment/Plan: s/p Procedure(s): LAPAROSCOPIC ASSISTED  PARTIAL COLECTOMY Doing well without apparent complication. Advance diet and increase activity. Anticipate discharge tomorrow.   LOS: 3 days    Jose Corvin T 06/27/2014

## 2014-06-28 MED ORDER — OXYCODONE HCL 5 MG PO TABS: 5.0000 mg | ORAL_TABLET | ORAL | Status: DC | PRN

## 2014-06-28 NOTE — Progress Notes (Signed)
Discharged teaching was completed.  JP drain removed by Dr. Zella Richer. No concerns voiced about discharge instructions. Will continue to monitor.    Fredrich Romans, RN

## 2014-06-28 NOTE — Discharge Instructions (Signed)
[facility], [place] [phone]  OPEN ABDOMINAL SURGERY: POST OP INSTRUCTIONS  Always review your discharge instruction sheet given to you by the facility where your surgery was performed.  IF YOU HAVE DISABILITY OR FAMILY LEAVE FORMS, YOU MUST BRING THEM TO THE OFFICE FOR PROCESSING.  PLEASE DO NOT GIVE THEM TO YOUR DOCTOR.  1. A prescription for pain medication may be given to you upon discharge.  Take your pain medication as prescribed, if needed.  If narcotic pain medicine is not needed, then you may take acetaminophen (Tylenol) or ibuprofen (Advil) as needed. 2. Take your usually prescribed medications unless otherwise directed. 3. If you need a refill on your pain medication, please contact your pharmacy. They will contact our office to request authorization.  Prescriptions will not be filled after 5pm or on week-ends. 4. You should follow a light, lowfat diet.  Stay well hydrated. 5. It is common to experience some constipation if taking pain medication after surgery.  Increasing fluid intake and taking a stool softener will usually help or prevent this problem from occurring.  A mild laxative (Milk of Magnesia or Miralax) should be taken according to package directions if there are no bowel movements after 48 hours. 6.  You may have steri-strips (small skin tapes) in place directly over the incision.  These strips should be left on the skin for 7-10 days.  If your surgeon used skin glue on the incision, you may shower in 24 hours.  The glue will flake off over the next 2-3 weeks.  Any sutures or staples will be removed at the office during your follow-up visit. You may find that a light gauze bandage over your incision may keep your staples from being rubbed or pulled. You may shower and replace the bandage daily. 7. ACTIVITIES:  You may resume regular (light) daily activities beginning the next day--such as daily self-care, walking, climbing stairs--gradually increasing  activities as tolerated.  You may have sexual intercourse when it is comfortable.  Refrain from any heavy lifting or straining-nothing over 10 pounds for 6 weeks. a. You may drive when you no longer are taking prescription pain medication, you can comfortably wear a seatbelt, and you can safely maneuver your car and apply brakes b. Return to Work: _When released by MD__________________________________ 8. You should see your doctor in the office for a follow-up appointment approximately two weeks after your surgery.  Make sure that you call for this appointment within a day or two after you arrive home to insure a convenient appointment time. OTHER INSTRUCTIONS:  _Return to the office for staple removal Friday, 9/25 at 10:30 am. ____________________________________________________________ _____________________________________________________________  WHEN TO CALL YOUR DOCTOR: 1. Fever over 101.0 2. Inability to urinate 3. Nausea and/or vomiting 4. Extreme swelling or bruising 5. Continued bleeding from incision. 6. Increased pain, redness, or drainage from the incision..  The clinic staff is available to answer your questions during regular business hours.  Please dont hesitate to call and ask to speak to one of the nurses if you have concerns.  For further questions, please visit [email or web address]

## 2014-06-28 NOTE — Progress Notes (Signed)
4 Days Post-Op  Subjective: Tolerating solid diet.  Bowels moving.  Ambulating.  Pain well controlled with oral analgesic.  Objective: Vital signs in last 24 hours: Temp:  [98.1 F (36.7 C)-98.7 F (37.1 C)] 98.7 F (37.1 C) (09/21 0530) Pulse Rate:  [57-65] 60 (09/21 0900) Resp:  [18-20] 18 (09/21 0530) BP: (134-139)/(78-90) 136/78 mmHg (09/21 0900) SpO2:  [94 %-99 %] 96 % (09/21 0530) Last BM Date: 06/28/14  Intake/Output from previous day: 09/20 0701 - 09/21 0700 In: 1224 [P.O.:720; I.V.:504] Out: 3933 [Urine:3800; Drains:133] Intake/Output this shift: Total I/O In: 280 [P.O.:240; I.V.:40] Out: 15 [Drains:15]  PE: General- In NAD Abdomen-soft, incisions are clean and intact, minimal serosanguinous drain output-drain removed.  Lab Results:  No results found for this basename: WBC, HGB, HCT, PLT,  in the last 72 hours BMET No results found for this basename: NA, K, CL, CO2, GLUCOSE, BUN, CREATININE, CALCIUM,  in the last 72 hours PT/INR No results found for this basename: LABPROT, INR,  in the last 72 hours Comprehensive Metabolic Panel:    Component Value Date/Time   NA 141 06/25/2014 0459   NA 141 06/18/2014 1400   K 5.2 06/25/2014 0459   K 4.2 06/18/2014 1400   CL 103 06/25/2014 0459   CL 104 06/18/2014 1400   CO2 27 06/25/2014 0459   CO2 25 06/18/2014 1400   BUN 15 06/25/2014 0459   BUN 13 06/18/2014 1400   CREATININE 1.20 06/25/2014 0459   CREATININE 0.97 06/18/2014 1400   GLUCOSE 157* 06/25/2014 0459   GLUCOSE 119* 06/18/2014 1400   CALCIUM 8.5 06/25/2014 0459   CALCIUM 9.4 06/18/2014 1400   AST 23 06/18/2014 1400   AST 20 02/26/2014 0858   ALT 31 06/18/2014 1400   ALT 28 02/26/2014 0858   ALKPHOS 88 06/18/2014 1400   ALKPHOS 55 02/26/2014 0858   BILITOT 1.3* 06/18/2014 1400   BILITOT 1.5* 02/26/2014 0858   PROT 7.2 06/18/2014 1400   PROT 7.5 02/26/2014 0858   ALBUMIN 4.1 06/18/2014 1400   ALBUMIN 4.4 02/26/2014 0858     Studies/Results: No results  found.  Anti-infectives: Anti-infectives   Start     Dose/Rate Route Frequency Ordered Stop   06/24/14 2200  cefoTEtan (CEFOTAN) 2 g in dextrose 5 % 50 mL IVPB     2 g 100 mL/hr over 30 Minutes Intravenous Every 12 hours 06/24/14 1639 06/24/14 2344   06/24/14 0915  cefoTEtan in Dextrose 5% (CEFOTAN) IVPB 2 g     2 g Intravenous  Once 06/24/14 0912 06/24/14 1200      Assessment Principal Problem:   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 06/25/14-progressing well Active Problems:   Essential tremor    LOS: 4 days   Plan: Discharge today.  Instructions given to him and his wife.  Return in 4 days for staple removal.   Tim Gordon J 06/28/2014

## 2014-07-13 ENCOUNTER — Telehealth: Payer: Self-pay | Admitting: Family Medicine

## 2014-07-13 NOTE — Telephone Encounter (Signed)
Refill once.  Avoid regular use. 

## 2014-07-13 NOTE — Telephone Encounter (Signed)
Last visit 6/191/15 Last refill 01/21/14 #90 1 refill

## 2014-07-13 NOTE — Telephone Encounter (Signed)
[facility] is requesting re-fill on clonazePAM (KLONOPIN) 0.5 MG tablet

## 2014-07-14 MED ORDER — CLONAZEPAM 0.5 MG PO TABS: 0.5000 mg | ORAL_TABLET | Freq: Every day | ORAL | Status: DC

## 2014-07-14 NOTE — Telephone Encounter (Signed)
Faxed RX to mail order.  

## 2014-07-14 NOTE — Addendum Note (Signed)
Addended by: Noe Gens E on: 07/14/2014 11:41 AM   Modules accepted: Orders

## 2014-07-15 ENCOUNTER — Other Ambulatory Visit (INDEPENDENT_AMBULATORY_CARE_PROVIDER_SITE_OTHER): Payer: Self-pay | Admitting: General Surgery

## 2014-07-15 DIAGNOSIS — R300 Dysuria: Secondary | ICD-10-CM

## 2014-07-16 ENCOUNTER — Telehealth (INDEPENDENT_AMBULATORY_CARE_PROVIDER_SITE_OTHER): Payer: Self-pay

## 2014-07-16 LAB — URINALYSIS, ROUTINE W REFLEX MICROSCOPIC
Bilirubin Urine: NEGATIVE
Glucose, UA: NEGATIVE mg/dL
Hgb urine dipstick: NEGATIVE
Ketones, ur: NEGATIVE mg/dL
LEUKOCYTES UA: NEGATIVE
NITRITE: NEGATIVE
PH: 6 (ref 5.0–8.0)
Protein, ur: NEGATIVE mg/dL
SPECIFIC GRAVITY, URINE: 1.008 (ref 1.005–1.030)
Urobilinogen, UA: 0.2 mg/dL (ref 0.0–1.0)

## 2014-07-16 NOTE — Telephone Encounter (Signed)
Message copied by Carlene Coria on Fri Jul 16, 2014  2:43 PM ------      Message from: Odis Hollingshead      Created: Fri Jul 16, 2014  1:11 PM       Please let him know that his urinalysis is normal and shows no signs of infection. ------

## 2014-07-16 NOTE — Telephone Encounter (Signed)
Called pt with path

## 2014-07-21 NOTE — Discharge Summary (Signed)
Physician Discharge Summary  Patient ID: Tim Gordon MRN: [id number] DOB/AGE: 02-23-1961 53 y.o.  Admit date: 06/24/2014 Discharge date: 06/28/2014  Admission Diagnoses:  Recurrent sigmoid diverticulitis  Discharge Diagnoses:  Principal Problem:   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 06/24/14 Active Problems:   Essential tremor   Discharged Condition: good  Hospital Course: he was admitted the morning of his surgery and underwent a laparoscopic-assisted sigmoid colectomy.  He did well postoperatively. His diet was slowly able to be advanced. He was ambulatory. The wounds were healing well. Drain output was thin and serosanguineous. By his fourth postoperative day he had return of bowel function, he was tolerating a solid diet, his drain was removed, and he was discharged to home. Discharge instructions were given to him. He will follow up in the office to have his staples removed.  Consults: None  Significant Diagnostic Studies: none  Treatments: surgery: laparoscopic-assisted sigmoid colectomy  Discharge Exam: Blood pressure 136/78, pulse 60, temperature 98.7 F (37.1 C), temperature source Oral, resp. rate 18, height 5\' 9"  (1.753 m), weight 187 lb (84.823 kg), SpO2 96.00%.   Disposition: 01-Home or Self Care     Medication List    STOP taking these medications       AUGMENTIN PO     Fiber Powd     FLAGYL PO      TAKE these medications       KLS ALLER-TEC 10 MG tablet  Generic drug:  cetirizine  Take 10 mg by mouth at bedtime.     naproxen sodium 220 MG tablet  Commonly known as:  ANAPROX  Take 440 mg by mouth once as needed (muscle pain.).     omega-3 acid ethyl esters 1 G capsule  Commonly known as:  LOVAZA  Take 2 g by mouth 2 (two) times daily.     oxyCODONE 5 MG immediate release tablet  Commonly known as:  Oxy IR/ROXICODONE  Take 1-2 tablets (5-10 mg total) by mouth every 4 (four) hours as needed for moderate pain, severe pain  or breakthrough pain.     Pantothenic Acid 250 MG Caps  Take 250 mg by mouth 2 (two) times daily.     PROBIOTIC DAILY PO  Take 1 capsule by mouth every morning. Ultra Flora IB     propranolol 20 MG tablet  Commonly known as:  INDERAL  Take 20 mg by mouth every morning.     vitamin B-12 1000 MCG tablet  Commonly known as:  CYANOCOBALAMIN  Take 1,000 mcg by mouth every morning.         Signed: Odis Hollingshead 07/21/2014, 8:28 AM

## 2014-07-29 ENCOUNTER — Telehealth (INDEPENDENT_AMBULATORY_CARE_PROVIDER_SITE_OTHER): Payer: Self-pay

## 2014-07-29 NOTE — Telephone Encounter (Signed)
Pt is s/p colectomy by Dr. Zella Richer.  He is calling today c/o a small, "pea size" lump to the left of his incision which he states is painful.  No swelling, fever or chills.  Pt has been going up and down stairs at home, but otherwise, no strenuous activity.  Pt scheduled to be seen in Urgent Office by Dr. Grandville Silos today.

## 2014-08-15 ENCOUNTER — Other Ambulatory Visit: Payer: Self-pay | Admitting: Family Medicine

## 2014-11-02 ENCOUNTER — Telehealth: Payer: Self-pay | Admitting: Family Medicine

## 2014-11-02 NOTE — Telephone Encounter (Signed)
Last visit 03/26/14 Last refill 07/14/14 #90 0 refill

## 2014-11-02 NOTE — Telephone Encounter (Signed)
[facility] is requesting a re-fill on clonazePAM (KLONOPIN) 0.5 MG tablet.

## 2014-11-02 NOTE — Telephone Encounter (Signed)
Refill OK

## 2014-11-03 MED ORDER — CLONAZEPAM 0.5 MG PO TABS: 0.5000 mg | ORAL_TABLET | Freq: Every day | ORAL | Status: DC

## 2014-11-03 NOTE — Telephone Encounter (Signed)
Faxed RX to mail order.  

## 2014-11-05 ENCOUNTER — Telehealth: Payer: Self-pay | Admitting: Family Medicine

## 2014-11-05 MED ORDER — CLONAZEPAM 0.5 MG PO TABS: 0.5000 mg | ORAL_TABLET | Freq: Every day | ORAL | Status: DC

## 2014-11-05 NOTE — Telephone Encounter (Signed)
Last visit 03/26/14 Last refill 11/03/14 #90 0 refill

## 2014-11-05 NOTE — Telephone Encounter (Signed)
Called in Rx to pharmacy. Pt is aware.

## 2014-11-05 NOTE — Telephone Encounter (Signed)
Yes patient is aware. Rx was sent to mail order.

## 2014-11-05 NOTE — Telephone Encounter (Signed)
Please clarify. This was refilled 2 days ago. Is patient aware?

## 2014-11-05 NOTE — Telephone Encounter (Signed)
Pt request refill of the following: clonazePAM (KLONOPIN) 0.5 MG tablet   Pt said his rx was messed up and he is traveling on Mon and can not wait for them to send      Phamacy: [facility]  [place]

## 2014-11-05 NOTE — Telephone Encounter (Signed)
May refill #30 

## 2014-11-05 NOTE — Telephone Encounter (Signed)
Pt needs #30 sent to [facility]

## 2015-01-24 ENCOUNTER — Ambulatory Visit (INDEPENDENT_AMBULATORY_CARE_PROVIDER_SITE_OTHER): Payer: BLUE CROSS/BLUE SHIELD | Admitting: Family Medicine

## 2015-01-24 ENCOUNTER — Encounter: Payer: Self-pay | Admitting: Family Medicine

## 2015-01-24 VITALS — BP 130/80 | HR 68 | Temp 98.4°F | Wt 194.0 lb

## 2015-01-24 DIAGNOSIS — J018 Other acute sinusitis: Secondary | ICD-10-CM | POA: Diagnosis not present

## 2015-01-24 MED ORDER — AMOXICILLIN 875 MG PO TABS: 875.0000 mg | ORAL_TABLET | Freq: Two times a day (BID) | ORAL | Status: DC

## 2015-01-24 NOTE — Progress Notes (Signed)
   Subjective:    Patient ID: Tim Gordon, male    DOB: 1961/02/14, 54 y.o.   MRN: [id number]  HPI Acute visit. Patient seen with approximate one-week history of greenish sinus drainage and mostly right frontal sinus pressure. Has had occasional cough. Increased malaise. Intermittent headaches. Taking over-the-counter decongestant without much improvement. He is concerned regarding acute sinusitis. No nausea or vomiting  Past Medical History  Diagnosis Date  . Blood in stool   . Allergy   . Hyperlipidemia   . Diverticulitis   . Heart murmur     MVP - NEVER CAUSES ANY PROBLEMS; PT VERY ACTIVE - CAN PLAY RACKETBALL FOR COUPLE OF HOURS AND NEVER ANY CHEST C/O  . GERD (gastroesophageal reflux disease)     CONTROLLED WITH DIET - NO MEDS  . Migraines     OCULAR MIGRAINES  . History of kidney stones   . Sleep apnea     STATES SLEEP STUDY 4 OR 5 YEARS AGO - TOLD BORDERLINE - TRIED CPAP FOR 30 DAYS - STATES IT DID NOT HELP - NO LONGER USES.   Past Surgical History  Procedure Laterality Date  . Appendectomy    . Vasectomy    . Spine surgery  2001    rupture L4-L5  . Nasal reconstruction with septal repair    . Laparoscopic partial colectomy N/A 06/24/2014    Procedure: LAPAROSCOPIC ASSISTED  PARTIAL COLECTOMY;  Surgeon: Jackolyn Confer, MD;  Location: [facility];  Service: General;  Laterality: N/A;    reports that he has never smoked. He has never used smokeless tobacco. He reports that he drinks alcohol. He reports that he does not use illicit drugs. family history includes Arthritis in his mother; Cancer in his maternal grandmother; Cancer (age of onset: 29) in his father; Heart disease in his mother; Hyperlipidemia in his mother; Hypertension in his father and mother; Stroke in his mother and paternal grandfather. Allergies  Allergen Reactions  . Biaxin [Clarithromycin] Other (See Comments)    "went right through me"      Review of Systems  Constitutional: Positive for fatigue.  Negative for fever and chills.  HENT: Positive for congestion and sinus pressure.   Respiratory: Positive for cough.   Neurological: Positive for headaches.       Objective:   Physical Exam  Constitutional: He appears well-developed and well-nourished.  HENT:  Right Ear: External ear normal.  Left Ear: External ear normal.  Mouth/Throat: Oropharynx is clear and moist.  Neck: Neck supple.  Cardiovascular: Normal rate and regular rhythm.   Pulmonary/Chest: Effort normal and breath sounds normal. No respiratory distress. He has no wheezes. He has no rales.  Lymphadenopathy:    He has no cervical adenopathy.          Assessment & Plan:  Patient seen with acute sinusitis symptoms. We explained that most acute sinusitis is viral. We've recommend observation. If he develops any progressive or persistent symptoms start amoxicillin 875 mg twice daily for 10 days. Continue good hydration and over-the-counter decongestant as needed

## 2015-01-24 NOTE — Progress Notes (Signed)
Pre visit review using our clinic review tool, if applicable. No additional management support is needed unless otherwise documented below in the visit note. 

## 2015-02-04 ENCOUNTER — Telehealth: Payer: Self-pay

## 2015-02-04 NOTE — Telephone Encounter (Signed)
Clonazepam  Last visit 01/24/15 Last refill 11/05/14 #30 0 refill   #[facility]

## 2015-02-06 NOTE — Telephone Encounter (Signed)
Refill OK

## 2015-02-07 MED ORDER — CLONAZEPAM 0.5 MG PO TABS: 0.5000 mg | ORAL_TABLET | Freq: Every day | ORAL | Status: DC

## 2015-02-07 NOTE — Telephone Encounter (Signed)
Faxed Rx to mail order.  

## 2015-03-25 ENCOUNTER — Other Ambulatory Visit (INDEPENDENT_AMBULATORY_CARE_PROVIDER_SITE_OTHER): Payer: BLUE CROSS/BLUE SHIELD

## 2015-03-25 DIAGNOSIS — Z0000 Encounter for general adult medical examination without abnormal findings: Secondary | ICD-10-CM

## 2015-03-25 DIAGNOSIS — R7989 Other specified abnormal findings of blood chemistry: Secondary | ICD-10-CM | POA: Diagnosis not present

## 2015-03-25 LAB — CBC WITH DIFFERENTIAL/PLATELET
BASOS ABS: 0 10*3/uL (ref 0.0–0.1)
Basophils Relative: 0.6 % (ref 0.0–3.0)
EOS PCT: 2.3 % (ref 0.0–5.0)
Eosinophils Absolute: 0.1 10*3/uL (ref 0.0–0.7)
HCT: 46.4 % (ref 39.0–52.0)
Hemoglobin: 15.7 g/dL (ref 13.0–17.0)
Lymphocytes Relative: 37.5 % (ref 12.0–46.0)
Lymphs Abs: 1.1 10*3/uL (ref 0.7–4.0)
MCHC: 33.9 g/dL (ref 30.0–36.0)
MCV: 93.3 fl (ref 78.0–100.0)
Monocytes Absolute: 0.3 10*3/uL (ref 0.1–1.0)
Monocytes Relative: 10.8 % (ref 3.0–12.0)
NEUTROS PCT: 48.8 % (ref 43.0–77.0)
Neutro Abs: 1.5 10*3/uL (ref 1.4–7.7)
Platelets: 166 10*3/uL (ref 150.0–400.0)
RBC: 4.98 Mil/uL (ref 4.22–5.81)
RDW: 13.3 % (ref 11.5–15.5)
WBC: 3 10*3/uL — AB (ref 4.0–10.5)

## 2015-03-25 LAB — BASIC METABOLIC PANEL
BUN: 13 mg/dL (ref 6–23)
CHLORIDE: 104 meq/L (ref 96–112)
CO2: 30 mEq/L (ref 19–32)
Calcium: 9.5 mg/dL (ref 8.4–10.5)
Creatinine, Ser: 1.07 mg/dL (ref 0.40–1.50)
GFR: 76.76 mL/min (ref >60.00)
Glucose, Bld: 96 mg/dL (ref 70–99)
POTASSIUM: 4.8 meq/L (ref 3.5–5.1)
SODIUM: 139 meq/L (ref 135–145)

## 2015-03-25 LAB — HEPATIC FUNCTION PANEL
ALT: 20 U/L (ref 0–53)
AST: 19 U/L (ref 0–37)
Albumin: 4.5 g/dL (ref 3.5–5.2)
Alkaline Phosphatase: 69 U/L (ref 39–117)
BILIRUBIN TOTAL: 1.2 mg/dL (ref 0.2–1.2)
Bilirubin, Direct: 0.2 mg/dL (ref 0.0–0.3)
Total Protein: 6.6 g/dL (ref 6.0–8.3)

## 2015-03-25 LAB — LIPID PANEL
CHOL/HDL RATIO: 7
CHOLESTEROL: 223 mg/dL — AB (ref 0–200)
HDL: 31.8 mg/dL — ABNORMAL LOW (ref >39.00)
NonHDL: 191.2
TRIGLYCERIDES: 214 mg/dL — AB (ref 0.0–149.0)
VLDL: 42.8 mg/dL — ABNORMAL HIGH (ref 0.0–40.0)

## 2015-03-25 LAB — TSH: TSH: 2.55 u[IU]/mL (ref 0.35–4.50)

## 2015-03-25 LAB — LDL CHOLESTEROL, DIRECT: Direct LDL: 142 mg/dL

## 2015-03-25 LAB — PSA: PSA: 1.09 ng/mL (ref 0.10–4.00)

## 2015-04-01 ENCOUNTER — Encounter: Payer: Self-pay | Admitting: Family Medicine

## 2015-04-01 ENCOUNTER — Ambulatory Visit (INDEPENDENT_AMBULATORY_CARE_PROVIDER_SITE_OTHER): Payer: BLUE CROSS/BLUE SHIELD | Admitting: Family Medicine

## 2015-04-01 VITALS — BP 134/80 | HR 64 | Temp 98.0°F | Ht 69.29 in | Wt 194.0 lb

## 2015-04-01 DIAGNOSIS — Z0000 Encounter for general adult medical examination without abnormal findings: Secondary | ICD-10-CM | POA: Diagnosis not present

## 2015-04-01 NOTE — Patient Instructions (Signed)

## 2015-04-01 NOTE — Progress Notes (Signed)
Pre visit review using our clinic review tool, if applicable. No additional management support is needed unless otherwise documented below in the visit note. 

## 2015-04-01 NOTE — Progress Notes (Signed)
Subjective:    Patient ID: Tim Gordon, male    DOB: 15-Oct-1960, 54 y.o.   MRN: [id number]  HPI  Patient here for complete physical. Had partial colectomy last year secondary to recurrent diverticulitis. has done well since that time. He is conscious to eat high-fiber diet. No recent abdominal pain. He has some chronic insomnia and takes clonazepam. His immunizations are up-to-date. Colonoscopy up-to-date. Never smoked  Past Medical History  Diagnosis Date  . Blood in stool   . Allergy   . Hyperlipidemia   . Diverticulitis   . Heart murmur     MVP - NEVER CAUSES ANY PROBLEMS; PT VERY ACTIVE - CAN PLAY RACKETBALL FOR COUPLE OF HOURS AND NEVER ANY CHEST C/O  . GERD (gastroesophageal reflux disease)     CONTROLLED WITH DIET - NO MEDS  . Migraines     OCULAR MIGRAINES  . History of kidney stones   . Sleep apnea     STATES SLEEP STUDY 4 OR 5 YEARS AGO - TOLD BORDERLINE - TRIED CPAP FOR 30 DAYS - STATES IT DID NOT HELP - NO LONGER USES.   Past Surgical History  Procedure Laterality Date  . Appendectomy    . Vasectomy    . Spine surgery  2001    rupture L4-L5  . Nasal reconstruction with septal repair    . Laparoscopic partial colectomy N/A 06/24/2014    Procedure: LAPAROSCOPIC ASSISTED  PARTIAL COLECTOMY;  Surgeon: Jackolyn Confer, MD;  Location: [facility];  Service: General;  Laterality: N/A;  . Colon surgery      reports that he has never smoked. He has never used smokeless tobacco. He reports that he drinks alcohol. He reports that he does not use illicit drugs. family history includes Arthritis in his mother; Cancer in his maternal grandmother; Cancer (age of onset: 60) in his father; Heart disease in his mother; Hyperlipidemia in his mother; Hypertension in his father and mother; Stroke in his mother and paternal grandfather. Allergies  Allergen Reactions  . Biaxin [Clarithromycin] Other (See Comments)    "went right through me"     Review of Systems  Constitutional:  Negative for fever, activity change, appetite change and fatigue.  HENT: Negative for congestion, ear pain and trouble swallowing.   Eyes: Negative for pain and visual disturbance.  Respiratory: Negative for cough, shortness of breath and wheezing.   Cardiovascular: Negative for chest pain and palpitations.  Gastrointestinal: Negative for nausea, vomiting, abdominal pain, diarrhea, constipation, blood in stool, abdominal distention and rectal pain.  Genitourinary: Negative for dysuria, hematuria and testicular pain.  Musculoskeletal: Negative for joint swelling and arthralgias.  Skin: Negative for rash.  Neurological: Negative for dizziness, syncope and headaches.  Hematological: Negative for adenopathy.  Psychiatric/Behavioral: Negative for confusion and dysphoric mood.       Objective:   Physical Exam  Constitutional: He is oriented to person, place, and time. He appears well-developed and well-nourished. No distress.  HENT:  Head: Normocephalic and atraumatic.  Right Ear: External ear normal.  Left Ear: External ear normal.  Mouth/Throat: Oropharynx is clear and moist.  Eyes: Conjunctivae and EOM are normal. Pupils are equal, round, and reactive to light.  Neck: Normal range of motion. Neck supple. No thyromegaly present.  Cardiovascular: Normal rate, regular rhythm and normal heart sounds.   No murmur heard. Pulmonary/Chest: No respiratory distress. He has no wheezes. He has no rales.  Abdominal: Soft. Bowel sounds are normal. He exhibits no distension and no mass. There  is no tenderness. There is no rebound and no guarding.  Musculoskeletal: He exhibits no edema.  Lymphadenopathy:    He has no cervical adenopathy.  Neurological: He is alert and oriented to person, place, and time. He displays normal reflexes. No cranial nerve deficit.  Skin: No rash noted.  Psychiatric: He has a normal mood and affect.          Assessment & Plan:  Complete physical. Labs reviewed. He  has dyslipidemia and we discussed dietary and exercise factors to assist with this. His 10 year risk for CAD event is 10%. He declines statin use after discussion of risk and benefits.

## 2015-05-05 ENCOUNTER — Other Ambulatory Visit: Payer: Self-pay | Admitting: Family Medicine

## 2015-05-06 ENCOUNTER — Other Ambulatory Visit: Payer: Self-pay | Admitting: Family Medicine

## 2015-05-06 NOTE — Telephone Encounter (Signed)
Last OV 04/01/15, Medication last filled on 02/07/15 #90 with 0 refills.

## 2015-05-07 NOTE — Telephone Encounter (Signed)
Refill with one additional refill. 

## 2015-05-09 MED ORDER — CLONAZEPAM 0.5 MG PO TABS: 0.5000 mg | ORAL_TABLET | Freq: Every day | ORAL | Status: DC

## 2015-05-09 NOTE — Telephone Encounter (Signed)
Forwarding due to controlled

## 2015-05-09 NOTE — Telephone Encounter (Signed)
Please fill for patient. Thanks

## 2015-05-12 ENCOUNTER — Other Ambulatory Visit: Payer: Self-pay | Admitting: *Deleted

## 2015-05-31 ENCOUNTER — Other Ambulatory Visit: Payer: Self-pay | Admitting: Family Medicine

## 2015-08-17 ENCOUNTER — Emergency Department (HOSPITAL_COMMUNITY)
Admission: EM | Admit: 2015-08-17 | Discharge: 2015-08-17 | Disposition: A | Discharge status: Home / Self Care | Payer: BLUE CROSS/BLUE SHIELD | Attending: Emergency Medicine | Admitting: Emergency Medicine

## 2015-08-17 ENCOUNTER — Emergency Department (HOSPITAL_COMMUNITY): Payer: BLUE CROSS/BLUE SHIELD

## 2015-08-17 ENCOUNTER — Encounter (HOSPITAL_COMMUNITY): Payer: Self-pay | Admitting: *Deleted

## 2015-08-17 DIAGNOSIS — Y9373 Activity, racquet and hand sports: Secondary | ICD-10-CM | POA: Diagnosis not present

## 2015-08-17 DIAGNOSIS — Z79899 Other long term (current) drug therapy: Secondary | ICD-10-CM | POA: Diagnosis not present

## 2015-08-17 DIAGNOSIS — W01198A Fall on same level from slipping, tripping and stumbling with subsequent striking against other object, initial encounter: Secondary | ICD-10-CM | POA: Diagnosis not present

## 2015-08-17 DIAGNOSIS — Y998 Other external cause status: Secondary | ICD-10-CM | POA: Diagnosis not present

## 2015-08-17 DIAGNOSIS — Z87442 Personal history of urinary calculi: Secondary | ICD-10-CM | POA: Diagnosis not present

## 2015-08-17 DIAGNOSIS — G43909 Migraine, unspecified, not intractable, without status migrainosus: Secondary | ICD-10-CM | POA: Diagnosis not present

## 2015-08-17 DIAGNOSIS — Z8639 Personal history of other endocrine, nutritional and metabolic disease: Secondary | ICD-10-CM | POA: Insufficient documentation

## 2015-08-17 DIAGNOSIS — R011 Cardiac murmur, unspecified: Secondary | ICD-10-CM | POA: Diagnosis not present

## 2015-08-17 DIAGNOSIS — Z862 Personal history of diseases of the blood and blood-forming organs and certain disorders involving the immune mechanism: Secondary | ICD-10-CM | POA: Insufficient documentation

## 2015-08-17 DIAGNOSIS — Y9289 Other specified places as the place of occurrence of the external cause: Secondary | ICD-10-CM | POA: Diagnosis not present

## 2015-08-17 DIAGNOSIS — S43101A Unspecified dislocation of right acromioclavicular joint, initial encounter: Secondary | ICD-10-CM | POA: Diagnosis not present

## 2015-08-17 DIAGNOSIS — Z8719 Personal history of other diseases of the digestive system: Secondary | ICD-10-CM | POA: Insufficient documentation

## 2015-08-17 DIAGNOSIS — S4991XA Unspecified injury of right shoulder and upper arm, initial encounter: Secondary | ICD-10-CM | POA: Diagnosis present

## 2015-08-17 DIAGNOSIS — M25511 Pain in right shoulder: Secondary | ICD-10-CM

## 2015-08-17 MED ORDER — HYDROCODONE-ACETAMINOPHEN 5-325 MG PO TABS: 1.0000 | ORAL_TABLET | Freq: Four times a day (QID) | ORAL | Status: DC | PRN

## 2015-08-17 MED ORDER — NAPROXEN 500 MG PO TABS: 500.0000 mg | ORAL_TABLET | Freq: Two times a day (BID) | ORAL | Status: DC | PRN

## 2015-08-17 MED ORDER — HYDROCODONE-ACETAMINOPHEN 5-325 MG PO TABS
1.0000 | ORAL_TABLET | Freq: Once | ORAL | Status: AC
  Administered 2015-08-17: 1 via ORAL
  Filled 2015-08-17: qty 1

## 2015-08-17 NOTE — ED Notes (Signed)
Patient d/c'd home with wife.  Wife is driving.  Discussed f/u and medication with patient and wife.  Both verbalized understanding.

## 2015-08-17 NOTE — ED Notes (Addendum)
Pt today at 1700 was playing raquetball, tripped and slammed right shoulder into wall. Now right thumb is numb. Pain 3/10.

## 2015-08-17 NOTE — Discharge Instructions (Signed)
Wear shoulder sling until you see the orthopedist. Ice your shoulder throughout the day, using an ice pack for 20 minutes at a time every hour. Alternate between naprosyn and norco for pain relief. Do not drive or operate machinery with pain medication use. Call your orthopedic doctor today or tomorrow to schedule followup appointment for recheck of ongoing shoulder pain in 1 week for ongoing management of your shoulder AC joint separation. Return to the ER for changes or worsening symptoms.    Acromioclavicular Separation With Rehab The acromioclavicular joint is the joint between the roof of the shoulder (acromion) and the collarbone (clavicle). It is vulnerable to injury. An acromioclavicular [facility]) separation is a partial or complete tear (sprain), injury, or redness and soreness (inflammation) of the ligaments that cross the acromioclavicular joint and hold it in place. There are two ligaments in this area that are vulnerable to injury, the acromioclavicular ligament and the coracoclavicular ligament. SYMPTOMS   Tenderness and swelling, or a bump on top of the shoulder (at the [facility] joint).  Bruising (contusion) in the area within 48 hours of injury.  Loss of strength or pain when reaching over the head or across the body. CAUSES  AC separation is caused by direct trauma to the joint (falling on your shoulder) or indirect trauma (falling on an outstretched arm). RISK INCREASES WITH:  Sports that require contact or collision, throwing sports (i.e. racquetball, squash).  Poor strength and flexibility.  Previous shoulder sprain or dislocation.  Poorly fitted or padded protective equipment. PREVENTION   Warm-up and stretch properly before activity.  Maintain physical fitness:  Shoulder strength.  Shoulder flexibility.  Cardiovascular fitness.  Wear properly fitted and padded protective equipment.  Learn and use proper technique when playing sports. Have a coach correct improper  technique, including falling and landing.  Apply taping, protective strapping or padding, or an adhesive bandage as recommended before practice or competition. PROGNOSIS   If treated properly, the symptoms of AC separation can be expected to go away.  If treated improperly, permanent disability may occur unless surgery is performed.  Healing time varies with type of sport and position, arm injured (dominant versus non-dominant) and severity of sprain. RELATED COMPLICATIONS  Weakness and fatigue of the arm or shoulder are possible but uncommon.  Pain and inflammation of the [facility] joint may continue.  Prolonged healing time may be necessary if usual activities are resumed too early. This causes a susceptibility to recurrent injury.  Prolonged disability may occur.  The shoulder may remain unstable or arthritic following repeated injury. TREATMENT  Treatment initially involves ice and medication to help reduce pain and inflammation. It may also be necessary to modify your activities in order to prevent further injury. Both non-surgical and surgical interventions exist to treat AC separation. Non-surgical intervention is usually recommended and involves wearing a sling to immobilize the joint for a period of time to allow for healing. Surgical intervention is usually only considered for severe sprains of the ligament or for individuals who do not improve after 2 to 6 months of non-surgical treatment. Surgical interventions require 4 to 6 months before a return to sports is possible. MEDICATION  If pain medication is necessary, nonsteroidal anti-inflammatory medications, such as aspirin and ibuprofen, or other minor pain relievers, such as acetaminophen, are often recommended.  Do not take pain medication for 7 days before surgery.  Prescription pain relievers may be given by your caregiver. Use only as directed and only as much as you need.  Ointments applied to the skin may be  helpful.  Corticosteroid injections may be given to reduce inflammation. HEAT AND COLD  Cold treatment (icing) relieves pain and reduces inflammation. Cold treatment should be applied for 10 to 15 minutes every 2 to 3 hours for inflammation and pain and immediately after any activity that aggravates your symptoms. Use ice packs or an ice massage.  Heat treatment may be used prior to performing the stretching and strengthening activities prescribed by your caregiver, physical therapist or athletic trainer. Use a heat pack or a warm soak. SEEK IMMEDIATE MEDICAL CARE IF:   Pain, swelling or bruising worsens despite treatment.  There is pain, numbness or coldness in the arm.  Discoloration appears in the fingernails.  New, unexplained symptoms develop.  Cryotherapy Cryotherapy is when you put ice on your injury. Ice helps lessen pain and puffiness (swelling) after an injury. Ice works the best when you start using it in the first 24 to 48 hours after an injury. HOME CARE  Put a dry or damp towel between the ice pack and your skin.  You may press gently on the ice pack.  Leave the ice on for no more than 10 to 20 minutes at a time.  Check your skin after 5 minutes to make sure your skin is okay.  Rest at least 20 minutes between ice pack uses.  Stop using ice when your skin loses feeling (numbness).  Do not use ice on someone who cannot tell you when it hurts. This includes small children and people with memory problems (dementia). GET HELP RIGHT AWAY IF:  You have white spots on your skin.  Your skin turns blue or pale.  Your skin feels waxy or hard.  Your puffiness gets worse. MAKE SURE YOU:   Understand these instructions.  Will watch your condition.  Will get help right away if you are not doing well or get worse.   This information is not intended to replace advice given to you by your health care provider. Make sure you discuss any questions you have with your  health care provider.   Document Released: 03/12/2008 Document Revised: 12/17/2011 Document Reviewed: 05/17/2011 Elsevier Interactive Patient Education 2016 [employer]  Shoulder Separation A shoulder separation (acromioclavicular separation) is an injury to the connecting tissue (ligament) between the top of your shoulder blade (acromion) and your collarbone (clavicle). The ligament may be stretched, partially torn, or completely torn.  A stretched ligament may not cause very much pain, and it does not move the collarbone out of place. A stretched ligament looks normal on an X-ray.  An injury that is a bit worse may partially tear a ligament and move the collarbone slightly out of place.  A serious injury completely tears both shoulder ligaments. This moves the collarbone severely out of position and changes the way that the shoulder looks (deformity). CAUSES The most common cause of a shoulder separation is falling on or receiving a blow to the top of the shoulder. Falling with an outstretched arm may also cause this injury. RISK FACTORS You may be at greater risk of a shoulder separation if:  You are male.  You are younger than age 50.  You play a contact sport, such as football or hockey. SIGNS AND SYMPTOMS The most common symptom of a shoulder separation is pain on the top of the shoulder after falling on it or receiving a blow to it. Other signs and symptoms include:  Shoulder deformity.  Swelling of  the shoulder.  Decreased ability to move the shoulder.  Bruising on top of the shoulder. DIAGNOSIS Your health care provider may suspect a shoulder separation based on your symptoms and the details of a recent injury. A physical exam will be done. During this exam, the health care provider may:  Press on your shoulder.  Test the movement of your shoulder.  Ask you to hold a weight in your hand to see if the separation increases.  Do an X-ray. TREATMENT  A stretch  injury may require only a sling, pain medicine, and cold packs. This treatment may last for 2-12 weeks. You may also have physical therapy. A physical therapist will teach you to do daily exercises to strengthen your shoulder muscles and prevent stiffness.  A complete tear may require surgery to repair the torn ligament. After surgery, you will also require a sling, pain medicine, and cold packs. Recovery may take longer. You may also need more physical therapy. HOME CARE INSTRUCTIONS  Take medicines only as directed by your health care provider.  Apply ice to the top of your shoulder:  Put ice in a plastic bag.  Place a towel between your skin and the bag.  Leave the ice on for 20 minutes, 2-3 times a day.  Wear your sling or splint as directed by your health care provider.  You may be able to remove your sling to do your physical therapy exercises.  Ask your health care provider when you can stop wearing the sling.  Do not do any activities that make your pain worse.  Do not lift anything that is heavier than 10 lb (4.5 kg) on the injured side of your body.  Ask your health care provider when you can return to athletic activities. SEEK MEDICAL CARE IF:  Your pain medicine is not relieving your pain.  Your pain and stiffness are not improving after 2 weeks.  You are unable to do your physical therapy exercises because of pain or stiffness.   This information is not intended to replace advice given to you by your health care provider. Make sure you discuss any questions you have with your health care provider.   Document Released: 07/04/2005 Document Revised: 10/15/2014 Document Reviewed: 02/24/2014 Elsevier Interactive Patient Education [employer].

## 2015-08-17 NOTE — ED Provider Notes (Signed)
CSN: [id number]     Arrival date & time 08/17/15  1750 History   First MD Initiated Contact with Patient 08/17/15 1842     Chief Complaint  Patient presents with  . Shoulder Injury   . Thumb Numbness      (Consider location/radiation/quality/duration/timing/severity/associated sxs/prior Treatment) HPI Comments: Tim Gordon is a 54 y.o. male with a PMHx of anemia, HLD, diverticulosis, GERD, migraines, and nephrolithiasis, who presents to the ED with complaints of right shoulder injury around 5 PM. Patient reports he was playing racquetball and fell onto his right shoulder, and ever since then he has noticed that the clavicle seems to move abnormally. He reports the pain is currently 2/10 constant shooting pain in the right shoulder radiating to the right trapezius and right thumb, worse with movement, with nitric tried prior to arrival. He denies any prior injuries to this shoulder. He reports some tingling in the right thumb. He denies any bruising, abrasions, swelling, recent fevers or chills, chest pain, shortness breath, abdominal pain, nausea, other injuries, neck or back pain, numbness, or weakness. He has an orthopedist for some chronic L shoulder pain, but has never had an injury.  Patient is a 54 y.o. male presenting with shoulder injury. The history is provided by the patient. No language interpreter was used.  Shoulder Injury This is a new problem. The current episode started today. The problem occurs constantly. The problem has been unchanged. Associated symptoms include arthralgias (R shoulder). Pertinent negatives include no abdominal pain, chest pain, chills, fever, joint swelling, myalgias, nausea, neck pain, numbness, vomiting or weakness. Exacerbated by: movement. He has tried nothing for the symptoms. The treatment provided no relief.    Past Medical History  Diagnosis Date  . Blood in stool   . Allergy   . Hyperlipidemia   . Diverticulitis   . Heart murmur     MVP -  NEVER CAUSES ANY PROBLEMS; PT VERY ACTIVE - CAN PLAY RACKETBALL FOR COUPLE OF HOURS AND NEVER ANY CHEST C/O  . GERD (gastroesophageal reflux disease)     CONTROLLED WITH DIET - NO MEDS  . Migraines     OCULAR MIGRAINES  . History of kidney stones   . Sleep apnea     STATES SLEEP STUDY 4 OR 5 YEARS AGO - TOLD BORDERLINE - TRIED CPAP FOR 30 DAYS - STATES IT DID NOT HELP - NO LONGER USES.   Past Surgical History  Procedure Laterality Date  . Appendectomy    . Vasectomy    . Spine surgery  2001    rupture L4-L5  . Nasal reconstruction with septal repair    . Laparoscopic partial colectomy N/A 06/24/2014    Procedure: LAPAROSCOPIC ASSISTED  PARTIAL COLECTOMY;  Surgeon: Jackolyn Confer, MD;  Location: [facility];  Service: General;  Laterality: N/A;  . Colon surgery     Family History  Problem Relation Age of Onset  . Arthritis Mother   . Hyperlipidemia Mother   . Stroke Mother   . Hypertension Mother   . Heart disease Mother     valvular heart disease ? which valve.  . Cancer Father 44    prostate  . Hypertension Father   . Cancer Maternal Grandmother     ovarian  . Stroke Paternal Grandfather    Social History  Substance Use Topics  . Smoking status: Never Smoker   . Smokeless tobacco: Never Used  . Alcohol Use: Yes     Comment: RARE ALCOHOL  Review of Systems  Constitutional: Negative for fever and chills.  Respiratory: Negative for shortness of breath.   Cardiovascular: Negative for chest pain.  Gastrointestinal: Negative for nausea, vomiting, abdominal pain and diarrhea.  Musculoskeletal: Positive for arthralgias (R shoulder). Negative for myalgias, back pain, joint swelling and neck pain.  Skin: Negative for color change and wound.  Allergic/Immunologic: Negative for immunocompromised state.  Neurological: Negative for weakness and numbness.       +tingling in R thumb  Psychiatric/Behavioral: Negative for confusion.   10 Systems reviewed and are negative for acute  change except as noted in the HPI.    Allergies  Biaxin  Home Medications   Prior to Admission medications   Medication Sig Start Date End Date Taking? Authorizing Provider  cetirizine (KLS ALLER-TEC) 10 MG tablet Take 10 mg by mouth at bedtime.     Historical Provider, MD  clonazePAM (KLONOPIN) 0.5 MG tablet Take 1 tablet (0.5 mg total) by mouth at bedtime. 05/09/15   Eulas Post, MD  omega-3 acid ethyl esters (LOVAZA) 1 G capsule TAKE 2 CAPSULES TWICE A DAY 05/31/15   Eulas Post, MD  Pantothenic Acid 250 MG CAPS Take 250 mg by mouth 2 (two) times daily.    Historical Provider, MD  Probiotic Product (PROBIOTIC DAILY PO) Take 1 capsule by mouth every morning. Ultra Flora IB    Historical Provider, MD  propranolol (INDERAL) 20 MG tablet TAKE 1 TABLET DAILY 05/05/15   Eulas Post, MD  vitamin B-12 (CYANOCOBALAMIN) 1000 MCG tablet Take 1,000 mcg by mouth every morning.     Historical Provider, MD   BP 167/79 mmHg  Pulse 65  Temp(Src) 98.1 F (36.7 C) (Oral)  Resp 20  SpO2 97% Physical Exam  Constitutional: He is oriented to person, place, and time. Vital signs are normal. He appears well-developed and well-nourished.  Non-toxic appearance. No distress.  Afebrile, nontoxic, NAD  HENT:  Head: Normocephalic and atraumatic.  Mouth/Throat: Oropharynx is clear and moist and mucous membranes are normal.  Eyes: Conjunctivae and EOM are normal. Right eye exhibits no discharge. Left eye exhibits no discharge.  Neck: Normal range of motion. Neck supple. No spinous process tenderness and no muscular tenderness present. No rigidity. Normal range of motion present.  FROM intact without spinous process TTP, no bony stepoffs or deformities, no paraspinous muscle TTP or muscle spasms. No rigidity or meningeal signs. No bruising or swelling.   Cardiovascular: Normal rate, regular rhythm, normal heart sounds and intact distal pulses.  Exam reveals no gallop and no friction rub.   No murmur  heard. Pulmonary/Chest: Effort normal and breath sounds normal. No respiratory distress. He has no decreased breath sounds. He has no wheezes. He has no rhonchi. He has no rales.  Abdominal: Soft. Normal appearance and bowel sounds are normal. He exhibits no distension. There is no tenderness. There is no rigidity, no rebound and no guarding.  Musculoskeletal:       Right shoulder: He exhibits decreased range of motion (due to pain), tenderness, bony tenderness and deformity (AC joint separates visibly during activity with R shoulder). He exhibits no effusion, no crepitus, no laceration, no spasm, normal pulse and normal strength.       Arms: R shoulder with slightly limited ROM due to pain but grossly intact, +bony TTP over [facility] joint, mild trapezius muscular TTP without spasms, no swelling/effusion, no crepitus, no gross deformity but visible AC joint separation during activities, negative apley scratch, +pain with resisted ext  rotation, neg pain with resisted int rotation, +empty can test. Strength and sensation grossly intact in all extremities, distal pulses intact.    Neurological: He is alert and oriented to person, place, and time. He has normal strength. No sensory deficit.  Skin: Skin is warm, dry and intact. No rash noted.  Psychiatric: He has a normal mood and affect.  Nursing note and vitals reviewed.   ED Course  Procedures (including critical care time) Labs Review Labs Reviewed - No data to display  Imaging Review Dg Shoulder Right  08/17/2015  CLINICAL DATA:  Right shoulder pain/ injury playing racquetball EXAM: RIGHT SHOULDER - 2+ VIEW COMPARISON:  None. FINDINGS: No fracture or dislocation is seen. AC joint measures 8 mm, within the upper limits of normal. Normal coracoclavicular distance. Visualized soft tissues are within normal limits. Visualized right lung is clear. IMPRESSION: No fracture or dislocation is seen. Electronically Signed   By: Julian Hy M.D.   On:  08/17/2015 18:28   I have personally reviewed and evaluated these images and lab results as part of my medical decision-making.   EKG Interpretation None      MDM   Final diagnoses:  Right shoulder pain  Separation of right acromioclavicular joint, initial encounter    54 y.o. male here with right shoulder pain and paresthesia of the right thumb after he was playing racquetball and fell to the lateral aspect of the shoulder. Pain with resisted external rotation, pain with empty can test, visible separation of the before meals joint with movements, x-ray obtained which reveals before meals joint measuring 8 mm the upper limits of normal. Suspect AC joint separation. Neurovascularly intact with soft compartments. Will give sling immobilizer to wear until he sees ortho in 1 week, pt has a prior orthopedist he will be seeing. Will give pain meds. Discussed ice. F/up with ortho in 1wk. I explained the diagnosis and have given explicit precautions to return to the ER including for any other new or worsening symptoms. The patient understands and accepts the medical plan as it's been dictated and I have answered their questions. Discharge instructions concerning home care and prescriptions have been given. The patient is STABLE and is discharged to home in good condition.  BP 167/79 mmHg  Pulse 65  Temp(Src) 98.1 F (36.7 C) (Oral)  Resp 20  SpO2 97%  Meds ordered this encounter  Medications  . HYDROcodone-acetaminophen (NORCO/VICODIN) 5-325 MG per tablet 1 tablet    Sig:   . naproxen (NAPROSYN) 500 MG tablet    Sig: Take 1 tablet (500 mg total) by mouth 2 (two) times daily as needed for mild pain, moderate pain or headache (TAKE WITH MEALS.).    Dispense:  20 tablet    Refill:  0    Order Specific Question:  Supervising Provider    Answer:  MILLER, BRIAN [[id number]]  . HYDROcodone-acetaminophen (NORCO) 5-325 MG tablet    Sig: Take 1 tablet by mouth every 6 (six) hours as needed for severe  pain.    Dispense:  10 tablet    Refill:  0    Order Specific Question:  Supervising Provider    Answer:  Noemi Chapel [[id number]]       Karyna Bessler Camprubi-Soms, PA-C 08/17/15 1917  Gareth Morgan, MD 08/18/15 1722

## 2015-10-12 ENCOUNTER — Other Ambulatory Visit: Payer: Self-pay

## 2015-10-12 MED ORDER — CLONAZEPAM 0.5 MG PO TABS: 0.5000 mg | ORAL_TABLET | Freq: Every day | ORAL | Status: DC

## 2015-10-12 MED ORDER — PROPRANOLOL HCL 20 MG PO TABS: 20.0000 mg | ORAL_TABLET | Freq: Every day | ORAL | Status: DC

## 2015-10-12 MED ORDER — CLONAZEPAM 0.5 MG PO TABS
0.5000 mg | ORAL_TABLET | Freq: Every day | ORAL | Status: DC
Start: 2015-10-12 — End: 2015-11-01

## 2015-10-12 NOTE — Telephone Encounter (Signed)
Rx request for propranolol hcl 20 mg tablets.  Rx sent to pharmacy for propranolol.  Pls advise on clonazepam.

## 2015-10-12 NOTE — Telephone Encounter (Signed)
Rx refill request for clonazepam 0.5 mg tablets- Take 1 tablet by mouth at bedtime. #90  Rx last filled on 8.1.2016 #90 with 0 rf. Pt's last appt was 6.24.2016 for a physical exam. Pt's next appt is 6.30.2017.  Pls advise.

## 2015-10-12 NOTE — Telephone Encounter (Signed)
Refill #90 OK

## 2015-10-12 NOTE — Addendum Note (Signed)
Addended by: Colleen Can on: 10/12/2015 04:17 PM   Modules accepted: Orders

## 2015-10-12 NOTE — Telephone Encounter (Signed)
Rx faxed to [facility].

## 2015-11-01 ENCOUNTER — Telehealth: Payer: Self-pay | Admitting: Family Medicine

## 2015-11-01 MED ORDER — CLONAZEPAM 0.5 MG PO TABS: 0.5000 mg | ORAL_TABLET | Freq: Every day | ORAL | Status: DC

## 2015-11-01 NOTE — Telephone Encounter (Signed)
Looks like RX was faxed over on 10/12/2015 #90 to mail order. [facility].

## 2015-11-01 NOTE — Telephone Encounter (Signed)
Wife said [facility] never received the fax and can it be resent    CLONAZEPAM

## 2015-11-01 NOTE — Telephone Encounter (Signed)
Tried calling medication into the mail order pharmacy but controlled substances need to be faxed in. Printed RX for Dr. Elease Hashimoto to sign tomorrow.

## 2015-11-01 NOTE — Telephone Encounter (Addendum)
Patient expressed that his mail order company [facility] needs a new script to refill his clonazePAM

## 2015-11-02 NOTE — Telephone Encounter (Signed)
Wife is aware that RX was sent in for patient.

## 2015-12-12 ENCOUNTER — Other Ambulatory Visit: Payer: Self-pay | Admitting: Family Medicine

## 2016-01-31 ENCOUNTER — Other Ambulatory Visit: Payer: Self-pay | Admitting: Family Medicine

## 2016-01-31 MED ORDER — CLONAZEPAM 0.5 MG PO TABS: 0.5000 mg | ORAL_TABLET | Freq: Every day | ORAL | Status: DC

## 2016-03-20 ENCOUNTER — Other Ambulatory Visit: Payer: Self-pay | Admitting: Family Medicine

## 2016-03-30 ENCOUNTER — Other Ambulatory Visit (INDEPENDENT_AMBULATORY_CARE_PROVIDER_SITE_OTHER): Payer: BLUE CROSS/BLUE SHIELD

## 2016-03-30 DIAGNOSIS — Z0000 Encounter for general adult medical examination without abnormal findings: Secondary | ICD-10-CM

## 2016-03-30 LAB — BASIC METABOLIC PANEL
BUN: 16 mg/dL (ref 6–23)
CHLORIDE: 103 meq/L (ref 96–112)
CO2: 31 mEq/L (ref 19–32)
Calcium: 9.7 mg/dL (ref 8.4–10.5)
Creatinine, Ser: 1.14 mg/dL (ref 0.40–1.50)
GFR: 71.07 mL/min (ref >60.00)
Glucose, Bld: 94 mg/dL (ref 70–99)
POTASSIUM: 4.2 meq/L (ref 3.5–5.1)
Sodium: 139 mEq/L (ref 135–145)

## 2016-03-30 LAB — CBC WITH DIFFERENTIAL/PLATELET
BASOS PCT: 0.6 % (ref 0.0–3.0)
Basophils Absolute: 0 10*3/uL (ref 0.0–0.1)
EOS PCT: 2.3 % (ref 0.0–5.0)
Eosinophils Absolute: 0.1 10*3/uL (ref 0.0–0.7)
HEMATOCRIT: 46.7 % (ref 39.0–52.0)
HEMOGLOBIN: 15.8 g/dL (ref 13.0–17.0)
LYMPHS PCT: 38.3 % (ref 12.0–46.0)
Lymphs Abs: 1.3 10*3/uL (ref 0.7–4.0)
MCHC: 33.9 g/dL (ref 30.0–36.0)
MCV: 91.1 fl (ref 78.0–100.0)
MONOS PCT: 14.7 % — AB (ref 3.0–12.0)
Monocytes Absolute: 0.5 10*3/uL (ref 0.1–1.0)
Neutro Abs: 1.5 10*3/uL (ref 1.4–7.7)
Neutrophils Relative %: 44.1 % (ref 43.0–77.0)
Platelets: 167 10*3/uL (ref 150.0–400.0)
RBC: 5.12 Mil/uL (ref 4.22–5.81)
RDW: 12.9 % (ref 11.5–15.5)
WBC: 3.4 10*3/uL — AB (ref 4.0–10.5)

## 2016-03-30 LAB — LIPID PANEL
CHOL/HDL RATIO: 7
CHOLESTEROL: 216 mg/dL — AB (ref 0–200)
HDL: 32.3 mg/dL — ABNORMAL LOW (ref >39.00)
LDL Cholesterol: 145 mg/dL — ABNORMAL HIGH (ref 0–99)
NonHDL: 183.25
TRIGLYCERIDES: 192 mg/dL — AB (ref 0.0–149.0)
VLDL: 38.4 mg/dL (ref 0.0–40.0)

## 2016-03-30 LAB — HEPATIC FUNCTION PANEL
ALBUMIN: 4.6 g/dL (ref 3.5–5.2)
ALT: 22 U/L (ref 0–53)
AST: 21 U/L (ref 0–37)
Alkaline Phosphatase: 57 U/L (ref 39–117)
Bilirubin, Direct: 0.2 mg/dL (ref 0.0–0.3)
TOTAL PROTEIN: 6.9 g/dL (ref 6.0–8.3)
Total Bilirubin: 1.4 mg/dL — ABNORMAL HIGH (ref 0.2–1.2)

## 2016-03-30 LAB — TSH: TSH: 2.55 u[IU]/mL (ref 0.35–4.50)

## 2016-03-30 LAB — PSA: PSA: 1.07 ng/mL (ref 0.10–4.00)

## 2016-04-06 ENCOUNTER — Ambulatory Visit (INDEPENDENT_AMBULATORY_CARE_PROVIDER_SITE_OTHER): Payer: BLUE CROSS/BLUE SHIELD | Admitting: Family Medicine

## 2016-04-06 ENCOUNTER — Encounter: Payer: Self-pay | Admitting: Family Medicine

## 2016-04-06 VITALS — BP 110/84 | HR 67 | Temp 98.2°F | Ht 68.5 in | Wt 193.0 lb

## 2016-04-06 DIAGNOSIS — Z0000 Encounter for general adult medical examination without abnormal findings: Secondary | ICD-10-CM

## 2016-04-06 DIAGNOSIS — R197 Diarrhea, unspecified: Secondary | ICD-10-CM | POA: Diagnosis not present

## 2016-04-06 MED ORDER — RIFAXIMIN 550 MG PO TABS: 550.0000 mg | ORAL_TABLET | Freq: Three times a day (TID) | ORAL | Status: DC

## 2016-04-06 NOTE — Progress Notes (Signed)
Pre visit review using our clinic review tool, if applicable. No additional management support is needed unless otherwise documented below in the visit note. 

## 2016-04-06 NOTE — Patient Instructions (Signed)
Diet for Irritable Bowel Syndrome When you have irritable bowel syndrome (IBS), the foods you eat and your eating habits are very important. IBS may cause various symptoms, such as abdominal pain, constipation, or diarrhea. Choosing the right foods can help ease discomfort caused by these symptoms. Work with your health care provider and dietitian to find the best eating plan to help control your symptoms. WHAT GENERAL GUIDELINES DO I NEED TO FOLLOW?  Keep a food diary. This will help you identify foods that cause symptoms. Write down:  What you eat and when.  What symptoms you have.  When symptoms occur in relation to your meals.  Avoid foods that cause symptoms. Talk with your dietitian about other ways to get the same nutrients that are in these foods.  Eat more foods that contain fiber. Take a fiber supplement if directed by your dietitian.  Eat your meals slowly, in a relaxed setting.  Aim to eat 5-6 small meals per day. Do not skip meals.  Drink enough fluids to keep your urine clear or pale yellow.  Ask your health care provider if you should take an over-the-counter probiotic during flare-ups to help restore healthy gut bacteria.  If you have cramping or diarrhea, try making your meals low in fat and high in carbohydrates. Examples of carbohydrates are pasta, rice, whole grain breads and cereals, fruits, and vegetables.  If dairy products cause your symptoms to flare up, try eating less of them. You might be able to handle yogurt better than other dairy products because it contains bacteria that help with digestion. WHAT FOODS ARE NOT RECOMMENDED? The following are some foods and drinks that may worsen your symptoms:  Fatty foods, such as French fries.  Milk products, such as cheese or ice cream.  Chocolate.  Alcohol.  Products with caffeine, such as coffee.  Carbonated drinks, such as soda. The items listed above may not be a complete list of foods and beverages to  avoid. Contact your dietitian for more information. WHAT FOODS ARE GOOD SOURCES OF FIBER? Your health care provider or dietitian may recommend that you eat more foods that contain fiber. Fiber can help reduce constipation and other IBS symptoms. Add foods with fiber to your diet a little at a time so that your body can get used to them. Too much fiber at once might cause gas and swelling of your abdomen. The following are some foods that are good sources of fiber:  Apples.  Peaches.  Pears.  Berries.  Figs.  Broccoli (raw).  Cabbage.  Carrots.  Raw peas.  Kidney beans.  Lima beans.  Whole grain bread.  Whole grain cereal. FOR MORE INFORMATION  International Foundation for Functional Gastrointestinal Disorders: www.iffgd.org National Institute of Diabetes and Digestive and Kidney Diseases: www.niddk.nih.gov/health-information/health-topics/digestive-diseases/ibs/Pages/facts.aspx   This information is not intended to replace advice given to you by your health care provider. Make sure you discuss any questions you have with your health care provider.   Document Released: 12/15/2003 Document Revised: 10/15/2014 Document Reviewed: 12/25/2013 Elsevier Interactive Patient Education 2016 Elsevier Inc.  

## 2016-04-06 NOTE — Progress Notes (Signed)
Subjective:    Patient ID: Tim Gordon, male    DOB: 07-Aug-1961, 55 y.o.   MRN: [id number]  HPI  Patient seen for physical exam-and separate issue below He has history of essential tremor, chronic insomnia, and past history of diverticulitis of the colon with previous resection of the sigmoid colon  Colonoscopy up-to-date. Immunizations up-to-date. No risk factors for hepatitis C. Patient is nonsmoker. Gets most of his exercise through yard work.  He is battling with residual symptoms from recent viral URI. Overall improved. Still some postnasal drip symptoms.  Patient has history of frequent loose stools and sometimes even diarrhea. He has had 2 months now of multiple loose stools. He's noted incidentally in the past that when he has taken antibiotics for upper respiratory infection his diarrhea symptoms frequently improved. He is conscious of getting enough fiber. He is not aware of any specific food triggers. No recent appetite or weight changes. No bloody stools. No travels. He avoids lactose. He is concerned he may have diarrhea predominant IBS.  Past Medical History  Diagnosis Date  . Blood in stool   . Allergy   . Hyperlipidemia   . Diverticulitis   . Heart murmur     MVP - NEVER CAUSES ANY PROBLEMS; PT VERY ACTIVE - CAN PLAY RACKETBALL FOR COUPLE OF HOURS AND NEVER ANY CHEST C/O  . GERD (gastroesophageal reflux disease)     CONTROLLED WITH DIET - NO MEDS  . Migraines     OCULAR MIGRAINES  . History of kidney stones   . Sleep apnea     STATES SLEEP STUDY 4 OR 5 YEARS AGO - TOLD BORDERLINE - TRIED CPAP FOR 30 DAYS - STATES IT DID NOT HELP - NO LONGER USES.   Past Surgical History  Procedure Laterality Date  . Appendectomy    . Vasectomy    . Spine surgery  2001    rupture L4-L5  . Nasal reconstruction with septal repair    . Laparoscopic partial colectomy N/A 06/24/2014    Procedure: LAPAROSCOPIC ASSISTED  PARTIAL COLECTOMY;  Surgeon: Jackolyn Confer, MD;  Location:  [facility];  Service: General;  Laterality: N/A;  . Colon surgery      reports that he has never smoked. He has never used smokeless tobacco. He reports that he drinks alcohol. He reports that he does not use illicit drugs. family history includes Arthritis in his mother; Cancer in his maternal grandmother; Cancer (age of onset: 51) in his father; Heart disease in his mother; Hyperlipidemia in his mother; Hypertension in his father and mother; Stroke in his mother and paternal grandfather. Allergies  Allergen Reactions  . Biaxin [Clarithromycin] Other (See Comments)    "went right through me"    Review of Systems  Constitutional: Negative for fever, activity change, appetite change and fatigue.  HENT: Positive for congestion and postnasal drip. Negative for ear pain and trouble swallowing.   Eyes: Negative for pain and visual disturbance.  Respiratory: Negative for cough, shortness of breath and wheezing.   Cardiovascular: Negative for chest pain and palpitations.  Gastrointestinal: Positive for diarrhea. Negative for nausea, vomiting, abdominal pain, constipation, blood in stool, abdominal distention and rectal pain.  Genitourinary: Negative for dysuria, hematuria and testicular pain.  Musculoskeletal: Negative for joint swelling and arthralgias.  Skin: Negative for rash.  Neurological: Negative for dizziness, syncope and headaches.  Hematological: Negative for adenopathy.  Psychiatric/Behavioral: Negative for confusion and dysphoric mood.       Objective:   Physical  Exam  Constitutional: He is oriented to person, place, and time. He appears well-developed and well-nourished. No distress.  HENT:  Head: Normocephalic and atraumatic.  Right Ear: External ear normal.  Left Ear: External ear normal.  Mouth/Throat: Oropharynx is clear and moist.  Eyes: Conjunctivae and EOM are normal. Pupils are equal, round, and reactive to light.  Neck: Normal range of motion. Neck supple. No  thyromegaly present.  Cardiovascular: Normal rate, regular rhythm and normal heart sounds.   No murmur heard. Pulmonary/Chest: No respiratory distress. He has no wheezes. He has no rales.  Abdominal: Soft. Bowel sounds are normal. He exhibits no distension and no mass. There is no tenderness. There is no rebound and no guarding.  Musculoskeletal: He exhibits no edema.  Lymphadenopathy:    He has no cervical adenopathy.  Neurological: He is alert and oriented to person, place, and time. He displays normal reflexes. No cranial nerve deficit.  Skin: No rash noted.  Psychiatric: He has a normal mood and affect.          Assessment & Plan:  #1 physical examination. Labs reviewed. He has dyslipidemia with minimally elevated triglyceride and low HDL. Previous intolerance with niacin. Recommend regular physical activity. Discussed hepatitis C screening and he declines. He has no specific risk factors.  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 His colonoscopy is up-to-date. Symptoms have been progressive over 2 months. Trial of Xifaxan 550 mg 3 times a day for 2 weeks. Touch base if not improving following treatment.  Recommend 25-30 g fiber per day  Eulas Post MD [facility]

## 2016-04-26 ENCOUNTER — Other Ambulatory Visit: Payer: Self-pay | Admitting: Family Medicine

## 2016-05-01 ENCOUNTER — Telehealth: Payer: Self-pay

## 2016-05-01 NOTE — Telephone Encounter (Signed)
Refill with one additional refill. 

## 2016-05-01 NOTE — Telephone Encounter (Signed)
Pt is requesting a refill on Clonazepam 0.5mg  tablets. Last OV was CPE on 04/06/2016  Last medication refill on 01/31/2016 #90 Please advise if okay to refill

## 2016-05-02 MED ORDER — CLONAZEPAM 0.5 MG PO TABS: 0.5000 mg | ORAL_TABLET | Freq: Every day | ORAL | 1 refills | Status: DC

## 2016-05-02 NOTE — Telephone Encounter (Signed)
Printed and Faxed to [employer].

## 2016-05-23 ENCOUNTER — Telehealth: Payer: Self-pay | Admitting: Family Medicine

## 2016-05-23 NOTE — Telephone Encounter (Signed)
Pt is still waiting on PA for omega-3 acid ethyl capsules. Pt wife is requesting [facility] to expedite the PA. Pt still has some medication left

## 2016-05-23 NOTE — Telephone Encounter (Signed)
Tried to re-submit PA, but PA is still pending. The form that was faxed to [place] needs to be filled out & then signed by Dr. Elease Hashimoto and then faxed back to [facility].

## 2016-05-23 NOTE — Telephone Encounter (Signed)
Judson Roch - this is the PA I was discussing with you. Please follow up.

## 2016-05-24 NOTE — Telephone Encounter (Signed)
PA form completed and faxed to [facility] this morning

## 2016-05-28 ENCOUNTER — Telehealth: Payer: Self-pay

## 2016-05-28 NOTE — Telephone Encounter (Signed)
Prior Authorization approval received for omega-3 acid ethyl esters (LOVAZA) 1 g capsule  Approval effective through 05/24/2019  Case [id number]

## 2016-07-10 ENCOUNTER — Telehealth: Payer: Self-pay | Admitting: Family Medicine

## 2016-07-10 MED ORDER — OMEGA-3-ACID ETHYL ESTERS 1 G PO CAPS: 2.0000 | ORAL_CAPSULE | Freq: Two times a day (BID) | ORAL | 1 refills | Status: DC

## 2016-07-10 NOTE — Telephone Encounter (Signed)
Hey can you check on this for me?

## 2016-07-10 NOTE — Telephone Encounter (Signed)
Medication refilled for pt and wife is aware.

## 2016-07-10 NOTE — Telephone Encounter (Signed)
PA is already approved through 05/24/2019. The refill is too soon and cannot be filled until 10/22. Other than that, [facility] doesn't have any further questions.

## 2016-07-10 NOTE — Telephone Encounter (Signed)
Pts wife calling to check on the PA for omega-3 they received a letter from [employer] stating they need information 1 [phone].  Pt is having questions.

## 2016-09-20 ENCOUNTER — Other Ambulatory Visit: Payer: Self-pay | Admitting: Family Medicine

## 2016-09-20 NOTE — Telephone Encounter (Signed)
° ° °  Pt req 90 day supply      Pt request refill of the following:   clonazePAM (KLONOPIN) 0.5 MG tablet   Phamacy:  [facility]

## 2016-09-21 NOTE — Telephone Encounter (Signed)
I think he has upcoming appt.  Refill for 6 months.

## 2016-10-31 ENCOUNTER — Telehealth: Payer: Self-pay

## 2016-10-31 MED ORDER — CLONAZEPAM 0.5 MG PO TABS: 0.5000 mg | ORAL_TABLET | Freq: Every day | ORAL | 1 refills | Status: DC

## 2016-10-31 NOTE — Telephone Encounter (Signed)
[facility] is requesting Clonazepam 0.5mg  tablets Last refill 05-02-2016 #90, 1rf Last OV 04-06-2016 Please advise

## 2016-10-31 NOTE — Telephone Encounter (Signed)
Refill #90 with one refill.  

## 2016-10-31 NOTE — Addendum Note (Signed)
Addended by: Elio Forget on: 10/31/2016 10:39 AM   Modules accepted: Orders

## 2016-10-31 NOTE — Telephone Encounter (Signed)
Faxed to pharmacy

## 2016-11-21 ENCOUNTER — Ambulatory Visit (INDEPENDENT_AMBULATORY_CARE_PROVIDER_SITE_OTHER): Payer: BLUE CROSS/BLUE SHIELD | Admitting: Family Medicine

## 2016-11-21 VITALS — BP 138/98 | HR 61 | Temp 97.9°F | Ht 68.5 in | Wt 197.0 lb

## 2016-11-21 DIAGNOSIS — R059 Cough, unspecified: Secondary | ICD-10-CM

## 2016-11-21 DIAGNOSIS — R05 Cough: Secondary | ICD-10-CM

## 2016-11-21 DIAGNOSIS — R030 Elevated blood-pressure reading, without diagnosis of hypertension: Secondary | ICD-10-CM

## 2016-11-21 DIAGNOSIS — E785 Hyperlipidemia, unspecified: Secondary | ICD-10-CM | POA: Diagnosis not present

## 2016-11-21 DIAGNOSIS — M25522 Pain in left elbow: Secondary | ICD-10-CM | POA: Diagnosis not present

## 2016-11-21 LAB — LIPID PANEL
CHOLESTEROL: 206 mg/dL — AB (ref 0–200)
HDL: 32.7 mg/dL — ABNORMAL LOW (ref >39.00)
NonHDL: 172.88
TRIGLYCERIDES: 212 mg/dL — AB (ref 0.0–149.0)
Total CHOL/HDL Ratio: 6
VLDL: 42.4 mg/dL — ABNORMAL HIGH (ref 0.0–40.0)

## 2016-11-21 LAB — LDL CHOLESTEROL, DIRECT: Direct LDL: 127 mg/dL

## 2016-11-21 NOTE — Progress Notes (Signed)
Pre visit review using our clinic review tool, if applicable. No additional management support is needed unless otherwise documented below in the visit note. 

## 2016-11-21 NOTE — Progress Notes (Signed)
Subjective:     Patient ID: Tim Gordon, male   DOB: 11/24/1960, 56 y.o.   MRN: [id number]  HPI Patient seen for the following issues:  Cough for 2 weeks. He states he had cough about 2 months ago that was following a URI and eventually resolved. Current episode started 2 weeks ago. Was recently on business traveling in [place]. He had viral URI type symptoms. Cough is productive early morning. No hemoptysis. No appetite or weight changes. No fevers or chills. No dyspnea. No wheezing. Never smoked. Possible postnasal drip.  Occasional GERD symptoms and recently started Prilosec.  Second issue is dyslipidemia. History of low HDL and high triglycerides and mild hyperlipidemia with high cholesterol. He has taken fish oil in the past but recently stopped and is requesting fasting lipids this morning. No history of diabetes  Left elbow pain. He states he fell racquetball playing over year ago said some mild pain since then. No visible swelling. No restricted range of motion.  Past Medical History:  Diagnosis Date  . Allergy   . Blood in stool   . Diverticulitis   . GERD (gastroesophageal reflux disease)    CONTROLLED WITH DIET - NO MEDS  . Heart murmur    MVP - NEVER CAUSES ANY PROBLEMS; PT VERY ACTIVE - CAN PLAY RACKETBALL FOR COUPLE OF HOURS AND NEVER ANY CHEST C/O  . History of kidney stones   . Hyperlipidemia   . Migraines    OCULAR MIGRAINES  . Sleep apnea    STATES SLEEP STUDY 4 OR 5 YEARS AGO - TOLD BORDERLINE - TRIED CPAP FOR 30 DAYS - STATES IT DID NOT HELP - NO LONGER USES.   Past Surgical History:  Procedure Laterality Date  . APPENDECTOMY    . COLON SURGERY    . LAPAROSCOPIC PARTIAL COLECTOMY N/A 06/24/2014   Procedure: LAPAROSCOPIC ASSISTED  PARTIAL COLECTOMY;  Surgeon: Jackolyn Confer, MD;  Location: [facility];  Service: General;  Laterality: N/A;  . NASAL RECONSTRUCTION WITH SEPTAL REPAIR    . SPINE SURGERY  2001   rupture L4-L5  . VASECTOMY      reports that he has never  smoked. He has never used smokeless tobacco. He reports that he drinks alcohol. He reports that he does not use drugs. family history includes Arthritis in his mother; Cancer in his maternal grandmother; Cancer (age of onset: 72) in his father; Heart disease in his mother; Hyperlipidemia in his mother; Hypertension in his father and mother; Stroke in his mother and paternal grandfather. Allergies  Allergen Reactions  . Biaxin [Clarithromycin] Other (See Comments)    "went right through me"     Review of Systems  Constitutional: Negative for activity change, appetite change, chills, fatigue, fever and unexpected weight change.  HENT: Negative for congestion, ear pain, sore throat and trouble swallowing.   Respiratory: Positive for cough. Negative for shortness of breath, wheezing and stridor.   Cardiovascular: Negative for chest pain and leg swelling.  Gastrointestinal: Negative for abdominal pain.  Musculoskeletal: Negative for arthralgias.  Skin: Negative for rash.  Neurological: Negative for syncope and headaches.  Hematological: Negative for adenopathy.       Objective:   Physical Exam  Constitutional: He is oriented to person, place, and time. He appears well-developed and well-nourished. No distress.  HENT:  Head: Normocephalic and atraumatic.  Eyes: Pupils are equal, round, and reactive to light.  Neck: Normal range of motion. Neck supple. No thyromegaly present.  Cardiovascular: Normal rate and regular  rhythm.   No murmur heard. Pulmonary/Chest: Effort normal. No stridor. No respiratory distress. He has no wheezes. He has no rales.  Abdominal: Soft. Bowel sounds are normal. There is no tenderness.  Musculoskeletal: He exhibits no edema.  Left elbow reveals full range of motion. He has minimal tenderness over the olecranon region but no bursal swelling. No erythema. No warmth. No ecchymosis.  Lymphadenopathy:    He has no cervical adenopathy.  Neurological: He is alert and  oriented to person, place, and time.  Psychiatric: He has a normal mood and affect.       Assessment:     #1 cough suspect related to viral bronchitis. Nonfocal exam. Treat symptomatically  #2 elevated blood pressure. Repeat after rest improved  #3 dyslipidemia  #4 left elbow pain for the past year. May have small avulsion from remote injury    Plan:     -continue monitor blood pressure -Over-the-counter plain Mucinex for cough and follow-up for cough or fever -Check fasting lipid panel -We discussed pros and cons of x-ray left elbow but he is not having any significant dysfunction and we elected against this point  Eulas Post MD [facility] at [facility]

## 2016-11-21 NOTE — Patient Instructions (Signed)

## 2016-11-22 ENCOUNTER — Encounter: Payer: Self-pay | Admitting: Family Medicine

## 2017-03-28 ENCOUNTER — Telehealth: Payer: Self-pay | Admitting: Family Medicine

## 2017-03-28 NOTE — Telephone Encounter (Signed)
Pt need new Rx for clonazepam #90 and propanolol #90  Pharm:  [facility]

## 2017-03-29 MED ORDER — CLONAZEPAM 0.5 MG PO TABS: 0.5000 mg | ORAL_TABLET | Freq: Every day | ORAL | 1 refills | Status: DC

## 2017-03-29 NOTE — Telephone Encounter (Signed)
Rx called in 

## 2017-03-29 NOTE — Telephone Encounter (Signed)
Okay to fill Clonazepam?  Last refill 10/31/16 and last office visit 11/21/16.

## 2017-03-29 NOTE — Telephone Encounter (Signed)
Refill OK

## 2017-04-15 ENCOUNTER — Other Ambulatory Visit: Payer: BLUE CROSS/BLUE SHIELD

## 2017-04-17 ENCOUNTER — Other Ambulatory Visit: Payer: Self-pay | Admitting: Family Medicine

## 2017-04-17 MED ORDER — CLONAZEPAM 0.5 MG PO TABS: 0.5000 mg | ORAL_TABLET | Freq: Every day | ORAL | 1 refills | Status: DC

## 2017-04-17 NOTE — Telephone Encounter (Signed)
Received a refill request from [facility] for refill of clonazepam.

## 2017-04-17 NOTE — Telephone Encounter (Signed)
Rx faxed and confirmed to [facility]

## 2017-04-17 NOTE — Telephone Encounter (Signed)
Spoke with patient and he would like the [facility].  rx cancelled at [facility].

## 2017-04-19 ENCOUNTER — Encounter: Payer: BLUE CROSS/BLUE SHIELD | Admitting: Family Medicine

## 2017-05-03 ENCOUNTER — Ambulatory Visit (INDEPENDENT_AMBULATORY_CARE_PROVIDER_SITE_OTHER): Payer: BLUE CROSS/BLUE SHIELD | Admitting: Family Medicine

## 2017-05-03 ENCOUNTER — Encounter: Payer: Self-pay | Admitting: Family Medicine

## 2017-05-03 VITALS — BP 140/90 | HR 66 | Temp 97.8°F | Ht 69.5 in | Wt 190.6 lb

## 2017-05-03 DIAGNOSIS — Z0000 Encounter for general adult medical examination without abnormal findings: Secondary | ICD-10-CM

## 2017-05-03 DIAGNOSIS — Z125 Encounter for screening for malignant neoplasm of prostate: Secondary | ICD-10-CM | POA: Diagnosis not present

## 2017-05-03 LAB — CBC WITH DIFFERENTIAL/PLATELET
BASOS PCT: 0.3 % (ref 0.0–3.0)
Basophils Absolute: 0 10*3/uL (ref 0.0–0.1)
EOS ABS: 0.1 10*3/uL (ref 0.0–0.7)
Eosinophils Relative: 1.2 % (ref 0.0–5.0)
HCT: 50.5 % (ref 39.0–52.0)
HEMOGLOBIN: 17 g/dL (ref 13.0–17.0)
LYMPHS ABS: 1.3 10*3/uL (ref 0.7–4.0)
Lymphocytes Relative: 29.2 % (ref 12.0–46.0)
MCHC: 33.8 g/dL (ref 30.0–36.0)
MCV: 94.7 fl (ref 78.0–100.0)
MONO ABS: 0.4 10*3/uL (ref 0.1–1.0)
Monocytes Relative: 9.6 % (ref 3.0–12.0)
NEUTROS ABS: 2.6 10*3/uL (ref 1.4–7.7)
Neutrophils Relative %: 59.7 % (ref 43.0–77.0)
Platelets: 182 10*3/uL (ref 150.0–400.0)
RBC: 5.33 Mil/uL (ref 4.22–5.81)
RDW: 13.7 % (ref 11.5–15.5)
WBC: 4.4 10*3/uL (ref 4.0–10.5)

## 2017-05-03 LAB — TSH: TSH: 1.79 u[IU]/mL (ref 0.35–4.50)

## 2017-05-03 LAB — HEPATIC FUNCTION PANEL
ALT: 16 U/L (ref 0–53)
AST: 17 U/L (ref 0–37)
Albumin: 4.9 g/dL (ref 3.5–5.2)
Alkaline Phosphatase: 51 U/L (ref 39–117)
BILIRUBIN DIRECT: 0.3 mg/dL (ref 0.0–0.3)
BILIRUBIN TOTAL: 2.3 mg/dL — AB (ref 0.2–1.2)
Total Protein: 7.3 g/dL (ref 6.0–8.3)

## 2017-05-03 LAB — LIPID PANEL
CHOLESTEROL: 245 mg/dL — AB (ref 0–200)
HDL: 36.6 mg/dL — ABNORMAL LOW (ref >39.00)
LDL CALC: 175 mg/dL — AB (ref 0–99)
NonHDL: 208.54
Total CHOL/HDL Ratio: 7
Triglycerides: 170 mg/dL — ABNORMAL HIGH (ref 0.0–149.0)
VLDL: 34 mg/dL (ref 0.0–40.0)

## 2017-05-03 LAB — BASIC METABOLIC PANEL
BUN: 18 mg/dL (ref 6–23)
CHLORIDE: 102 meq/L (ref 96–112)
CO2: 30 mEq/L (ref 19–32)
Calcium: 9.8 mg/dL (ref 8.4–10.5)
Creatinine, Ser: 1.14 mg/dL (ref 0.40–1.50)
GFR: 70.78 mL/min (ref >60.00)
Glucose, Bld: 95 mg/dL (ref 70–99)
POTASSIUM: 4.6 meq/L (ref 3.5–5.1)
Sodium: 139 mEq/L (ref 135–145)

## 2017-05-03 LAB — PSA: PSA: 1.61 ng/mL (ref 0.10–4.00)

## 2017-05-03 NOTE — Patient Instructions (Signed)
Monitor blood pressure and be in touch if consistently > 140/90.   Consider Shingrix (shingles) vaccine.

## 2017-05-03 NOTE — Progress Notes (Signed)
Subjective:     Patient ID: Tim Gordon, male   DOB: Sep 30, 1961, 56 y.o.   MRN: [id number]  HPI Patient seen for physical. He's had history of complicated diverticulitis with previous partial colectomy, dyslipidemia, essential tremor, chronic insomnia. Colonoscopy up-to-date. His donated blood in the past year or so has been screened for hepatitis C and HIV. No history of shingles vaccine.  Nonsmoker. Usually exercises several days per week.  Past Medical History:  Diagnosis Date  . Allergy   . Blood in stool   . Diverticulitis   . GERD (gastroesophageal reflux disease)    CONTROLLED WITH DIET - NO MEDS  . Heart murmur    MVP - NEVER CAUSES ANY PROBLEMS; PT VERY ACTIVE - CAN PLAY RACKETBALL FOR COUPLE OF HOURS AND NEVER ANY CHEST C/O  . History of kidney stones   . Hyperlipidemia   . Migraines    OCULAR MIGRAINES  . Sleep apnea    STATES SLEEP STUDY 4 OR 5 YEARS AGO - TOLD BORDERLINE - TRIED CPAP FOR 30 DAYS - STATES IT DID NOT HELP - NO LONGER USES.   Past Surgical History:  Procedure Laterality Date  . APPENDECTOMY    . COLON SURGERY    . LAPAROSCOPIC PARTIAL COLECTOMY N/A 06/24/2014   Procedure: LAPAROSCOPIC ASSISTED  PARTIAL COLECTOMY;  Surgeon: Jackolyn Confer, MD;  Location: [facility];  Service: General;  Laterality: N/A;  . NASAL RECONSTRUCTION WITH SEPTAL REPAIR    . SPINE SURGERY  2001   rupture L4-L5  . VASECTOMY      reports that he has never smoked. He has never used smokeless tobacco. He reports that he drinks alcohol. He reports that he does not use drugs. family history includes Arthritis in his mother; Cancer in his maternal grandmother; Cancer (age of onset: 25) in his father; Heart disease in his mother; Hyperlipidemia in his mother; Hypertension in his father and mother; Stroke in his mother and paternal grandfather. Allergies  Allergen Reactions  . Biaxin [Clarithromycin] Other (See Comments)    "went right through me"     Review of Systems  Constitutional:  Negative for activity change, appetite change, fatigue and fever.  HENT: Negative for congestion, ear pain and trouble swallowing.   Eyes: Negative for pain and visual disturbance.  Respiratory: Negative for cough, shortness of breath and wheezing.   Cardiovascular: Negative for chest pain and palpitations.  Gastrointestinal: Negative for abdominal distention, abdominal pain, blood in stool, constipation, diarrhea, nausea, rectal pain and vomiting.  Genitourinary: Negative for dysuria, hematuria and testicular pain.  Musculoskeletal: Negative for arthralgias and joint swelling.  Skin: Negative for rash.  Neurological: Positive for tremors. Negative for dizziness, syncope and headaches.  Hematological: Negative for adenopathy.  Psychiatric/Behavioral: Negative for confusion and dysphoric mood.       Objective:   Physical Exam  Constitutional: He is oriented to person, place, and time. He appears well-developed and well-nourished. No distress.  HENT:  Head: Normocephalic and atraumatic.  Right Ear: External ear normal.  Left Ear: External ear normal.  Mouth/Throat: Oropharynx is clear and moist.  Eyes: Pupils are equal, round, and reactive to light. Conjunctivae and EOM are normal.  Neck: Normal range of motion. Neck supple. No thyromegaly present.  Cardiovascular: Normal rate, regular rhythm and normal heart sounds.   No murmur heard. Pulmonary/Chest: No respiratory distress. He has no wheezes. He has no rales.  Abdominal: Soft. Bowel sounds are normal. He exhibits no distension and no mass. There is no  tenderness. There is no rebound and no guarding.  Musculoskeletal: He exhibits no edema.  Lymphadenopathy:    He has no cervical adenopathy.  Neurological: He is alert and oriented to person, place, and time. He displays normal reflexes. No cranial nerve deficit.  Skin: No rash noted.  Psychiatric: He has a normal mood and affect.       Assessment:     Physical exam. No history  of shingles vaccine.  Has had previous screening for hepatitis C.  Blood pressure slightly elevated today    Plan:     -Obtain screening lab work -The natural history of prostate cancer and ongoing controversy regarding screening and potential treatment outcomes of prostate cancer has been discussed with the patient. The meaning of a false positive PSA and a false negative PSA has been discussed. He indicates understanding of the limitations of this screening test and wishes to proceed with screening PSA testing. -Discussed new shingles vaccine and he will check with insurance and let us know -Monitor blood pressure and be in touch if home readings consistently  > 140/90  Eulas Post MD [facility]

## 2017-05-20 ENCOUNTER — Other Ambulatory Visit: Payer: Self-pay | Admitting: Family Medicine

## 2017-05-20 DIAGNOSIS — E785 Hyperlipidemia, unspecified: Secondary | ICD-10-CM

## 2017-06-26 ENCOUNTER — Ambulatory Visit (INDEPENDENT_AMBULATORY_CARE_PROVIDER_SITE_OTHER): Payer: BLUE CROSS/BLUE SHIELD | Admitting: Neurology

## 2017-06-26 ENCOUNTER — Encounter: Payer: Self-pay | Admitting: Neurology

## 2017-06-26 VITALS — BP 165/105 | HR 67 | Ht 69.0 in | Wt 192.5 lb

## 2017-06-26 DIAGNOSIS — F458 Other somatoform disorders: Secondary | ICD-10-CM | POA: Diagnosis not present

## 2017-06-26 DIAGNOSIS — G4733 Obstructive sleep apnea (adult) (pediatric): Secondary | ICD-10-CM | POA: Diagnosis not present

## 2017-06-26 DIAGNOSIS — R51 Headache: Secondary | ICD-10-CM | POA: Diagnosis not present

## 2017-06-26 DIAGNOSIS — R351 Nocturia: Secondary | ICD-10-CM

## 2017-06-26 DIAGNOSIS — G2581 Restless legs syndrome: Secondary | ICD-10-CM | POA: Diagnosis not present

## 2017-06-26 DIAGNOSIS — E663 Overweight: Secondary | ICD-10-CM | POA: Diagnosis not present

## 2017-06-26 DIAGNOSIS — G479 Sleep disorder, unspecified: Secondary | ICD-10-CM | POA: Diagnosis not present

## 2017-06-26 DIAGNOSIS — R519 Headache, unspecified: Secondary | ICD-10-CM

## 2017-06-26 NOTE — Progress Notes (Signed)
Subjective:    Patient ID: Tim Gordon is a 56 y.o. male.  HPI     [name], MD, PhD [facility] [address] [place], [place] [address]  I saw Mr. Tim Gordon, as a self referral for his sleep issues. He is unaccompanied today. Mr. Tim Gordon is a 56 year old right-handed gentleman with an underlying medical history of allergies, diverticulitis with status post partial colectomy in 2015, ET, hyperlipidemia, reflux disease, mitral valve prolapse, history of kidney stones, history of ocular migraines, and overweight state, who was previously diagnosed several years ago while in [place], with mild to borderline obstructive sleep apnea for which he tried CPAP. He did not think it helped and could not sleep with it. He stopped it after a month. He reports recurrent headaches for the past few months. He has also noticed tooth grinding and has started using a mouth guard which has helped a little bit. He has a history of snoring and has woken up in the middle of the night with headaches. His Epworth sleepiness score is 4 out of 24, fatigue score is 22 out of 63. He is married and lives with his wife and 59 year old daughter. He has 4 children, 4 yo son, twins (son and daughter) and 51 yo adoptive daughter. He is a nonsmoker and drinks alcohol occasionally, 4-6 times per month, does not use illicit drugs and does not drink caffeine on a day-to-day basis. He had L spine surgery 2002. He does not have residual pain. He still has migraines, mostly visual auras. His triggers are sleep deprivation and dehydration, changing time zones. He travels about once a month for his work. He has a lot of overseas travels. He does not sleep well. He has multiple nighttime awakenings. Nocturia is about 2-3 times per average night. His snoring is reportedly loud at times and disturbing to his wife. He has restless leg symptoms. He also has bilateral shoulder pain, left more than right. He  has been on clonazepam at night for sleep for some time, several years. He used to be on 0.25 mg at night, currently 0.5 mg at night. It also seems to help his restless leg symptoms. His oldest son had restless leg symptoms and also took clonazepam for some time for it.  His Past Medical History Is Significant For: Past Medical History:  Diagnosis Date  . Allergy   . Blood in stool   . Diverticulitis   . GERD (gastroesophageal reflux disease)    CONTROLLED WITH DIET - NO MEDS  . Heart murmur    MVP - NEVER CAUSES ANY PROBLEMS; PT VERY ACTIVE - CAN PLAY RACKETBALL FOR COUPLE OF HOURS AND NEVER ANY CHEST C/O  . History of kidney stones   . Hyperlipidemia   . Migraines    OCULAR MIGRAINES  . Sleep apnea    STATES SLEEP STUDY 4 OR 5 YEARS AGO - TOLD BORDERLINE - TRIED CPAP FOR 30 DAYS - STATES IT DID NOT HELP - NO LONGER USES.    His Past Surgical History Is Significant For: Past Surgical History:  Procedure Laterality Date  . APPENDECTOMY    . COLON SURGERY    . LAPAROSCOPIC PARTIAL COLECTOMY N/A 06/24/2014   Procedure: LAPAROSCOPIC ASSISTED  PARTIAL COLECTOMY;  Surgeon: Jackolyn Confer, MD;  Location: [facility];  Service: General;  Laterality: N/A;  . NASAL RECONSTRUCTION WITH SEPTAL REPAIR    . SPINE SURGERY  2001   rupture L4-L5  . VASECTOMY  His Family History Is Significant For: Family History  Problem Relation Age of Onset  . Arthritis Mother   . Hyperlipidemia Mother   . Stroke Mother   . Hypertension Mother   . Heart disease Mother        valvular heart disease ? which valve.  . Cancer Father 42       prostate  . Hypertension Father   . Cancer Maternal Grandmother        ovarian  . Stroke Paternal Grandfather     His Social History Is Significant For: Social History   Social History  . Marital status: Married    Spouse name: N/A  . Number of children: N/A  . Years of education: N/A   Social History Main Topics  . Smoking status: Never Smoker  .  Smokeless tobacco: Never Used  . Alcohol use Yes     Comment: RARE ALCOHOL  . Drug use: No  . Sexual activity: Not Asked   Other Topics Concern  . None   Social History Narrative  . None    His Allergies Are:  Allergies  Allergen Reactions  . Biaxin [Clarithromycin] Other (See Comments)    "went right through me"  :   His Current Medications Are:  Outpatient Encounter Prescriptions as of 06/26/2017  Medication Sig  . cetirizine (KLS ALLER-TEC) 10 MG tablet Take 10 mg by mouth at bedtime.   . clonazePAM (KLONOPIN) 0.5 MG tablet Take 1 tablet (0.5 mg total) by mouth at bedtime.  . propranolol (INDERAL) 20 MG tablet TAKE 1 TABLET DAILY  . ranitidine (ZANTAC) 150 MG capsule Take 150 mg by mouth 2 (two) times daily.  . vitamin B-12 (CYANOCOBALAMIN) 1000 MCG tablet Take 5,000 mcg by mouth every morning.   . [DISCONTINUED] Probiotic Product (PROBIOTIC DAILY PO) Take 1 capsule by mouth every morning. Ultra Flora IB   No facility-administered encounter medications on file as of 06/26/2017.   :  Review of Systems:  Out of a complete 14 point review of systems, all are reviewed and negative with the exception of these symptoms as listed below: Review of Systems  Neurological:       Pt says that starting about mid summer, he noticed that he was clenching his teeth during the night and waking up with headaches. He purchased a mouth guard and noticed some improvement. He is scheduled to see his dentist this Friday for a routine cleaning.   Epworth Sleepiness Scale 0= would never doze 1= slight chance of dozing 2= moderate chance of dozing 3= high chance of dozing  Sitting and reading:2 Watching TV:1 Sitting inactive in a public place (ex. Theater or meeting):0 As a passenger in a car for an hour without a break:0 Lying down to rest in the afternoon:1 Sitting and talking to someone:0 Sitting quietly after lunch (no alcohol):0 In a car, while stopped in traffic:0 Total: 4      Objective:  Neurological Exam  Physical Exam Physical Examination:   Vitals:   06/26/17 1513 06/26/17 1515  BP: (!) 174/111 (!) 165/105  Pulse: 65 67   General Examination: The patient is a very pleasant 56 y.o. male in no acute distress. He appears well-developed and well-nourished and well groomed.   HEENT: Normocephalic, atraumatic, pupils are equal, round and reactive to light and accommodation. Corrective eye glasses.  Extraocular tracking is good without limitation to gaze excursion or nystagmus noted. Normal smooth pursuit is noted. Hearing is grossly intact. Face is symmetric with  normal facial animation and normal facial sensation. Speech is clear with no dysarthria noted. There is no hypophonia. There is no lip, neck/head, jaw or voice tremor. Neck is supple with full range of passive and active motion. There are no carotid bruits on auscultation. Oropharynx exam reveals: mild mouth dryness, good dental hygiene and moderate airway crowding, due to smaller airway entry. Mallampati is class II. Tongue protrudes centrally and palate elevates symmetrically. Tonsils are 1+. Neck size is 16 7/8 inches. He has a Mild overbite.   Chest: Clear to auscultation without wheezing, rhonchi or crackles noted.  Heart: S1+S2+0, regular and normal without murmurs, rubs or gallops noted.   Abdomen: Soft, non-tender and non-distended with normal bowel sounds appreciated on auscultation.  Extremities: There is no pitting edema in the distal lower extremities bilaterally. Pedal pulses are intact.  Skin: Warm and dry without trophic changes noted.  Musculoskeletal: exam reveals no obvious joint deformities, tenderness or joint swelling or erythema.   Neurologically:  Mental status: The patient is awake, alert and oriented in all 4 spheres. His immediate and remote memory, attention, language skills and fund of knowledge are appropriate. There is no evidence of aphasia, agnosia, apraxia or  anomia. Speech is clear with normal prosody and enunciation. Thought process is linear. Mood is normal and affect is normal.  Cranial nerves II - XII are as described above under HEENT exam. In addition: shoulder shrug is normal with equal shoulder height noted. Motor exam: Normal bulk, strength and tone is noted. There is no drift. No resting tremor. He has a very mild bilateral upper extremity postural and action tremor. Romberg is negative. Reflexes are 1+ throughout. Fine motor skills and coordination: intact with normal finger taps, normal hand movements, normal rapid alternating patting, normal foot taps and normal foot agility.  Cerebellar testing: No dysmetria or intention tremor on finger to nose testing. Heel to shin is unremarkable bilaterally. There is no truncal or gait ataxia.  Sensory exam: intact to light touch in the upper and lower extremities.  Gait, station and balance: He stands easily. No veering to one side is noted. No leaning to one side is noted. Posture is age-appropriate and stance is narrow based. Gait shows normal stride length and normal pace. No problems turning are noted. Tandem walk is unremarkable.   Assessment and Plan:   In summary, JERMAL DISMUKE is a very pleasant 56 y.o.-year old male with an underlying medical history of allergies, diverticulitis with status post partial colectomy in 2015, ET, hyperlipidemia, reflux disease, mitral valve prolapse, history of kidney stones, history of ocular migraines, and overweight state, who presents for sleep consultation with a prior diagnosis of OSA. He also endorses restless leg syndrome type symptoms, restless sleep, nocturia, morning headaches and nocturnal headaches.  I had a long chat with the patient about my findings and the diagnosis of OSA, its prognosis and treatment options. We talked about medical treatments, surgical interventions and non-pharmacological approaches. I explained in particular the risks and  ramifications of untreated moderate to severe OSA, especially with respect to developing cardiovascular disease down the Road, including congestive heart failure, difficult to treat hypertension, cardiac arrhythmias, or stroke. Even type 2 diabetes has, in part, been linked to untreated OSA. Symptoms of untreated OSA include daytime sleepiness, memory problems, mood irritability and mood disorder such as depression and anxiety, lack of energy, as well as recurrent headaches, especially morning headaches. We talked about trying to maintain a healthy lifestyle in general, as well  as the importance of weight control. I encouraged the patient to eat healthy, exercise daily and keep well hydrated, to keep a scheduled bedtime and wake time routine, to not skip any meals and eat healthy snacks in between meals. I advised the patient not to drive when feeling sleepy. I recommended the following at this time: sleep study with potential positive airway pressure titration. (We will score hypopneas at 3%).   I explained the sleep test procedure to the patient and also outlined possible surgical and non-surgical treatment options of OSA, including the use of a custom-made dental device (which would require a referral to a specialist dentist or oral surgeon), upper airway surgical options, such as pillar implants, radiofrequency surgery, tongue base surgery, and UPPP (which would involve a referral to an ENT surgeon). Rarely, jaw surgery such as mandibular advancement may be considered.  I also explained the CPAP treatment option to the patient, who indicated that he would be willing to try CPAP if the need arises. I explained the importance of being compliant with PAP treatment, not only for insurance purposes but primarily to improve His symptoms, and for the patient's long term health benefit, including to reduce His cardiovascular risks. I answered all his questions today and the patient was in agreement. I would like to  see him back after the sleep study is completed and encouraged him to call with any interim questions, concerns, problems or updates.   Thank you very much for allowing me to participate in the care of this nice patient. If I can be of any further assistance to you please do not hesitate to call me at [phone].  Sincerely,   [name], MD, PhD

## 2017-06-26 NOTE — Patient Instructions (Signed)

## 2017-06-27 ENCOUNTER — Encounter: Payer: Self-pay | Admitting: Family Medicine

## 2017-08-05 ENCOUNTER — Ambulatory Visit (INDEPENDENT_AMBULATORY_CARE_PROVIDER_SITE_OTHER): Payer: BLUE CROSS/BLUE SHIELD | Admitting: Neurology

## 2017-08-05 DIAGNOSIS — F458 Other somatoform disorders: Secondary | ICD-10-CM

## 2017-08-05 DIAGNOSIS — G4733 Obstructive sleep apnea (adult) (pediatric): Secondary | ICD-10-CM | POA: Diagnosis not present

## 2017-08-05 DIAGNOSIS — E663 Overweight: Secondary | ICD-10-CM

## 2017-08-05 DIAGNOSIS — G479 Sleep disorder, unspecified: Secondary | ICD-10-CM

## 2017-08-05 DIAGNOSIS — R51 Headache: Secondary | ICD-10-CM

## 2017-08-05 DIAGNOSIS — G2581 Restless legs syndrome: Secondary | ICD-10-CM

## 2017-08-05 DIAGNOSIS — R351 Nocturia: Secondary | ICD-10-CM

## 2017-08-05 DIAGNOSIS — R519 Headache, unspecified: Secondary | ICD-10-CM

## 2017-08-07 ENCOUNTER — Telehealth: Payer: Self-pay | Admitting: Neurology

## 2017-08-07 NOTE — Procedures (Signed)
PATIENT'S NAME:  Tim Gordon, Tim Gordon DOB:      Jul 16, 1961      MR#:    [id number]     DATE OF RECORDING: 08/05/2017 REFERRING M.D.:  Carolann Littler, MD Study Performed:   Baseline Polysomnogram HISTORY: 56 year old man with a history of allergies, diverticulitis with status post partial colectomy in 2015, ET, hyperlipidemia, reflux disease, mitral valve prolapse, history of kidney stones, history of ocular migraines, and overweight state, who was previously diagnosed with obstructive sleep apnea for which he tried CPAP. He presents for re-evaluation, due to report of snoring and jaw clenching, as well as recurrent headaches. The patient endorsed the Epworth Sleepiness Scale at 4 points. The patient's weight 192 pounds with a height of 69 (inches), resulting in a BMI of 28.4 kg/m2. The patient's neck circumference measured 17 inches.  CURRENT MEDICATIONS: Cetirizine, Klonopin, Inderal, Zantac, Vitamin B12   PROCEDURE:  This is a multichannel digital polysomnogram utilizing the Somnostar 11.2 system.  Electrodes and sensors were applied and monitored per AASM Specifications.   EEG, EOG, Chin and Limb EMG, were sampled at 200 Hz.  ECG, Snore and Nasal Pressure, Thermal Airflow, Respiratory Effort, CPAP Flow and Pressure, Oximetry was sampled at 50 Hz. Digital video and audio were recorded.      BASELINE STUDY  Lights Out was at 22:35 and Lights On at 05:14.  Total recording time (TRT) was 400 minutes, with a total sleep time (TST) of  393 minutes.  The patient's sleep latency was 7 minutes.  REM latency was 77 minutes, which is normal. The sleep efficiency was 98.3 %.     SLEEP ARCHITECTURE: WASO (Wake after sleep onset) was 0 minutes.  There were 2.5 minutes in Stage N1, 199 minutes Stage N2, 69 minutes Stage N3 and 122.5 minutes in Stage REM.  The percentage of Stage N1 was .6%, Stage N2 was 50.6%, which is normal, Stage N3 was 17.6% and Stage R (REM sleep) was 31.2%, which is increased. The arousals were  noted as: 45 were spontaneous, 1 were associated with PLMs, 40 were associated with respiratory events. Audio and video analysis did not show any abnormal or unusual movements, behaviors, phonations or vocalizations. The patient took bathroom breaks. Mild to moderate snoring was noted. The EKG was in keeping with normal sinus rhythm (NSR).  RESPIRATORY ANALYSIS:  There were a total of 40 respiratory events:  2 obstructive apneas, 0 central apneas and 0 mixed apneas with a total of 2 apneas and an apnea index (AI) of .3 /hour. There were 38 hypopneas with a hypopnea index of 5.8 /hour. The patient also had 0 respiratory event related arousals (RERAs).      The total APNEA/HYPOPNEA INDEX (AHI) was 6.1/hour and the total RESPIRATORY DISTURBANCE INDEX was 6.1 /hour.  16 events occurred in REM sleep and 48 events in NREM. The REM AHI was 7.8 /hour, versus a non-REM AHI of 5.3. The patient spent 393 minutes of total sleep time in the supine position and 0 minutes in non-supine.. The supine AHI was 6.1 versus a non-supine AHI of 0.0.  OXYGEN SATURATION & C02:  The Wake baseline 02 saturation was 94%, with the lowest being 87%. Time spent below 89% saturation equaled 2 minutes.  PERIODIC LIMB MOVEMENTS: The patient had a total of 5 Periodic Limb Movements. The Periodic Limb Movement (PLM) index was .8 and the PLM Arousal index was .2/hour.  Post-study, the patient indicated that sleep was better than usual.   IMPRESSION:  1.  Obstructive Sleep Apnea (OSA)  RECOMMENDATIONS:  1. This study demonstrates overall mild obstructive sleep apnea with a total AHI of 6.1/hour, REM AHI of 7.8/hour, supine AHI of 6.1 and O2 nadir of 87%. Treatment options include positive airway pressure with CPAP vs. AutoPAP, avoidance of supine sleep position along with weight loss, upper airway or jaw surgery in selected patients or the use of an oral appliance in certain patients. ENT evaluation and/or consultation with a  maxillofacial surgeon or dentist may be feasible in some instances.    2. The patient should be cautioned not to drive, work at heights, or operate dangerous or heavy equipment when tired or sleepy. Review and reiteration of good sleep hygiene measures should be pursued with any patient. 3. The patient will be seen in follow-up by Dr. Rexene Alberts at [facility] for discussion of the test results and further management strategies. The referring provider will be notified of the test results.  I certify that I have reviewed the entire raw data recording prior to the issuance of this report in accordance with the Standards of Accreditation of the American Academy of Sleep Medicine (AASM)  [name], MD, PhD Diplomat, American Board of Psychiatry and Neurology (Neurology and Sleep Medicine)

## 2017-08-07 NOTE — Progress Notes (Signed)
Patient seen by me on 06/26/17, diagnostic PSG on 08/05/17.    Please call and notify the patient that the recent sleep study overall mild obstructive sleep apnea with a total AHI of 6.1/hour, REM AHI of 7.8/hour, supine AHI of 6.1 and O2 nadir of 87%. Treatment options include positive airway pressure with CPAP vs. AutoPAP, avoidance of supine sleep position along with weight loss, upper airway or jaw surgery in selected patients or the use of an oral appliance in certain patients. ENT evaluation and/or consultation with a maxillofacial surgeon or dentist may be feasible in some instances. My recommendation: We could start with treatment in the form of autoPAP, which means, that we don't have to bring him back for a second sleep study with CPAP, but will let him try an autoPAP machine at home, through a DME company (of his choice, or as per insurance requirement). The DME representative will educate him on how to use the machine, how to put the mask on, etc. I can order autoPAP, if he would like to proceed, please let me know. If he prefers, we can make a FU appt first to discuss.   [name], MD, PhD [facility])   [name], MD, PhD [facility])

## 2017-08-07 NOTE — Telephone Encounter (Signed)
-----   Message from [name], MD sent at 08/07/2017  8:24 AM EDT ----- Patient seen by me on 06/26/17, diagnostic PSG on 08/05/17.    Please call and notify the patient that the recent sleep study overall mild obstructive sleep apnea with a total AHI of 6.1/hour, REM AHI of 7.8/hour, supine AHI of 6.1 and O2 nadir of 87%. Treatment options include positive airway pressure with CPAP vs. AutoPAP, avoidance of supine sleep position along with weight loss, upper airway or jaw surgery in selected patients or the use of an oral appliance in certain patients. ENT evaluation and/or consultation with a maxillofacial surgeon or dentist may be feasible in some instances. My recommendation: We could start with treatment in the form of autoPAP, which means, that we don't have to bring him back for a second sleep study with CPAP, but will let him try an autoPAP machine at home, through a DME company (of his choice, or as per insurance requirement). The DME representative will educate him on how to use the machine, how to put the mask on, etc. I can order autoPAP, if he would like to proceed, please let me know. If he prefers, we can make a FU appt first to discuss.   [name], MD, PhD [facility])   [name], MD, PhD [facility])

## 2017-08-07 NOTE — Telephone Encounter (Signed)
Called to discuss sleep study results. No answer at this time. LVM for the pt to return call.

## 2017-08-08 ENCOUNTER — Telehealth: Payer: Self-pay | Admitting: Neurology

## 2017-08-08 ENCOUNTER — Encounter: Payer: Self-pay | Admitting: Neurology

## 2017-08-08 NOTE — Telephone Encounter (Signed)
Called the patient and went over the sleep study results. I informed the patient that the sleep study showed that he had mild obstructive sleep apnea. Dr Rexene Alberts recommends the patient can treat this with a dental device or cpap or weight loss and sleeping on his side. The patient states that he would like to try the dental device route first. He wants to make sure that this will be covered from medical insurance. I informed him that I do not know the answer to that but that he could check with his dentist and see what they say when they work him up for that. Pt is going to call his dentist and ask them if they do this. Pt will contact us back and let us know so that we can place the referral. Pt verbalized understanding. Pt had no questions at this time but was encouraged to call back if questions arise.

## 2017-08-08 NOTE — Telephone Encounter (Signed)
-----   Message from [name], MD sent at 08/07/2017  8:24 AM EDT ----- Patient seen by me on 06/26/17, diagnostic PSG on 08/05/17.    Please call and notify the patient that the recent sleep study overall mild obstructive sleep apnea with a total AHI of 6.1/hour, REM AHI of 7.8/hour, supine AHI of 6.1 and O2 nadir of 87%. Treatment options include positive airway pressure with CPAP vs. AutoPAP, avoidance of supine sleep position along with weight loss, upper airway or jaw surgery in selected patients or the use of an oral appliance in certain patients. ENT evaluation and/or consultation with a maxillofacial surgeon or dentist may be feasible in some instances. My recommendation: We could start with treatment in the form of autoPAP, which means, that we don't have to bring him back for a second sleep study with CPAP, but will let him try an autoPAP machine at home, through a DME company (of his choice, or as per insurance requirement). The DME representative will educate him on how to use the machine, how to put the mask on, etc. I can order autoPAP, if he would like to proceed, please let me know. If he prefers, we can make a FU appt first to discuss.   [name], MD, PhD [facility])   [name], MD, PhD [facility])

## 2017-08-22 DIAGNOSIS — I10 Essential (primary) hypertension: Secondary | ICD-10-CM | POA: Insufficient documentation

## 2017-08-22 DIAGNOSIS — G4733 Obstructive sleep apnea (adult) (pediatric): Secondary | ICD-10-CM | POA: Insufficient documentation

## 2017-09-04 ENCOUNTER — Other Ambulatory Visit: Payer: Self-pay | Admitting: Gastroenterology

## 2017-09-04 DIAGNOSIS — R1011 Right upper quadrant pain: Secondary | ICD-10-CM

## 2017-09-10 ENCOUNTER — Other Ambulatory Visit: Payer: Self-pay | Admitting: Family Medicine

## 2017-09-12 ENCOUNTER — Ambulatory Visit (HOSPITAL_COMMUNITY): Admission: RE | Admit: 2017-09-12 | Payer: BLUE CROSS/BLUE SHIELD | Source: Ambulatory Visit

## 2017-10-03 ENCOUNTER — Ambulatory Visit (HOSPITAL_COMMUNITY)
Admission: RE | Admit: 2017-10-03 | Discharge: 2017-10-03 | Disposition: A | Discharge status: Home / Self Care | Payer: BLUE CROSS/BLUE SHIELD | Source: Ambulatory Visit | Attending: Gastroenterology | Admitting: Gastroenterology

## 2017-10-03 DIAGNOSIS — R1011 Right upper quadrant pain: Secondary | ICD-10-CM | POA: Insufficient documentation

## 2017-10-03 MED ORDER — TECHNETIUM TC 99M MEBROFENIN IV KIT
5.2000 | PACK | Freq: Once | INTRAVENOUS | Status: AC | PRN
  Administered 2017-10-03: 5.2 via INTRAVENOUS

## 2017-10-21 ENCOUNTER — Telehealth: Payer: Self-pay

## 2017-10-21 ENCOUNTER — Other Ambulatory Visit: Payer: Self-pay | Admitting: *Deleted

## 2017-10-21 NOTE — Telephone Encounter (Signed)
Left a voicemail for patient to return phone call to verify Establish care

## 2017-10-21 NOTE — Telephone Encounter (Signed)
We need to clarify.  It appears he has been going to [facility].  Can you clarify.   If he is now seeing them as primary they will need to refill.

## 2017-10-30 ENCOUNTER — Telehealth: Payer: Self-pay | Admitting: *Deleted

## 2017-10-30 NOTE — Telephone Encounter (Signed)
clonazePAM (KLONOPIN) 0.5 MG tablet [facility] Last refill 04/17/17 and last office visit 05/03/17.  Okay to fill?

## 2017-10-30 NOTE — Telephone Encounter (Signed)
Left message on machine for patient to return our call 

## 2017-10-30 NOTE — Telephone Encounter (Signed)
See my telephone note from 10/21/16. We need to clarify if he is going to [facility]. It appears that he has been going there and if so they will need to take over refilling this controlled medication

## 2017-11-05 DIAGNOSIS — I498 Other specified cardiac arrhythmias: Secondary | ICD-10-CM | POA: Insufficient documentation

## 2017-11-05 NOTE — Telephone Encounter (Signed)
Per office note, patient is now a patient at [facility].

## 2017-11-08 DIAGNOSIS — M7542 Impingement syndrome of left shoulder: Secondary | ICD-10-CM | POA: Insufficient documentation

## 2017-11-11 ENCOUNTER — Other Ambulatory Visit: Payer: Self-pay

## 2017-12-09 ENCOUNTER — Emergency Department (HOSPITAL_COMMUNITY)
Admission: EM | Admit: 2017-12-09 | Discharge: 2017-12-09 | Disposition: A | Discharge status: Home / Self Care | Payer: BLUE CROSS/BLUE SHIELD | Attending: Emergency Medicine | Admitting: Emergency Medicine

## 2017-12-09 DIAGNOSIS — R030 Elevated blood-pressure reading, without diagnosis of hypertension: Secondary | ICD-10-CM | POA: Diagnosis present

## 2017-12-09 DIAGNOSIS — Z5321 Procedure and treatment not carried out due to patient leaving prior to being seen by health care provider: Secondary | ICD-10-CM | POA: Insufficient documentation

## 2017-12-09 NOTE — ED Notes (Signed)
Pt 's wife on the phone with his surgeon, Dr. Durward Fortes pt to come straight to his office so he can physically evaluate him.

## 2017-12-20 ENCOUNTER — Other Ambulatory Visit: Payer: Self-pay

## 2017-12-30 DIAGNOSIS — M6520 Calcific tendinitis, unspecified site: Secondary | ICD-10-CM | POA: Insufficient documentation

## 2017-12-30 DIAGNOSIS — M75102 Unspecified rotator cuff tear or rupture of left shoulder, not specified as traumatic: Secondary | ICD-10-CM | POA: Insufficient documentation

## 2018-01-06 ENCOUNTER — Ambulatory Visit (INDEPENDENT_AMBULATORY_CARE_PROVIDER_SITE_OTHER): Payer: BLUE CROSS/BLUE SHIELD | Admitting: Rehabilitative and Restorative Service Providers"

## 2018-01-06 ENCOUNTER — Encounter: Payer: Self-pay | Admitting: Rehabilitative and Restorative Service Providers"

## 2018-01-06 DIAGNOSIS — R293 Abnormal posture: Secondary | ICD-10-CM

## 2018-01-06 DIAGNOSIS — R29898 Other symptoms and signs involving the musculoskeletal system: Secondary | ICD-10-CM

## 2018-01-06 DIAGNOSIS — R600 Localized edema: Secondary | ICD-10-CM

## 2018-01-06 DIAGNOSIS — M25512 Pain in left shoulder: Secondary | ICD-10-CM

## 2018-01-06 NOTE — Therapy (Addendum)
[facility] [address] [place], [place], [address] Phone: [phone]   Fax:  [phone]  Physical Therapy Evaluation  Patient Details  Name: Tim Gordon MRN: [id number] Date of Birth: [birth year] Referring Provider: Dr Susa Day    Encounter Date: 01/06/2018  PT End of Session - 01/06/18 0843    Visit Number  1    Number of Visits  24    Date for PT Re-Evaluation  03/31/18    PT Start Time  0844    PT Stop Time  0945    PT Time Calculation (min)  61 min    Activity Tolerance  Patient tolerated treatment well       Past Medical History:  Diagnosis Date  . Allergy   . Blood in stool   . Diverticulitis   . GERD (gastroesophageal reflux disease)    CONTROLLED WITH DIET - NO MEDS  . Heart murmur    MVP - NEVER CAUSES ANY PROBLEMS; PT VERY ACTIVE - CAN PLAY RACKETBALL FOR COUPLE OF HOURS AND NEVER ANY CHEST C/O  . History of kidney stones   . Hyperlipidemia   . Migraines    OCULAR MIGRAINES  . Sleep apnea    STATES SLEEP STUDY 4 OR 5 YEARS AGO - TOLD BORDERLINE - TRIED CPAP FOR 30 DAYS - STATES IT DID NOT HELP - NO LONGER USES.    Past Surgical History:  Procedure Laterality Date  . APPENDECTOMY    . COLON SURGERY    . LAPAROSCOPIC PARTIAL COLECTOMY N/A 06/24/2014   Procedure: LAPAROSCOPIC ASSISTED  PARTIAL COLECTOMY;  Surgeon: Jackolyn Confer, MD;  Location: [facility];  Service: General;  Laterality: N/A;  . NASAL RECONSTRUCTION WITH SEPTAL REPAIR    . SPINE SURGERY  2001   rupture L4-L5  . VASECTOMY      There were no vitals filed for this visit.   Subjective Assessment - 01/06/18 0852    Subjective  Problems with Lt shoudler for the past two years. 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 He fell forward catching himself on the arms mid February. Symptoms increased significantly over the next two weeks. He underwent Lt RCR 12/20/17.     Pertinent History  LBP with lumbar surgery;  shoulder pain and dysfunction for two yrs prior to surgery     Diagnostic tests  MRI     Patient Stated Goals  restore use of Lt arm     Currently in Pain?  Yes    Pain Score  2     Pain Location  Shoulder    Pain Orientation  Left    Pain Descriptors / Indicators  Tightness;Tender;Aching    Pain Radiating Towards  musculature around the shoulder     Pain Onset  More than a month ago    Pain Frequency  Intermittent    Aggravating Factors   using the Lt arm     Pain Relieving Factors  meds         [facility] PT Assessment - 01/06/18 0001      Assessment   Medical Diagnosis  Lt RCR    Referring Provider  Dr Susa Day     Onset Date/Surgical Date  12/20/17    Hand Dominance  Right    Next MD Visit  01/27/18    Prior Therapy  none       Precautions   Precautions  Shoulder    Type of Shoulder  Precautions  post RCR       Balance Screen   Has the patient fallen in the past 6 months  Yes    How many times?  1    Has the patient had a decrease in activity level because of a fear of falling?   No    Is the patient reluctant to leave their home because of a fear of falling?   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     Leisure  Copywriter, advertising; golf; yard work; household activites       Observation/Other Assessments   Focus on Therapeutic Outcomes (FOTO)   60% limitation      Sensation   Additional Comments  WFL's per pt report       Posture/Postural Control   Posture Comments  head forward shoulders rounded and elevated; head of the humerus anterior in orientation; scapulae abducted and rotated along the thoracic wall       AROM   Right Shoulder Extension  70 Degrees    Right Shoulder Flexion  162 Degrees    Right Shoulder ABduction  172 Degrees    Right Shoulder Internal Rotation  36 Degrees     Right Shoulder External Rotation  92 Degrees      Strength   Overall Strength Comments  5/5 Rt UE - NT Lt - decreased functional strength posterior shoulder girdle       Palpation   Spinal mobility  hypomobile through the thoraicic spine with CPA and lateral mobs     Palpation comment  muscular tightness Lt > Rt pecs; upper trap; leveator; teres; posterior/lateral cervical musculature                 Objective measurements completed on examination: See above findings.      [facility] Adult PT Treatment/Exercise - 01/06/18 0001      Neuro Re-ed    Neuro Re-ed Details   postural education and correction working to engage posterior shoulder girdle       Shoulder Exercises: Standing   Other Standing Exercises  axial extension 10 sec x 5; scap squeeze 10 sec x 10 with noodle       Shoulder Exercises: ROM/Strengthening   Pendulum  10 CW/CCW       Shoulder Exercises: Stretch   External Rotation Stretch  5 reps;10 seconds standing with noodle - using cane to assist w/ ER     Table Stretch - Flexion  5 reps;10 seconds    Table Stretch -Flexion Limitations  sitting with Lt side at table for table slide       Acupuncturist Location  15    Electrical Stimulation Action  IFC    Electrical Stimulation Parameters  to tolerance    Electrical Stimulation Goals  Pain;Tone      Vasopneumatic   Number Minutes Vasopneumatic   15 minutes    Vasopnuematic Location   Shoulder Lt     Vasopneumatic Pressure  Low    Vasopneumatic Temperature   34 degrees        Patient will receive vasopneumatic treatment following exercise and manual work to decrease pain and prevent edema after passive range of motion and other exercises as allowed per shoulder  rehab protocol.       PT Education - 01/06/18 [phone]    Education provided  Yes    Education Details  HEP TENs ball release work     [employer]) Educated  Patient    Methods  Explanation;Demonstration;Tactile  cues;Verbal cues;Handout    Comprehension  Verbalized understanding;Returned demonstration;Verbal cues required;Tactile cues required       PT Short Term Goals - 01/06/18 1330      PT SHORT TERM GOAL #1   Title  Improve posture and alignment with patient to demonstrate upright posture with posterior shoulder girdle musculature engaged 02/17/18    Time  6    Period  Weeks    Status  New      PT SHORT TERM GOAL #2   Title  PROM to 150 deg elevation; 80-85 deg ER in scapular plane 02/17/18    Time  6    Period  Weeks    Status  New      PT SHORT TERM GOAL #3   Title  Progress with stabilization and AROM exercises per MD protocol 02/17/18    Time  6    Period  Weeks    Status  New      PT SHORT TERM GOAL #4   Title  Decrease pain to 0/10 at rest and no more than 2-4/10 with exercises and functional activities allowed by protocol 02/17/18    Time  6    Period  Weeks    Status  New        PT Long Term Goals - 01/06/18 1345      PT LONG TERM GOAL #1   Title  Full AROM Lt shoulder equal or greater that AROM Rt shoulder 03/31/18    Time  12    Period  Weeks    Status  New      PT LONG TERM GOAL #2   Title  4/5 to 4+/5 Lt shoulder strength 03/31/18    Time  12    Period  Weeks    Status  New      PT LONG TERM GOAL #3   Title  Progress with strengthening and functional activities allowing patient to return to normal activities as indicated 03/31/18    Time  6    Period  Weeks    Status  New      PT LONG TERM GOAL #4   Title  Independent in HEP 03/31/18    Time  6    Period  Weeks    Status  New      PT LONG TERM GOAL #5   Title  Improve FOTO to </= 36% limitation 03/31/18    Time  6    Period  Weeks    Status  New             Plan - 01/06/18 1325    Clinical Impression Statement  Tim Gordon presents s/p RCR Lt shoulder 12/20/17. He has poor posture and alignment; limited shoulder ROM and strength; decreased functional abilities Lt UE; and Lt shoulder pain. He will  benefit form PT to address problems identifited.     History and Personal Factors relevant to plan of care:  history of Lt shoulder pain for ~ 2 yrs prior to injury/surgery    Clinical Presentation  Evolving    Clinical Decision Making  Low    PT Frequency  2x / week    PT Duration  12 weeks  PT Treatment/Interventions  Patient/family education;ADLs/Self Care Home Management;Cryotherapy;Electrical Stimulation;Iontophoresis 4mg /ml Dexamethasone;Moist Heat;Ultrasound;Dry needling;Manual techniques;Neuromuscular re-education;Therapeutic activities;Therapeutic exercise    PT Next Visit Plan  review HEP; begin soft tissue work and PROM Lt shoulder; measure PROM Lt shoulder; continue education and porgression of rehab per MD protocol; modlaities as indicated     Consulted and Agree with Plan of Care  Patient       Patient will benefit from skilled therapeutic intervention in order to improve the following deficits and impairments:  Postural dysfunction, Improper body mechanics, Pain, Increased fascial restricitons, Increased muscle spasms, Impaired UE functional use, Decreased mobility, Decreased range of motion, Decreased strength, Decreased activity tolerance  Visit Diagnosis: Acute pain of left shoulder - Plan: PT plan of care cert/re-cert  Abnormal posture - Plan: PT plan of care cert/re-cert  Other symptoms and signs involving the musculoskeletal system - Plan: PT plan of care cert/re-cert     Problem List Patient Active Problem List   Diagnosis Date Noted  . Diverticulitis large intestine 06/24/2014  . 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 History of migraine headaches 06/26/2012  . Hyperlipidemia 06/26/2012  . Ocular migraine 06/26/2012  . Essential tremor 06/26/2012  . Chronic insomnia 06/26/2012    Celyn Nilda Simmer PT, MPH  01/06/2018, 1:49 PM  [facility] [place], [place], [address] Phone: [phone]   Fax:  [phone]  Name: Tim Gordon MRN: [id number] Date of Birth: April 28, 1961

## 2018-01-06 NOTE — Patient Instructions (Addendum)
Axial Extension (Chin Tuck)    Pull chin in and lengthen back of neck. Hold __5__ seconds while counting out loud. Repeat __10__ times. Do __several__ sessions per day.  Shoulder Blade Squeeze    Rotate shoulders back, then squeeze shoulder blades down and back. Hold 10 sec Repeat __10__ times. Do __several __ sessions per day.   External Rotator Cuff Stretch, Supine (Passive) standing     Lie supine, one elbow against ribs, and bent at 90, dowel in palm, other hand holding dowel up. Use other arm to push forearm toward floor. Keep elbow against side. Hold __10_ seconds. Repeat _10__ times per session. Do _2__ sessions per day.    External Rotator Cuff Stretch, Sitting (Passive)    Sit with elbow bent, forearm on table, palm down. Bend forward at waist until a stretch is felt in shoulder. Hold __10_ seconds.  Repeat __10_ times per session. Do __2-3_ sessions per day.   Pendulum Circular    Bend forward 90 at waist, leaning on table for support. Rock body in a circular pattern to move arm clockwise _30___ times then counterclockwise _30___ times. Do _2-3___ sessions per day.   TENS UNIT: This is helpful for muscle pain and spasm.   Search and Purchase a TENS 7000 2nd edition at www.tenspros.com. It should be less than $30.     TENS unit instructions: Do not shower or bathe with the unit on Turn the unit off before removing electrodes or batteries If the electrodes lose stickiness add a drop of water to the electrodes after they are disconnected from the unit and place on plastic sheet. If you continued to have difficulty, call the TENS unit company to purchase more electrodes. Do not apply lotion on the skin area prior to use. Make sure the skin is clean and dry as this will help prolong the life of the electrodes. After use, always check skin for unusual red areas, rash or other skin difficulties. If there are any skin problems, does not apply electrodes to the  same area. Never remove the electrodes from the unit by pulling the wires. Do not use the TENS unit or electrodes other than as directed. Do not change electrode placement without consultating your therapist or physician. Keep 2 fingers with between each electrode.

## 2018-01-10 ENCOUNTER — Ambulatory Visit (INDEPENDENT_AMBULATORY_CARE_PROVIDER_SITE_OTHER): Payer: BLUE CROSS/BLUE SHIELD | Admitting: Physical Therapy

## 2018-01-10 ENCOUNTER — Encounter: Payer: Self-pay | Admitting: Physical Therapy

## 2018-01-10 DIAGNOSIS — M25512 Pain in left shoulder: Secondary | ICD-10-CM

## 2018-01-10 DIAGNOSIS — R293 Abnormal posture: Secondary | ICD-10-CM

## 2018-01-10 DIAGNOSIS — R29898 Other symptoms and signs involving the musculoskeletal system: Secondary | ICD-10-CM

## 2018-01-10 DIAGNOSIS — R600 Localized edema: Secondary | ICD-10-CM

## 2018-01-10 NOTE — Therapy (Addendum)
[place] [facility] [place], [place], [address] Phone: [phone]   Fax:  [phone]  Physical Therapy Treatment  Patient Details  Name: Tim Gordon MRN: [id number] Date of Birth: 1960/12/05 Referring Provider: Dr. Susa Day   Encounter Date: 01/10/2018  PT End of Session - 01/10/18 0903    Visit Number  2    Number of Visits  24    Date for PT Re-Evaluation  03/31/18    PT Start Time  0850    PT Stop Time  0947    PT Time Calculation (min)  57 min       Past Medical History:  Diagnosis Date  . Allergy   . Blood in stool   . Diverticulitis   . GERD (gastroesophageal reflux disease)    CONTROLLED WITH DIET - NO MEDS  . Heart murmur    MVP - NEVER CAUSES ANY PROBLEMS; PT VERY ACTIVE - CAN PLAY RACKETBALL FOR COUPLE OF HOURS AND NEVER ANY CHEST C/O  . History of kidney stones   . Hyperlipidemia   . Migraines    OCULAR MIGRAINES  . Sleep apnea    STATES SLEEP STUDY 4 OR 5 YEARS AGO - TOLD BORDERLINE - TRIED CPAP FOR 30 DAYS - STATES IT DID NOT HELP - NO LONGER USES.    Past Surgical History:  Procedure Laterality Date  . APPENDECTOMY    . COLON SURGERY    . LAPAROSCOPIC PARTIAL COLECTOMY N/A 06/24/2014   Procedure: LAPAROSCOPIC ASSISTED  PARTIAL COLECTOMY;  Surgeon: Jackolyn Confer, MD;  Location: [facility];  Service: General;  Laterality: N/A;  . NASAL RECONSTRUCTION WITH SEPTAL REPAIR    . SPINE SURGERY  2001   rupture L4-L5  . VASECTOMY      There were no vitals filed for this visit.  Subjective Assessment - 01/10/18 0904    Subjective  Pt reports he has been doing his exercises daily.  Otherwise no new changes.      Currently in Pain?  Yes    Pain Score  3     Pain Location  Shoulder    Pain Orientation  Left    Pain Descriptors / Indicators  Tender    Aggravating Factors   using the Lt arm    Pain Relieving Factors  rest in sling.          [facility] PT Assessment - 01/10/18 0001       Assessment   Medical Diagnosis  Lt RCR    Referring Provider  Dr. Susa Day    Onset Date/Surgical Date  12/20/17    Hand Dominance  Right    Next MD Visit  01/27/18    Prior Therapy  none       ROM / Strength   AROM / PROM / Strength  PROM      PROM   PROM Assessment Site  Shoulder    Right/Left Shoulder  Left    Left Shoulder Flexion  130 Degrees    Left Shoulder ABduction  125 Degrees scaption    Left Shoulder External Rotation  35 Degrees scaption, supported by therapist.        [facility] Adult PT Treatment/Exercise - 01/10/18 0001      Self-Care   Self-Care  Other Self-Care Comments    Other Self-Care Comments   reviewed self massage to posterior shoulder girdle with ball.  Pt verbalized understanding and returned demo.       Neuro  Re-ed    Neuro Re-ed Details   continued postural education and correction working to engage posterior shoulder girdle       Elbow Exercises   Elbow Flexion  15 reps;Both;Standing canex 5 reps, 1# 10 reps      Shoulder Exercises: Standing   External Rotation  PROM;Left;10 reps with cane per HEP    Other Standing Exercises  scap squeeze with back against pool noodle x 10 sec x 10 reps, chin tucks x 3 reps of 5 sec; shoulder rolls x 5 reps       Shoulder Exercises: ROM/Strengthening   Pendulum  10 CW/CCW       Shoulder Exercises: Isometric Strengthening   Flexion  -- 10 sec x 10 reps    Extension  -- 10 sec x 10 reps    External Rotation  -- 10 sec x 10 reps    Internal Rotation  -- 10 sec x 10 reps      Shoulder Exercises: Stretch   Table Stretch - Flexion  -- reviewed and gave demo; reminded pt it is passive.       Engineer, building services  [employer]    Electrical Stimulation Parameters  to tolerance     Electrical Stimulation Goals  Pain      Vasopneumatic   Number Minutes Vasopneumatic   15 minutes    Vasopnuematic Location   Shoulder Lt    Vasopneumatic  Pressure  Low    Vasopneumatic Temperature   34 degrees       Manual Therapy   Manual Therapy  Passive ROM    Passive ROM  Lt shoulder, PROM into flexion, scaption, and ER - all to soft tissue limits and no pain.         Vasopneumatic treatment was performed to decrease pain and prevent edema after passive range of motion and other exercises, as allowed per shoulder rehab protocol.          PT Short Term Goals - 01/10/18 1703      PT SHORT TERM GOAL #1   Title  Improve posture and alignment with patient to demonstrate upright posture with posterior shoulder girdle musculature engaged 02/17/18    Time  6    Period  Weeks    Status  On-going      PT SHORT TERM GOAL #2   Title  PROM to 150 deg elevation; 80-85 deg ER in scapular plane 02/17/18    Time  6    Period  Weeks    Status  On-going      PT SHORT TERM GOAL #3   Title  Progress with stabilization and AROM exercises per MD protocol 02/17/18    Time  6    Period  Weeks    Status  On-going      PT SHORT TERM GOAL #4   Title  Decrease pain to 0/10 at rest and no more than 2-4/10 with exercises and functional activities allowed by protocol 02/17/18    Time  6    Period  Weeks    Status  On-going        PT Long Term Goals - 01/10/18 1702      PT LONG TERM GOAL #1   Title  Full AROM Lt shoulder equal or greater that AROM Rt shoulder 03/31/18    Time  12    Period  Weeks  Status  On-going      PT LONG TERM GOAL #2   Title  4/5 to 4+/5 Lt shoulder strength 03/31/18    Time  12    Period  Weeks    Status  On-going      PT LONG TERM GOAL #3   Title  Progress with strengthening and functional activities allowing patient to return to normal activities as indicated 03/31/18    Time  6    Period  Weeks    Status  On-going      PT LONG TERM GOAL #4   Title  Independent in HEP 03/31/18    Time  6    Period  Weeks    Status  On-going      PT LONG TERM GOAL #5   Title  Improve FOTO to </= 36% limitation 03/31/18     Time  6    Period  Weeks    Status  On-going            Plan - 01/10/18 1705    Clinical Impression Statement  Pt is 3 wks s/p Lt RCR. He required minor cues throughout session for posture. He tolerated PROM to Lt shoulder with minimal guarding. Pt progressing well within rehab protocol.     PT Frequency  2x / week    PT Duration  12 weeks    PT Treatment/Interventions  Patient/family education;ADLs/Self Care Home Management;Cryotherapy;Electrical Stimulation;Iontophoresis 4mg /ml Dexamethasone;Moist Heat;Ultrasound;Dry needling;Manual techniques;Neuromuscular re-education;Therapeutic activities;Therapeutic exercise    PT Next Visit Plan  begin soft tissue work and PROM Lt shoulder; continue education and porgression of rehab per MD protocol; modalities as indicated     Consulted and Agree with Plan of Care  Patient       Patient will benefit from skilled therapeutic intervention in order to improve the following deficits and impairments:  Postural dysfunction, Improper body mechanics, Pain, Increased fascial restricitons, Increased muscle spasms, Impaired UE functional use, Decreased mobility, Decreased range of motion, Decreased strength, Decreased activity tolerance  Visit Diagnosis: Acute pain of left shoulder  Abnormal posture  Other symptoms and signs involving the musculoskeletal system     Problem List Patient Active Problem List   Diagnosis Date Noted  . Diverticulitis large intestine 06/24/2014  . 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 History of migraine headaches 06/26/2012  . Hyperlipidemia 06/26/2012  . Ocular migraine 06/26/2012  . Essential tremor 06/26/2012  . Chronic insomnia 06/26/2012   Kerin Perna, PTA 01/10/18 5:06 PM  [place], [place], [address] Phone: [phone]   Fax:  [phone]  Name: Tim Gordon MRN: [id number] Date of Birth: [birth year]

## 2018-01-10 NOTE — Patient Instructions (Signed)
TWO FINGERS ONLY  Strengthening: Isometric Flexion  Using wall for resistance, press right fist into ball using light pressure. Hold _10___ seconds. Repeat _10__ times per set. Do __1__ sets per session. Do __3__ sessions per day.  Extension (Isometric)  Place left bent elbow and back of arm against wall. Press elbow against wall. Hold __10__ seconds. Repeat __10__ times. Do _2-3___ sessions per day.  Internal Rotation (Isometric)  Place palm of right fist against door frame, with elbow bent. Press fist against door frame. Hold __10__ seconds. Repeat _10___ times. Do __3__ sessions per day.  External Rotation (Isometric)  Place back of left fist against door frame, with elbow bent. Press fist against door frame. Hold _10___ seconds. Repeat __10__ times. Do _3___ sessions per day.   [facility] [place],  [address]  [phone] (office) [phone] (fax)

## 2018-01-15 ENCOUNTER — Ambulatory Visit (INDEPENDENT_AMBULATORY_CARE_PROVIDER_SITE_OTHER): Payer: BLUE CROSS/BLUE SHIELD | Admitting: Physical Therapy

## 2018-01-15 DIAGNOSIS — R29898 Other symptoms and signs involving the musculoskeletal system: Secondary | ICD-10-CM

## 2018-01-15 DIAGNOSIS — M25512 Pain in left shoulder: Secondary | ICD-10-CM

## 2018-01-15 DIAGNOSIS — R600 Localized edema: Secondary | ICD-10-CM

## 2018-01-15 DIAGNOSIS — R293 Abnormal posture: Secondary | ICD-10-CM

## 2018-01-15 NOTE — Therapy (Addendum)
Tim [place], [place], [address] Phone: [phone]   Fax:  [phone]  Physical Therapy Treatment  Patient Details  Name: Tim Gordon MRN: [id number] Date of Birth: 02/24/1961 Referring Provider: Dr. Susa Day   Encounter Date: 01/15/2018  PT End of Session - 01/15/18 0849    Visit Number  3    Number of Visits  24    Date for PT Re-Evaluation  03/31/18    PT Start Time  0847    PT Stop Time  0938    PT Time Calculation (min)  51 min    Activity Tolerance  Patient tolerated treatment well    Behavior During Therapy  [facility] for tasks assessed/performed       Past Medical History:  Diagnosis Date  . Allergy   . Blood in stool   . Diverticulitis   . GERD (gastroesophageal reflux disease)    CONTROLLED WITH DIET - NO MEDS  . Heart murmur    MVP - NEVER CAUSES ANY PROBLEMS; PT VERY ACTIVE - CAN PLAY RACKETBALL FOR COUPLE OF HOURS AND NEVER ANY CHEST C/O  . History of kidney stones   . Hyperlipidemia   . Migraines    OCULAR MIGRAINES  . Sleep apnea    STATES SLEEP STUDY 4 OR 5 YEARS AGO - TOLD BORDERLINE - TRIED CPAP FOR 30 DAYS - STATES IT DID NOT HELP - NO LONGER USES.    Past Surgical History:  Procedure Laterality Date  . APPENDECTOMY    . COLON SURGERY    . LAPAROSCOPIC PARTIAL COLECTOMY N/A 06/24/2014   Procedure: LAPAROSCOPIC ASSISTED  PARTIAL COLECTOMY;  Surgeon: Jackolyn Confer, MD;  Location: [facility];  Service: General;  Laterality: N/A;  . NASAL RECONSTRUCTION WITH SEPTAL REPAIR    . SPINE SURGERY  2001   rupture L4-L5  . VASECTOMY      There were no vitals filed for this visit.  Subjective Assessment - 01/15/18 0850    Subjective  Pt wears sling 50% of the time, putting it on when his shoulder gets tired. He also props his Lt UE on a pillow when sitting. He tried helping dog in middle of night (scooting him onto rug with both hands) and this reminded him of his shoulder  surgery.  Otherwise, very compliant with rehab protocol.     Currently in Pain?  No/denies    Pain Score  0-No pain    Aggravating Factors   using the Lt arm         [facility] PT Assessment - 01/15/18 0001      Assessment   Medical Diagnosis  Lt RCR    Referring Provider  Dr. Susa Day    Onset Date/Surgical Date  12/20/17    Hand Dominance  Right    Next MD Visit  01/27/18    Prior Therapy  none       PROM   PROM Assessment Site  Shoulder    Right/Left Shoulder  Left    Left Shoulder Extension  31 Degrees    Left Shoulder Flexion  137 Degrees    Left Shoulder ABduction  150 Degrees scaption    Left Shoulder External Rotation  62 Degrees        [facility] Adult PT Treatment/Exercise - 01/15/18 0001      Elbow Exercises   Elbow Flexion  Standing;Left;Bar weights/barbell;20 reps 1#  Elbow ext with red band, arm in neutral x 10 reps,  2 sets     Shoulder Exercises: Standing   External Rotation  PROM;Left;10 reps with cane per HEP    Other Standing Exercises  scap squeeze with back against pool noodle x 10 sec x 10 reps, chin tucks x 3 reps of 5 sec; shoulder rolls x 5 reps       Shoulder Exercises: Isometric Strengthening   ABduction  -- 10x10sec , minimal contraction.      Shoulder Exercises: Stretch   Table Stretch - Flexion  5 reps    Table Stretch -Flexion Limitations  sitting with Lt side at table for table slide       Acupuncturist Stimulation Location  -- pt declined      Vasopneumatic   Number Minutes Vasopneumatic   15 minutes    Vasopnuematic Location   Shoulder    Vasopneumatic Pressure  Low    Vasopneumatic Temperature   34 degrees       Manual Therapy   Manual Therapy  Passive ROM    Passive ROM  Lt shoulder, PROM into flexion, scaption, extension and ER - all to soft tissue limits and no pain.       STM and MFR to Lt pec major and lat to decrease fascial tightness.    HEP - added bicep curl and tricep ext (both Lt arm in neutral  position).   Abduction isometric to add after 4 wks of protocol.      Vasopneumatic treatment was performed to decrease pain and prevent edema after passive range of motion and other exercises, as allowed per shoulder rehab protocol.     PT Short Term Goals - 01/10/18 1703      PT SHORT TERM GOAL #1   Title  Improve posture and alignment with patient to demonstrate upright posture with posterior shoulder girdle musculature engaged 02/17/18    Time  6    Period  Weeks    Status  On-going      PT SHORT TERM GOAL #2   Title  PROM to 150 deg elevation; 80-85 deg ER in scapular plane 02/17/18    Time  6    Period  Weeks    Status  On-going      PT SHORT TERM GOAL #3   Title  Progress with stabilization and AROM exercises per MD protocol 02/17/18    Time  6    Period  Weeks    Status  On-going      PT SHORT TERM GOAL #4   Title  Decrease pain to 0/10 at rest and no more than 2-4/10 with exercises and functional activities allowed by protocol 02/17/18    Time  6    Period  Weeks    Status  On-going        PT Long Term Goals - 01/10/18 1702      PT LONG TERM GOAL #1   Title  Full AROM Lt shoulder equal or greater that AROM Rt shoulder 03/31/18    Time  12    Period  Weeks    Status  On-going      PT LONG TERM GOAL #2   Title  4/5 to 4+/5 Lt shoulder strength 03/31/18    Time  12    Period  Weeks    Status  On-going      PT LONG TERM GOAL #3   Title  Progress with strengthening and functional activities allowing patient to return to normal activities  as indicated 03/31/18    Time  6    Period  Weeks    Status  On-going      PT LONG TERM GOAL #4   Title  Independent in HEP 03/31/18    Time  6    Period  Weeks    Status  On-going      PT LONG TERM GOAL #5   Title  Improve FOTO to </= 36% limitation 03/31/18    Time  6    Period  Weeks    Status  On-going            Plan - 01/15/18 1244    Clinical Impression Statement  Pt was less guarded with PROM.  He  tolerated new elbow flex/ext exercises well.  Pt reported increased pain in Lt bicep tendon area after vaso treatment; pt felt maybe pressure was too high (on low; reported at completion of treatment).  Pt progressing well in protocol.     PT Frequency  2x / week    PT Duration  12 weeks    PT Treatment/Interventions  Patient/family education;ADLs/Self Care Home Management;Cryotherapy;Electrical Stimulation;Iontophoresis 4mg /ml Dexamethasone;Moist Heat;Ultrasound;Dry needling;Manual techniques;Neuromuscular re-education;Therapeutic activities;Therapeutic exercise    PT Next Visit Plan  begin soft tissue work and PROM Lt shoulder; continue education and porgression of rehab per MD protocol; modalities as indicated     Consulted and Agree with Plan of Care  Patient       Patient will benefit from skilled therapeutic intervention in order to improve the following deficits and impairments:  Postural dysfunction, Improper body mechanics, Pain, Increased fascial restricitons, Increased muscle spasms, Impaired UE functional use, Decreased mobility, Decreased range of motion, Decreased strength, Decreased activity tolerance  Visit Diagnosis: Acute pain of left shoulder  Abnormal posture  Other symptoms and signs involving the musculoskeletal system     Problem List Patient Active Problem List   Diagnosis Date Noted  . Diverticulitis large intestine 06/24/2014  . 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 History of migraine headaches 06/26/2012  . Hyperlipidemia 06/26/2012  . Ocular migraine 06/26/2012  . Essential tremor 06/26/2012  . Chronic insomnia 06/26/2012   Kerin Perna, PTA 01/15/18 1:03 PM  [place], [place], [address] Phone: [phone]   Fax:  [phone]  Name: Tim Gordon MRN: [id number] Date of Birth: Jan 17, 1961

## 2018-01-15 NOTE — Patient Instructions (Signed)
Extension (Resistive Band)    With band looped around hand and wrist, use elbow movements only. Using other arm as anchor, straighten elbow, pushing down. Hold __3__ seconds, slowly return. Repeat __10_ times. Do __2__ sets  Elbow Flexion: Resisted    With right arm straight, thumb forward, Holding __1-2__ pound weight, bend elbow. Return slowly. Repeat __10__ times per set. Do __2__ sets per session. Do _1___ sessions per day.  External Rotation (Isometric)  Place back of left fist against door frame, with elbow bent. Press fist against door frame. Hold _10_ seconds. Repeat __10__ times. Do __2-3__ sessions per day.

## 2018-01-17 ENCOUNTER — Encounter: Payer: Self-pay | Admitting: Rehabilitative and Restorative Service Providers"

## 2018-01-17 ENCOUNTER — Ambulatory Visit (INDEPENDENT_AMBULATORY_CARE_PROVIDER_SITE_OTHER): Payer: BLUE CROSS/BLUE SHIELD | Admitting: Rehabilitative and Restorative Service Providers"

## 2018-01-17 DIAGNOSIS — R600 Localized edema: Secondary | ICD-10-CM

## 2018-01-17 DIAGNOSIS — M25512 Pain in left shoulder: Secondary | ICD-10-CM

## 2018-01-17 DIAGNOSIS — R29898 Other symptoms and signs involving the musculoskeletal system: Secondary | ICD-10-CM

## 2018-01-17 DIAGNOSIS — R293 Abnormal posture: Secondary | ICD-10-CM

## 2018-01-17 NOTE — Patient Instructions (Addendum)
SUPINE Tips A    Being in the supine position means to be lying on the back. Lying on the back is the position of least compression on the bones and discs of the spine, and helps to re-align the natural curves of the back. Work toward 3-5 min 1-2 times/day NO PAIN     Side Bend, Sitting    Sit, head in comfortable, centered position, chin tucked. Gently tilt head, bringing ear toward same-side shoulder. Hold __10-15_ seconds.  Repeat _2-3__ times per session. Do __3-4_ sessions per day.

## 2018-01-17 NOTE — Therapy (Addendum)
[place], [place], [address] Phone: [phone]   Fax:  [phone]  Physical Therapy Treatment  Patient Details  Name: Tim Gordon MRN: [id number] Date of Birth: 12/03/1960 Referring Provider: Dr. Susa Day   Encounter Date: 01/17/2018  PT End of Session - 01/17/18 0849    Visit Number  4    Number of Visits  24    Date for PT Re-Evaluation  03/31/18    PT Start Time  0847    PT Stop Time  0940    PT Time Calculation (min)  53 min    Activity Tolerance  Patient tolerated treatment well       Past Medical History:  Diagnosis Date  . Allergy   . Blood in stool   . Diverticulitis   . GERD (gastroesophageal reflux disease)    CONTROLLED WITH DIET - NO MEDS  . Heart murmur    MVP - NEVER CAUSES ANY PROBLEMS; PT VERY ACTIVE - CAN PLAY RACKETBALL FOR COUPLE OF HOURS AND NEVER ANY CHEST C/O  . History of kidney stones   . Hyperlipidemia   . Migraines    OCULAR MIGRAINES  . Sleep apnea    STATES SLEEP STUDY 4 OR 5 YEARS AGO - TOLD BORDERLINE - TRIED CPAP FOR 30 DAYS - STATES IT DID NOT HELP - NO LONGER USES.    Past Surgical History:  Procedure Laterality Date  . APPENDECTOMY    . COLON SURGERY    . LAPAROSCOPIC PARTIAL COLECTOMY N/A 06/24/2014   Procedure: LAPAROSCOPIC ASSISTED  PARTIAL COLECTOMY;  Surgeon: Jackolyn Confer, MD;  Location: [facility];  Service: General;  Laterality: N/A;  . NASAL RECONSTRUCTION WITH SEPTAL REPAIR    . SPINE SURGERY  2001   rupture L4-L5  . VASECTOMY      There were no vitals filed for this visit.  Subjective Assessment - 01/17/18 0900    Subjective  Still weaning from the sling - shoulder feels better without sling. Shoulder is feeling okay - some tightness in the back of the neck. Seems to have a headache from the chin tuck(modified - decresaed intensity). Having some pain in the Lt LE which he has had in the past from LBP - discussed possible causes  and modified positin for work with the noodle to engage core and have knees slightly bent. Also suggested pillow under knees when he is sleeping on his back.     Currently in Pain?  No/denies                       [facility] Adult PT Treatment/Exercise - 01/17/18 0001      Shoulder Exercises: Standing   External Rotation  PROM;Left;10 reps with cane per HEP    Other Standing Exercises  scap squeeze with back against pool noodle x 10 sec x 10 reps, chin tucks x 3 reps of 5 sec; shoulder rolls x 5 reps       Shoulder Exercises: Stretch   Table Stretch - Flexion  5 reps    Table Stretch -Flexion Limitations  sitting with Lt side at table for table slide     Other Shoulder Stretches  prolonged snow angel 2-3 min shoulders at ~ 40 deg abd     Other Shoulder Stretches  lateral cervical flexion 10-15 sec in chin tucked position x 3 each direction       Electrical Stimulation   Electrical Stimulation Location  Lt shoulder     Electrical Stimulation Action  IFC    Electrical Stimulation Parameters  to tolerance    Electrical Stimulation Goals  Pain;Tone      Vasopneumatic   Number Minutes Vasopneumatic   15 minutes    Vasopnuematic Location   Shoulder    Vasopneumatic Pressure  Low    Vasopneumatic Temperature   34 degrees       Manual Therapy   Manual therapy comments  pt supine     Joint Mobilization  gentle GH motion     Soft tissue mobilization  deep tissue work through the posterior lateral cervical musculature into the Lt scapular musculature     Myofascial Release  pecs    Scapular Mobilization  Lt     Passive ROM  Lt shoulder flexioiin to 90 deg; ER/IR in scapular plane        Treatment was concluded with e-stim and vaso to prevent inflammation and edema as well as decrease any pain in the Lt shoulder following treatment. Treatment included therapeutic exercise to address ROM limitations and manual work/PROM to increase PROM Lt shoulder.       PT Education -  01/17/18 1000    Education provided  Yes    Education Details  HEP; shoulder rehab protocol; postural correction    Person(s) Educated  Patient    Methods  Explanation;Demonstration;Tactile cues;Verbal cues;Handout    Comprehension  Verbalized understanding;Returned demonstration;Tactile cues required       PT Short Term Goals - 01/17/18 0850      PT SHORT TERM GOAL #1   Title  Improve posture and alignment with patient to demonstrate upright posture with posterior shoulder girdle musculature engaged 02/17/18    Time  6    Period  Weeks    Status  On-going      PT SHORT TERM GOAL #2   Title  PROM to 150 deg elevation; 80-85 deg ER in scapular plane 02/17/18    Time  6    Period  Weeks    Status  On-going      PT SHORT TERM GOAL #3   Title  Progress with stabilization and AROM exercises per MD protocol 02/17/18    Time  6    Status  On-going      PT SHORT TERM GOAL #4   Title  Decrease pain to 0/10 at rest and no more than 2-4/10 with exercises and functional activities allowed by protocol 02/17/18    Time  6    Period  Weeks    Status  On-going        PT Long Term Goals - 01/17/18 0849      PT LONG TERM GOAL #1   Title  Full AROM Lt shoulder equal or greater that AROM Rt shoulder 03/31/18    Time  12    Period  Weeks    Status  On-going      PT LONG TERM GOAL #2   Title  4/5 to 4+/5 Lt shoulder strength 03/31/18    Time  12    Period  Weeks    Status  On-going      PT LONG TERM GOAL #3   Title  Progress with strengthening and functional activities allowing patient to return to normal activities as indicated 03/31/18    Time  12    Period  Weeks    Status  On-going      PT LONG TERM GOAL #4  Title  Independent in HEP 03/31/18    Time  12    Period  Weeks    Status  On-going      PT LONG TERM GOAL #5   Title  Improve FOTO to </= 36% limitation 03/31/18    Time  12    Period  Weeks    Status  On-going            Plan - 01/17/18 0904    Clinical  Impression Statement  Continued gradual progress with PROM Lt shoulder. Continues to require education re rehab protocol. Provided patient with a copy of the protocol.     Rehab Potential  Good    PT Frequency  2x / week    PT Duration  12 weeks    PT Treatment/Interventions  Patient/family education;ADLs/Self Care Home Management;Cryotherapy;Electrical Stimulation;Iontophoresis 4mg /ml Dexamethasone;Moist Heat;Ultrasound;Dry needling;Manual techniques;Neuromuscular re-education;Therapeutic activities;Therapeutic exercise    PT Next Visit Plan  begin soft tissue work and PROM Lt shoulder; continue education and porgression of rehab per MD protocol; modalities as indicated     Consulted and Agree with Plan of Care  Patient       Patient will benefit from skilled therapeutic intervention in order to improve the following deficits and impairments:  Postural dysfunction, Improper body mechanics, Pain, Increased fascial restricitons, Increased muscle spasms, Impaired UE functional use, Decreased mobility, Decreased range of motion, Decreased strength, Decreased activity tolerance  Visit Diagnosis: Acute pain of left shoulder  Abnormal posture  Other symptoms and signs involving the musculoskeletal system     Problem List Patient Active Problem List   Diagnosis Date Noted  . Diverticulitis large intestine 06/24/2014  . 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 History of migraine headaches 06/26/2012  . Hyperlipidemia 06/26/2012  . Ocular migraine 06/26/2012  . Essential tremor 06/26/2012  . Chronic insomnia 06/26/2012    Geddy Boydstun Nilda Simmer PT, MPH  01/17/2018, 10:01 AM  [facility] [place], [place], [address] Phone: [phone]   Fax:  [phone]  Name: Tim Gordon MRN: [id number] Date of Birth: 1961/01/16

## 2018-01-20 ENCOUNTER — Ambulatory Visit (INDEPENDENT_AMBULATORY_CARE_PROVIDER_SITE_OTHER): Payer: BLUE CROSS/BLUE SHIELD | Admitting: Physical Therapy

## 2018-01-20 DIAGNOSIS — R600 Localized edema: Secondary | ICD-10-CM

## 2018-01-20 DIAGNOSIS — R293 Abnormal posture: Secondary | ICD-10-CM

## 2018-01-20 DIAGNOSIS — R29898 Other symptoms and signs involving the musculoskeletal system: Secondary | ICD-10-CM

## 2018-01-20 DIAGNOSIS — M25512 Pain in left shoulder: Secondary | ICD-10-CM

## 2018-01-20 NOTE — Therapy (Addendum)
[place], [place], [address] Phone: [phone]   Fax:  [phone]  Physical Therapy Treatment  Patient Details  Name: Tim Gordon MRN: [id number] Date of Birth: September 16, 1961 Referring Provider: Dr. Susa Day   Encounter Date: 01/20/2018  PT End of Session - 01/20/18 0846    Visit Number  5    Number of Visits  24    Date for PT Re-Evaluation  03/31/18    PT Start Time  0848    PT Stop Time  0945    PT Time Calculation (min)  57 min    Activity Tolerance  Patient tolerated treatment well;No increased pain    Behavior During Therapy  WFL for tasks assessed/performed       Past Medical History:  Diagnosis Date  . Allergy   . Blood in stool   . Diverticulitis   . GERD (gastroesophageal reflux disease)    CONTROLLED WITH DIET - NO MEDS  . Heart murmur    MVP - NEVER CAUSES ANY PROBLEMS; PT VERY ACTIVE - CAN PLAY RACKETBALL FOR COUPLE OF HOURS AND NEVER ANY CHEST C/O  . History of kidney stones   . Hyperlipidemia   . Migraines    OCULAR MIGRAINES  . Sleep apnea    STATES SLEEP STUDY 4 OR 5 YEARS AGO - TOLD BORDERLINE - TRIED CPAP FOR 30 DAYS - STATES IT DID NOT HELP - NO LONGER USES.    Past Surgical History:  Procedure Laterality Date  . APPENDECTOMY    . COLON SURGERY    . LAPAROSCOPIC PARTIAL COLECTOMY N/A 06/24/2014   Procedure: LAPAROSCOPIC ASSISTED  PARTIAL COLECTOMY;  Surgeon: Jackolyn Confer, MD;  Location: [facility];  Service: General;  Laterality: N/A;  . NASAL RECONSTRUCTION WITH SEPTAL REPAIR    . SPINE SURGERY  2001   rupture L4-L5  . VASECTOMY      There were no vitals filed for this visit.  Subjective Assessment - 01/20/18 0848    Subjective  Went to [place] (7 hour car ride) on Saturday and forgot sling, was sore that night. Still sore in lateral posterior shoulder today. He didn't complete any exercises this weekend, just rested.     Pertinent History  LBP with  lumbar surgery; shoulder pain and dysfunction for two yrs prior to surgery     Currently in Pain?  Yes    Pain Score  2     Pain Location  Shoulder    Pain Orientation  Left    Pain Descriptors / Indicators  Dull    Aggravating Factors   reaching activity with LUE.    Pain Relieving Factors  rest in sling.       [facility] Adult PT Treatment/Exercise - 01/20/18 0001      Shoulder Exercises: Supine   Flexion  AAROM;Left;5 reps cane, elbows bent, within painfree range (40 deg)      Shoulder Exercises: Standing   External Rotation  PROM;Left;10 reps with cane per HEP    Extension  AAROM;10 reps;Left cane    Other Standing Exercises  pendulum x20 CW, CCW      Shoulder Exercises: Pulleys   Flexion  3 minutes tactile cues to relax L shoulder    Scaption  3 minutes      Shoulder Exercises: Isometric Strengthening   ABduction  -- 10 reps of 5 sec, to tolerance (deltoid)      Shoulder Exercises: Stretch   Internal Rotation Stretch  5 reps assist from R UE    Table Stretch - Flexion  5 reps    Table Stretch -Flexion Limitations  sitting with Lt side at table for table slide     Table Stretch - External Rotation  5 reps;10 seconds    Other Shoulder Stretches  prolonged snow angel 2-3 min shoulders at ~ 40 deg abd       Electrical Stimulation   Electrical Stimulation Location  Lt shoulder    Electrical Stimulation Action  IFC    Electrical Stimulation Parameters  to tolerance    Electrical Stimulation Goals  Pain      Vasopneumatic   Number Minutes Vasopneumatic   15 minutes    Vasopnuematic Location   Shoulder    Vasopneumatic Pressure  Low    Vasopneumatic Temperature   34 degrees         Vasopneumatic treatment was performed to decrease pain and prevent edema after passive range of motion and other exercises, as allowed per shoulder rehab protocol.         PT Education - 01/20/18 1103    Education provided  Yes    Education Details  pt advised to d/c use of sling per  protocol, except for periods of fatigue    Person(s) Educated  Patient    Methods  Explanation    Comprehension  Verbalized understanding       PT Short Term Goals - 01/17/18 0850      PT SHORT TERM GOAL #1   Title  Improve posture and alignment with patient to demonstrate upright posture with posterior shoulder girdle musculature engaged 02/17/18    Time  6    Period  Weeks    Status  On-going      PT SHORT TERM GOAL #2   Title  PROM to 150 deg elevation; 80-85 deg ER in scapular plane 02/17/18    Time  6    Period  Weeks    Status  On-going      PT SHORT TERM GOAL #3   Title  Progress with stabilization and AROM exercises per MD protocol 02/17/18    Time  6    Status  On-going      PT SHORT TERM GOAL #4   Title  Decrease pain to 0/10 at rest and no more than 2-4/10 with exercises and functional activities allowed by protocol 02/17/18    Time  6    Period  Weeks    Status  On-going        PT Long Term Goals - 01/17/18 0849      PT LONG TERM GOAL #1   Title  Full AROM Lt shoulder equal or greater that AROM Rt shoulder 03/31/18    Time  12    Period  Weeks    Status  On-going      PT LONG TERM GOAL #2   Title  4/5 to 4+/5 Lt shoulder strength 03/31/18    Time  12    Period  Weeks    Status  On-going      PT LONG TERM GOAL #3   Title  Progress with strengthening and functional activities allowing patient to return to normal activities as indicated 03/31/18    Time  12    Period  Weeks    Status  On-going      PT LONG TERM GOAL #4   Title  Independent in HEP 03/31/18    Time  12  Period  Weeks    Status  On-going      PT LONG TERM GOAL #5   Title  Improve FOTO to </= 36% limitation 03/31/18    Time  12    Period  Weeks    Status  On-going            Plan - 01/20/18 3016    Clinical Impression Statement  Pt has had continued progress with Lt shoulder ROM; added gentle AAROM exercises including pulleys and table ER, per protocol.  He tolerated all  exercises without increase in pain.      Rehab Potential  Good    PT Frequency  2x / week    PT Duration  12 weeks    PT Treatment/Interventions  Patient/family education;ADLs/Self Care Home Management;Cryotherapy;Electrical Stimulation;Iontophoresis 4mg /ml Dexamethasone;Moist Heat;Ultrasound;Dry needling;Manual techniques;Neuromuscular re-education;Therapeutic activities;Therapeutic exercise    PT Next Visit Plan  MD note (appt on Monday); continue progression per MD protocol.      Consulted and Agree with Plan of Care  Patient       Patient will benefit from skilled therapeutic intervention in order to improve the following deficits and impairments:  Postural dysfunction, Improper body mechanics, Pain, Increased fascial restricitons, Increased muscle spasms, Impaired UE functional use, Decreased mobility, Decreased range of motion, Decreased strength, Decreased activity tolerance  Visit Diagnosis: Acute pain of left shoulder  Abnormal posture  Other symptoms and signs involving the musculoskeletal system     Problem List Patient Active Problem List   Diagnosis Date Noted  . Diverticulitis large intestine 06/24/2014  . 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 History of migraine headaches 06/26/2012  . Hyperlipidemia 06/26/2012  . Ocular migraine 06/26/2012  . Essential tremor 06/26/2012  . Chronic insomnia 06/26/2012   Kerin Perna, PTA 01/20/18 11:18 AM  [place], [place], [address] Phone: [phone]   Fax:  [phone]  Name: Tim Gordon MRN: [id number] Date of Birth: Feb 06, 1961

## 2018-01-22 ENCOUNTER — Encounter: Payer: Self-pay | Admitting: Rehabilitative and Restorative Service Providers"

## 2018-01-22 ENCOUNTER — Ambulatory Visit (INDEPENDENT_AMBULATORY_CARE_PROVIDER_SITE_OTHER): Payer: BLUE CROSS/BLUE SHIELD | Admitting: Rehabilitative and Restorative Service Providers"

## 2018-01-22 DIAGNOSIS — M25512 Pain in left shoulder: Secondary | ICD-10-CM

## 2018-01-22 DIAGNOSIS — R293 Abnormal posture: Secondary | ICD-10-CM | POA: Diagnosis not present

## 2018-01-22 DIAGNOSIS — R29898 Other symptoms and signs involving the musculoskeletal system: Secondary | ICD-10-CM

## 2018-01-22 DIAGNOSIS — R600 Localized edema: Secondary | ICD-10-CM

## 2018-01-22 NOTE — Therapy (Signed)
[place] [place], [place], [address] Phone: [phone]   Fax:  [phone]  Physical Therapy Treatment  Patient Details  Name: Tim Gordon MRN: [id number] Date of Birth: August 27, 1961 Referring Provider: Dr Susa Day   Encounter Date: 01/22/2018  PT End of Session - 01/22/18 0855    Visit Number  6    Number of Visits  24    Date for PT Re-Evaluation  03/31/18    PT Start Time  0849    PT Stop Time  0949    PT Time Calculation (min)  60 min    Activity Tolerance  Patient tolerated treatment well       Past Medical History:  Diagnosis Date  . Allergy   . Blood in stool   . Diverticulitis   . GERD (gastroesophageal reflux disease)    CONTROLLED WITH DIET - NO MEDS  . Heart murmur    MVP - NEVER CAUSES ANY PROBLEMS; PT VERY ACTIVE - CAN PLAY RACKETBALL FOR COUPLE OF HOURS AND NEVER ANY CHEST C/O  . History of kidney stones   . Hyperlipidemia   . Migraines    OCULAR MIGRAINES  . Sleep apnea    STATES SLEEP STUDY 4 OR 5 YEARS AGO - TOLD BORDERLINE - TRIED CPAP FOR 30 DAYS - STATES IT DID NOT HELP - NO LONGER USES.    Past Surgical History:  Procedure Laterality Date  . APPENDECTOMY    . COLON SURGERY    . LAPAROSCOPIC PARTIAL COLECTOMY N/A 06/24/2014   Procedure: LAPAROSCOPIC ASSISTED  PARTIAL COLECTOMY;  Surgeon: Jackolyn Confer, MD;  Location: [facility];  Service: General;  Laterality: N/A;  . NASAL RECONSTRUCTION WITH SEPTAL REPAIR    . SPINE SURGERY  2001   rupture L4-L5  . VASECTOMY      There were no vitals filed for this visit.  Subjective Assessment - 01/22/18 0903    Subjective  Doing well - weaned form sling and continues to work on exercises at home. Some difficulty sleeping with LBP.     Currently in Pain?  No/denies         [facility] PT Assessment - 01/22/18 0001      Assessment   Medical Diagnosis  Lt RCR    Referring Provider  Dr Susa Day    Onset Date/Surgical Date   12/20/17    Hand Dominance  Right    Next MD Visit  01/27/18    Prior Therapy  none       PROM   PROM Assessment Site  Shoulder    Right/Left Shoulder  Left    Left Shoulder Extension  64 Degrees    Left Shoulder Flexion  153 Degrees    Left Shoulder ABduction  160 Degrees    Left Shoulder Internal Rotation  54 Degrees    Left Shoulder External Rotation  75 Degrees      Palpation   Palpation comment  improving muscular tightness Lt > Rt pecs; upper trap; leveator; teres; posterior/lateral cervical musculature                    [facility] Adult PT Treatment/Exercise - 01/22/18 0001      Shoulder Exercises: Supine   Flexion  AAROM;Left;5 reps cane, elbows bent, within painfree range (40 deg)      Shoulder Exercises: Standing   External Rotation  PROM;Left;10 reps with cane per HEP    Extension  AAROM;10 reps;Left cane  Other Standing Exercises  pendulum x20 CW, CCW      Shoulder Exercises: Pulleys   Flexion  2 minutes verbal and tactile cues for position/relax shoulder girdle     Scaption  2 minutes verbal and tactile cues for position/relax shoulder girdle      Shoulder Exercises: Isometric Strengthening   ABduction  -- 10 reps of 5 sec, to tolerance (deltoid)      Shoulder Exercises: Stretch   Other Shoulder Stretches  prolonged snow angel 2-3 min shoulders at ~ 40 deg abd       Cryotherapy   Number Minutes Cryotherapy  20 Minutes    Cryotherapy Location  Shoulder Lt    Type of Cryotherapy  Ice pack      Electrical Stimulation   Electrical Stimulation Location  Lt shoulder    Electrical Stimulation Action  IFC    Electrical Stimulation Parameters  to tolerance    Electrical Stimulation Goals  Pain;Tone      Manual Therapy   Manual therapy comments  pt supine     Joint Mobilization  gentle GH motion     Soft tissue mobilization  deep tissue work through the posterior lateral cervical musculature into the Lt scapular musculature     Myofascial Release  Lt  biceps/anterior deltoid    Scapular Mobilization  Lt     Passive ROM  Lt shoulder flexion; ER/IR in scapular plane; extension                PT Short Term Goals - 01/17/18 0850      PT SHORT TERM GOAL #1   Title  Improve posture and alignment with patient to demonstrate upright posture with posterior shoulder girdle musculature engaged 02/17/18    Time  6    Period  Weeks    Status  On-going      PT SHORT TERM GOAL #2   Title  PROM to 150 deg elevation; 80-85 deg ER in scapular plane 02/17/18    Time  6    Period  Weeks    Status  On-going      PT SHORT TERM GOAL #3   Title  Progress with stabilization and AROM exercises per MD protocol 02/17/18    Time  6    Status  On-going      PT SHORT TERM GOAL #4   Title  Decrease pain to 0/10 at rest and no more than 2-4/10 with exercises and functional activities allowed by protocol 02/17/18    Time  6    Period  Weeks    Status  On-going        PT Long Term Goals - 01/17/18 0849      PT LONG TERM GOAL #1   Title  Full AROM Lt shoulder equal or greater that AROM Rt shoulder 03/31/18    Time  12    Period  Weeks    Status  On-going      PT LONG TERM GOAL #2   Title  4/5 to 4+/5 Lt shoulder strength 03/31/18    Time  12    Period  Weeks    Status  On-going      PT LONG TERM GOAL #3   Title  Progress with strengthening and functional activities allowing patient to return to normal activities as indicated 03/31/18    Time  12    Period  Weeks    Status  On-going      PT LONG  TERM GOAL #4   Title  Independent in HEP 03/31/18    Time  12    Period  Weeks    Status  On-going      PT LONG TERM GOAL #5   Title  Improve FOTO to </= 36% limitation 03/31/18    Time  12    Period  Weeks    Status  On-going            Plan - 01/22/18 0901    Clinical Impression Statement  Progressing with PROM with pully and with PT assisting with PROM/stretching. Patient has weaned from the sling and is doing well. Progressing per  post op protocol.    Rehab Potential  Good    PT Frequency  2x / week    PT Duration  12 weeks    PT Treatment/Interventions  Patient/family education;ADLs/Self Care Home Management;Cryotherapy;Electrical Stimulation;Iontophoresis 4mg /ml Dexamethasone;Moist Heat;Ultrasound;Dry needling;Manual techniques;Neuromuscular re-education;Therapeutic activities;Therapeutic exercise    PT Next Visit Plan  MD note continue progression per MD protocol.      Consulted and Agree with Plan of Care  Patient       Patient will benefit from skilled therapeutic intervention in order to improve the following deficits and impairments:  Postural dysfunction, Improper body mechanics, Pain, Increased fascial restricitons, Increased muscle spasms, Impaired UE functional use, Decreased mobility, Decreased range of motion, Decreased strength, Decreased activity tolerance  Visit Diagnosis: Acute pain of left shoulder  Abnormal posture  Other symptoms and signs involving the musculoskeletal system     Problem List Patient Active Problem List   Diagnosis Date Noted  . Diverticulitis large intestine 06/24/2014  . 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 History of migraine headaches 06/26/2012  . Hyperlipidemia 06/26/2012  . Ocular migraine 06/26/2012  . Essential tremor 06/26/2012  . Chronic insomnia 06/26/2012    Pleas Carneal Nilda Simmer PT, MPH  01/22/2018, 12:36 PM  [facility] [place], [place], [address] Phone: [phone]   Fax:  [phone]  Name: RAYMONE PEMBROKE MRN: [id number] Date of Birth: 1960-10-24

## 2018-01-28 ENCOUNTER — Encounter: Payer: Self-pay | Admitting: Physical Therapy

## 2018-01-28 ENCOUNTER — Ambulatory Visit (INDEPENDENT_AMBULATORY_CARE_PROVIDER_SITE_OTHER): Payer: BLUE CROSS/BLUE SHIELD | Admitting: Physical Therapy

## 2018-01-28 DIAGNOSIS — R29898 Other symptoms and signs involving the musculoskeletal system: Secondary | ICD-10-CM

## 2018-01-28 DIAGNOSIS — M25512 Pain in left shoulder: Secondary | ICD-10-CM

## 2018-01-28 DIAGNOSIS — R600 Localized edema: Secondary | ICD-10-CM

## 2018-01-28 DIAGNOSIS — R293 Abnormal posture: Secondary | ICD-10-CM

## 2018-01-28 NOTE — Therapy (Signed)
[place], [place], [address] Phone: [phone]   Fax:  [phone]  Physical Therapy Treatment  Patient Details  Name: Tim Gordon MRN: [id number] Date of Birth: May 30, 1961 Referring Provider: Dr. Susa Day   Encounter Date: 01/28/2018  PT End of Session - 01/28/18 1151    Visit Number  7    Number of Visits  24    Date for PT Re-Evaluation  03/31/18    PT Start Time  1150    PT Stop Time  1245    PT Time Calculation (min)  55 min    Activity Tolerance  Patient tolerated treatment well    Behavior During Therapy  [facility] for tasks assessed/performed       Past Medical History:  Diagnosis Date  . Allergy   . Blood in stool   . Diverticulitis   . GERD (gastroesophageal reflux disease)    CONTROLLED WITH DIET - NO MEDS  . Heart murmur    MVP - NEVER CAUSES ANY PROBLEMS; PT VERY ACTIVE - CAN PLAY RACKETBALL FOR COUPLE OF HOURS AND NEVER ANY CHEST C/O  . History of kidney stones   . Hyperlipidemia   . Migraines    OCULAR MIGRAINES  . Sleep apnea    STATES SLEEP STUDY 4 OR 5 YEARS AGO - TOLD BORDERLINE - TRIED CPAP FOR 30 DAYS - STATES IT DID NOT HELP - NO LONGER USES.    Past Surgical History:  Procedure Laterality Date  . APPENDECTOMY    . COLON SURGERY    . LAPAROSCOPIC PARTIAL COLECTOMY N/A 06/24/2014   Procedure: LAPAROSCOPIC ASSISTED  PARTIAL COLECTOMY;  Surgeon: Jackolyn Confer, MD;  Location: [facility];  Service: General;  Laterality: N/A;  . NASAL RECONSTRUCTION WITH SEPTAL REPAIR    . SPINE SURGERY  2001   rupture L4-L5  . VASECTOMY      There were no vitals filed for this visit.  Subjective Assessment - 01/28/18 1152    Subjective  Tom received his pulley on Friday. He saw his doctor yesterday, who has given him the go-ahead for AAROM exercises and a new protocol sheet, which he will bring with him next visit.   Pertinent History  LBP with lumbar surgery; shoulder pain and  dysfunction for two yrs prior to surgery     Diagnostic tests  MRI     Patient Stated Goals  restore use of Lt arm     Currently in Pain?  No/denies         [facility] PT Assessment - 01/28/18 0001      Assessment   Medical Diagnosis  Lt RCR    Referring Provider  Dr. Susa Day    Onset Date/Surgical Date  12/20/17    Hand Dominance  Right    Next MD Visit  03/07/18    Prior Therapy  none       PROM   Left Shoulder Flexion  153 Degrees    Left Shoulder ABduction  160 Degrees    Left Shoulder External Rotation  74 Degrees       [facility] Adult PT Treatment/Exercise - 01/28/18 0001      Shoulder Exercises: Standing   External Rotation  PROM;Left;10 reps with cane per HEP    Extension  AAROM;10 reps;Left cane      Shoulder Exercises: Pulleys   Flexion  3 minutes    Scaption  3 minutes VCs to avoid leaning  Shoulder Exercises: Isometric Strengthening   Flexion  -- 10 reps of 5 sec, to tolerance    Extension  -- 10 reps of 5 sec, standing, to tolerance    ABduction  -- 10 reps of 5 sec, to tolerance (deltoid)      Manual Therapy   Manual therapy comments  pt supine     Soft tissue mobilization  deep tissue work through the posterior lateral cervical musculature into the Lt scapular musculature     Passive ROM  Lt shoulder flexion, scaption; ER in scapular plane         PT Short Term Goals - 01/28/18 1338      PT SHORT TERM GOAL #1   Title  Improve posture and alignment with patient to demonstrate upright posture with posterior shoulder girdle musculature engaged 02/17/18    Time  6    Period  Weeks    Status  On-going      PT SHORT TERM GOAL #2   Title  PROM to 150 deg elevation; 80-85 deg ER in scapular plane 02/17/18    Time  6    Period  Weeks    Status  Partially Met      PT SHORT TERM GOAL #3   Title  Progress with stabilization and AROM exercises per MD protocol 02/17/18    Time  6    Period  Weeks    Status  On-going      PT SHORT TERM GOAL #4   Title   Decrease pain to 0/10 at rest and no more than 2-4/10 with exercises and functional activities allowed by protocol 02/17/18    Time  6    Period  Weeks    Status  On-going        PT Long Term Goals - 01/28/18 1313      PT LONG TERM GOAL #1   Title  Full AROM Lt shoulder equal or greater that AROM Rt shoulder 03/31/18    Time  12    Period  Weeks    Status  On-going      PT LONG TERM GOAL #2   Title  4/5 to 4+/5 Lt shoulder strength 03/31/18    Time  12    Period  Weeks    Status  On-going      PT LONG TERM GOAL #3   Title  Progress with strengthening and functional activities allowing patient to return to normal activities as indicated 03/31/18    Time  12    Period  Weeks    Status  On-going      PT LONG TERM GOAL #4   Title  Independent in HEP 03/31/18    Time  12    Period  Weeks    Status  On-going      PT LONG TERM GOAL #5   Title  Improve FOTO to </= 36% limitation 03/31/18    Time  12    Period  Weeks    Status  On-going            Plan - 01/28/18 1322    Clinical Impression Statement  Gershon Mussel is progressing with AAROM and isometric exercises per surgical protocol. He tolerated passive stretches well and has partially met STG#2.    Rehab Potential  Good    PT Frequency  2x / week    PT Duration  12 weeks    PT Treatment/Interventions  Patient/family education;ADLs/Self Care Home Management;Cryotherapy;Electrical Stimulation;Iontophoresis 35m/ml  Dexamethasone;Moist Heat;Ultrasound;Dry needling;Manual techniques;Neuromuscular re-education;Therapeutic activities;Therapeutic exercise    PT Next Visit Plan  Continue progression per protocol, evaluate possible new protocol from surgeon.    Consulted and Agree with Plan of Care  Patient       Patient will benefit from skilled therapeutic intervention in order to improve the following deficits and impairments:  Postural dysfunction, Improper body mechanics, Pain, Increased fascial restricitons, Increased muscle spasms,  Impaired UE functional use, Decreased mobility, Decreased range of motion, Decreased strength, Decreased activity tolerance  Visit Diagnosis: Acute pain of left shoulder  Abnormal posture  Other symptoms and signs involving the musculoskeletal system     Problem List Patient Active Problem List   Diagnosis Date Noted  . Diverticulitis large intestine 06/24/2014  . 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 History of migraine headaches 06/26/2012  . Hyperlipidemia 06/26/2012  . Ocular migraine 06/26/2012  . Essential tremor 06/26/2012  . Chronic insomnia 06/26/2012    Scarlett Presto, SPTA 01/28/2018, 1:39 PM  This entire session was performed under direct supervision and direction of a licensed physical therapist assistant. I have personally read, edited and approve of the note as written.  Kerin Perna, PTA 01/28/18 1:39 PM  [facility] [place], [place], [address] Phone: [phone]   Fax:  [phone]  Name: LUDWIG TUGWELL MRN: [id number] Date of Birth: 12-17-1960

## 2018-01-31 ENCOUNTER — Encounter: Payer: Self-pay | Admitting: Rehabilitative and Restorative Service Providers"

## 2018-01-31 ENCOUNTER — Ambulatory Visit (INDEPENDENT_AMBULATORY_CARE_PROVIDER_SITE_OTHER): Payer: BLUE CROSS/BLUE SHIELD | Admitting: Rehabilitative and Restorative Service Providers"

## 2018-01-31 DIAGNOSIS — R600 Localized edema: Secondary | ICD-10-CM

## 2018-01-31 DIAGNOSIS — R293 Abnormal posture: Secondary | ICD-10-CM | POA: Diagnosis not present

## 2018-01-31 DIAGNOSIS — R29898 Other symptoms and signs involving the musculoskeletal system: Secondary | ICD-10-CM | POA: Diagnosis not present

## 2018-01-31 DIAGNOSIS — M25512 Pain in left shoulder: Secondary | ICD-10-CM | POA: Diagnosis not present

## 2018-01-31 NOTE — Patient Instructions (Addendum)
Thoracic Lift    Press shoulders down. Then lift mid-thoracic spine (area between the shoulder blades). Lift the breastbone slightly. Hold __10_ seconds. Relax. Repeat __10_ times.  Lying on back - use right arm to assist in moving Left arm up overhead  Hold 5-10 sec and lower slowly 5-10 reps 1 x/day   Neurovascular: Median Nerve Glide With Cervical Bias - Supine    Lie with neck supported, right arm out to side, elbow straight, thumb down, fingers and wrist bent back. Slowly move opposite side ear toward shoulder  Do __60__ sets per session. Do _2 reps 1-2 times/day

## 2018-01-31 NOTE — Therapy (Signed)
[place], [place], [address] Phone: [phone]   Fax:  [phone]  Physical Therapy Treatment  Patient Details  Name: Tim Gordon MRN: [id number] Date of Birth: 08/16/1961 Referring Provider: Dr. Susa Day   Encounter Date: 01/31/2018  PT End of Session - 01/31/18 0843    Visit Number  8    Number of Visits  24    Date for PT Re-Evaluation  03/31/18    PT Start Time  0844    PT Stop Time  0948    PT Time Calculation (min)  64 min    Activity Tolerance  Patient tolerated treatment well       Past Medical History:  Diagnosis Date  . Allergy   . Blood in stool   . Diverticulitis   . GERD (gastroesophageal reflux disease)    CONTROLLED WITH DIET - NO MEDS  . Heart murmur    MVP - NEVER CAUSES ANY PROBLEMS; PT VERY ACTIVE - CAN PLAY RACKETBALL FOR COUPLE OF HOURS AND NEVER ANY CHEST C/O  . History of kidney stones   . Hyperlipidemia   . Migraines    OCULAR MIGRAINES  . Sleep apnea    STATES SLEEP STUDY 4 OR 5 YEARS AGO - TOLD BORDERLINE - TRIED CPAP FOR 30 DAYS - STATES IT DID NOT HELP - NO LONGER USES.    Past Surgical History:  Procedure Laterality Date  . APPENDECTOMY    . COLON SURGERY    . LAPAROSCOPIC PARTIAL COLECTOMY N/A 06/24/2014   Procedure: LAPAROSCOPIC ASSISTED  PARTIAL COLECTOMY;  Surgeon: Jackolyn Confer, MD;  Location: [facility];  Service: General;  Laterality: N/A;  . NASAL RECONSTRUCTION WITH SEPTAL REPAIR    . SPINE SURGERY  2001   rupture L4-L5  . VASECTOMY      There were no vitals filed for this visit.  Subjective Assessment - 01/31/18 0900    Subjective  Using pulley at home. Can feel the stretch and some tingling into the Lt UE at times - likely related to prior neck problems/injuries                        [facility] Adult PT Treatment/Exercise - 01/31/18 0001      Shoulder Exercises: Supine   Flexion  AAROM;Left;5 reps using Rt UE to  support Lt for ROM to pt tolerance    Other Supine Exercises  thoracic lift 10 x 10       Shoulder Exercises: Pulleys   Flexion  3 minutes focus on keeping shd down/10 sec hold last 5 reps     Scaption  -- 10 sec hold x 5       Shoulder Exercises: Stretch   Other Shoulder Stretches  prolonged snow angel 1-60mn shoulders at ~ 80 deg abd       Moist Heat Therapy   Number Minutes Moist Heat  20 Minutes    Moist Heat Location  Shoulder Lt posterior shoudler girdle       Cryotherapy   Number Minutes Cryotherapy  20 Minutes    Cryotherapy Location  Shoulder anterior     Type of Cryotherapy  Ice pack      Electrical Stimulation   Electrical Stimulation Location  Lt shoulder    Electrical Stimulation Action  IFC    Electrical Stimulation Parameters  to tolerance    Electrical Stimulation Goals  Pain;Tone      Manual  Therapy   Manual therapy comments  pt supine     Joint Mobilization  gentle GH motion     Soft tissue mobilization  deep tissue work through the posterior lateral cervical musculature into the Lt scapular musculature focus on supraspinatus/trap also working through the pecs/anterior deltoid/biceps     Myofascial Release  Lt biceps/anterior deltoid    Scapular Mobilization  Lt     Passive ROM  Lt shoulder flexion, scaption; ER in scapular plane; extension; extension with elbow extension; IR in scapular plane blocking anterior shoulder translation              PT Education - 01/31/18 0932    Education provided  Yes    Education Details  HEP     Person(s) Educated  Patient    Methods  Explanation;Demonstration;Tactile cues;Verbal cues;Handout    Comprehension  Verbalized understanding;Returned demonstration;Verbal cues required;Tactile cues required       PT Short Term Goals - 01/28/18 1338      PT SHORT TERM GOAL #1   Title  Improve posture and alignment with patient to demonstrate upright posture with posterior shoulder girdle musculature engaged 02/17/18     Time  6    Period  Weeks    Status  On-going      PT SHORT TERM GOAL #2   Title  PROM to 150 deg elevation; 80-85 deg ER in scapular plane 02/17/18    Time  6    Period  Weeks    Status  Partially Met      PT SHORT TERM GOAL #3   Title  Progress with stabilization and AROM exercises per MD protocol 02/17/18    Time  6    Period  Weeks    Status  On-going      PT SHORT TERM GOAL #4   Title  Decrease pain to 0/10 at rest and no more than 2-4/10 with exercises and functional activities allowed by protocol 02/17/18    Time  6    Period  Weeks    Status  On-going        PT Long Term Goals - 01/28/18 1313      PT LONG TERM GOAL #1   Title  Full AROM Lt shoulder equal or greater that AROM Rt shoulder 03/31/18    Time  12    Period  Weeks    Status  On-going      PT LONG TERM GOAL #2   Title  4/5 to 4+/5 Lt shoulder strength 03/31/18    Time  12    Period  Weeks    Status  On-going      PT LONG TERM GOAL #3   Title  Progress with strengthening and functional activities allowing patient to return to normal activities as indicated 03/31/18    Time  12    Period  Weeks    Status  On-going      PT LONG TERM GOAL #4   Title  Independent in HEP 03/31/18    Time  12    Period  Weeks    Status  On-going      PT LONG TERM GOAL #5   Title  Improve FOTO to </= 36% limitation 03/31/18    Time  12    Period  Weeks    Status  On-going            Plan - 01/31/18 1205    Clinical Impression Statement  Improving  PROM and increased tissue extensibility noted with manual work. Patient is progressing well with shoulder rehab per protocol. Added AAROM for Lt shoulder flexion in supine without difficulty. He has continued muscular tightness and shortening through Lt pecs; teres; supraspinatus; cervical musculature. MD order - continue AROM/AAROM x 4 weeks then begin strengthening - 02/24/18.    Rehab Potential  Good    PT Frequency  2x / week    PT Duration  12 weeks    PT  Treatment/Interventions  Patient/family education;ADLs/Self Care Home Management;Cryotherapy;Electrical Stimulation;Iontophoresis 52m/ml Dexamethasone;Moist Heat;Ultrasound;Dry needling;Manual techniques;Neuromuscular re-education;Therapeutic activities;Therapeutic exercise    PT Next Visit Plan  Continue progression per protocol; manual work through LSeven Cornersshoulder girdle and cervical spine musculature       Patient will benefit from skilled therapeutic intervention in order to improve the following deficits and impairments:  Postural dysfunction, Improper body mechanics, Pain, Increased fascial restricitons, Increased muscle spasms, Impaired UE functional use, Decreased mobility, Decreased range of motion, Decreased strength, Decreased activity tolerance  Visit Diagnosis: Acute pain of left shoulder  Abnormal posture  Other symptoms and signs involving the musculoskeletal system     Problem List Patient Active Problem List   Diagnosis Date Noted  . Diverticulitis large intestine 06/24/2014  . 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 History of migraine headaches 06/26/2012  . Hyperlipidemia 06/26/2012  . Ocular migraine 06/26/2012  . Essential tremor 06/26/2012  . Chronic insomnia 06/26/2012    Ramsay Bognar PNilda SimmerPT, MPH  01/31/2018, 12:19 PM  [facility] [address]Phone: [phone]  Fax:  [phone] Name: Tim FAWAZMRN: [id number] of Birth: 3May 06, 1962

## 2018-02-03 ENCOUNTER — Encounter: Payer: Self-pay | Admitting: Rehabilitative and Restorative Service Providers"

## 2018-02-03 ENCOUNTER — Ambulatory Visit (INDEPENDENT_AMBULATORY_CARE_PROVIDER_SITE_OTHER): Payer: BLUE CROSS/BLUE SHIELD | Admitting: Rehabilitative and Restorative Service Providers"

## 2018-02-03 DIAGNOSIS — R293 Abnormal posture: Secondary | ICD-10-CM | POA: Diagnosis not present

## 2018-02-03 DIAGNOSIS — R29898 Other symptoms and signs involving the musculoskeletal system: Secondary | ICD-10-CM

## 2018-02-03 DIAGNOSIS — M25512 Pain in left shoulder: Secondary | ICD-10-CM | POA: Diagnosis not present

## 2018-02-03 DIAGNOSIS — R600 Localized edema: Secondary | ICD-10-CM

## 2018-02-03 NOTE — Therapy (Signed)
[place], [place], [address] Phone: [phone]   Fax:  [phone]  Physical Therapy Treatment  Patient Details  Name: Tim Gordon MRN: [id number] Date of Birth: 1961/07/24 Referring Provider: Dr Susa Day   Encounter Date: 02/03/2018  PT End of Session - 02/03/18 0725    Visit Number  9    Number of Visits  24    Date for PT Re-Evaluation  03/31/18    PT Start Time  0714    PT Stop Time  0812    PT Time Calculation (min)  58 min    Activity Tolerance  Patient tolerated treatment well       Past Medical History:  Diagnosis Date  . Allergy   . Blood in stool   . Diverticulitis   . GERD (gastroesophageal reflux disease)    CONTROLLED WITH DIET - NO MEDS  . Heart murmur    MVP - NEVER CAUSES ANY PROBLEMS; PT VERY ACTIVE - CAN PLAY RACKETBALL FOR COUPLE OF HOURS AND NEVER ANY CHEST C/O  . History of kidney stones   . Hyperlipidemia   . Migraines    OCULAR MIGRAINES  . Sleep apnea    STATES SLEEP STUDY 4 OR 5 YEARS AGO - TOLD BORDERLINE - TRIED CPAP FOR 30 DAYS - STATES IT DID NOT HELP - NO LONGER USES.    Past Surgical History:  Procedure Laterality Date  . APPENDECTOMY    . COLON SURGERY    . LAPAROSCOPIC PARTIAL COLECTOMY N/A 06/24/2014   Procedure: LAPAROSCOPIC ASSISTED  PARTIAL COLECTOMY;  Surgeon: Jackolyn Confer, MD;  Location: [facility];  Service: General;  Laterality: N/A;  . NASAL RECONSTRUCTION WITH SEPTAL REPAIR    . SPINE SURGERY  2001   rupture L4-L5  . VASECTOMY      There were no vitals filed for this visit.  Subjective Assessment - 02/03/18 0723    Subjective  Doing well. No pain. Working on his exercises at home.     Patient is accompained by:  Family member    Currently in Pain?  No/denies         [facility] PT Assessment - 02/03/18 0001      Assessment   Medical Diagnosis  Lt RCR    Referring Provider  Dr Susa Day    Onset Date/Surgical Date  12/20/17     Hand Dominance  Right    Next MD Visit  03/07/18    Prior Therapy  none       PROM   Left Shoulder Flexion  155 Degrees    Left Shoulder ABduction  161 Degrees    Left Shoulder External Rotation  80 Degrees      Palpation   Palpation comment  improving muscular tightness Lt > Rt pecs; upper trap; leveator; teres; posterior/lateral cervical musculature                    [facility] Adult PT Treatment/Exercise - 02/03/18 0001      Shoulder Exercises: Supine   Other Supine Exercises  thoracic lift 10 x 10       Shoulder Exercises: Standing   External Rotation  PROM;Left;10 reps    Extension  AAROM;10 reps;Left      Shoulder Exercises: Pulleys   Flexion  3 minutes focus on keeping shd down/10 sec hold last 5 reps     Scaption  2 minutes      Shoulder Exercises: Isometric  Strengthening   Flexion  5X5"    Extension  5X5"    ABduction  5X5"      Shoulder Exercises: Stretch   Other Shoulder Stretches  prolonged snow angel 1-55mn shoulders at ~ 80 deg abd       Moist Heat Therapy   Number Minutes Moist Heat  20 Minutes    Moist Heat Location  Shoulder Lt posterior shoudler girdle       Cryotherapy   Number Minutes Cryotherapy  20 Minutes    Cryotherapy Location  Shoulder anterior     Type of Cryotherapy  Ice pack      Electrical Stimulation   Electrical Stimulation Location  Lt shoulder    Electrical Stimulation Action  IFC    Electrical Stimulation Parameters  to tolerance    Electrical Stimulation Goals  Pain;Tone      Manual Therapy   Manual therapy comments  pt supine     Joint Mobilization  gentle GH motion     Soft tissue mobilization  deep tissue work through the posterior lateral cervical musculature into the Lt scapular musculature focus on supraspinatus/trap also working through the pecs/anterior deltoid/biceps     Myofascial Release  Lt biceps/anterior deltoid    Scapular Mobilization  Lt     Passive ROM  Lt shoulder flexion, scaption; ER in scapular  plane; extension; extension with elbow extension; IR in scapular plane blocking anterior shoulder translation     Other Manual Therapy  scar massage port sites Lt shoulder              PT Education - 02/03/18 0804    Education provided  Yes    Education Details  scar management     Person(s) Educated  Patient    Methods  Explanation    Comprehension  Verbalized understanding       PT Short Term Goals - 01/28/18 1338      PT SHORT TERM GOAL #1   Title  Improve posture and alignment with patient to demonstrate upright posture with posterior shoulder girdle musculature engaged 02/17/18    Time  6    Period  Weeks    Status  On-going      PT SHORT TERM GOAL #2   Title  PROM to 150 deg elevation; 80-85 deg ER in scapular plane 02/17/18    Time  6    Period  Weeks    Status  Partially Met      PT SHORT TERM GOAL #3   Title  Progress with stabilization and AROM exercises per MD protocol 02/17/18    Time  6    Period  Weeks    Status  On-going      PT SHORT TERM GOAL #4   Title  Decrease pain to 0/10 at rest and no more than 2-4/10 with exercises and functional activities allowed by protocol 02/17/18    Time  6    Period  Weeks    Status  On-going        PT Long Term Goals - 02/03/18 0724      PT LONG TERM GOAL #1   Title  Full AROM Lt shoulder equal or greater that AROM Rt shoulder 03/31/18    Time  12    Period  Weeks    Status  On-going      PT LONG TERM GOAL #2   Title  4/5 to 4+/5 Lt shoulder strength 03/31/18    Time  12    Period  Weeks    Status  On-going      PT LONG TERM GOAL #3   Title  Progress with strengthening and functional activities allowing patient to return to normal activities as indicated 03/31/18    Time  12    Period  Weeks    Status  On-going      PT LONG TERM GOAL #4   Title  Independent in HEP 03/31/18    Time  12    Period  Weeks    Status  On-going      PT LONG TERM GOAL #5   Title  Improve FOTO to </= 36% limitation 03/31/18     Time  12    Period  Weeks    Status  On-going            Plan - 02/03/18 0804    Clinical Impression Statement  Cotninued imporovement in PROM - no pain. Progressing well with shoulder rehab. Added scar massage today without difficulty.     Rehab Potential  Good    PT Frequency  2x / week    PT Duration  12 weeks    PT Treatment/Interventions  Patient/family education;ADLs/Self Care Home Management;Cryotherapy;Electrical Stimulation;Iontophoresis 10m/ml Dexamethasone;Moist Heat;Ultrasound;Dry needling;Manual techniques;Neuromuscular re-education;Therapeutic activities;Therapeutic exercise    PT Next Visit Plan  Continue progression per protocol; manual work through LSanta Barbarashoulder girdle and cervical spine musculature    Consulted and Agree with Plan of Care  Patient       Patient will benefit from skilled therapeutic intervention in order to improve the following deficits and impairments:  Postural dysfunction, Improper body mechanics, Pain, Increased fascial restricitons, Increased muscle spasms, Impaired UE functional use, Decreased mobility, Decreased range of motion, Decreased strength, Decreased activity tolerance  Visit Diagnosis: Acute pain of left shoulder  Abnormal posture  Other symptoms and signs involving the musculoskeletal system     Problem List Patient Active Problem List   Diagnosis Date Noted  . Diverticulitis large intestine 06/24/2014  . 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 History of migraine headaches 06/26/2012  . Hyperlipidemia 06/26/2012  . Ocular migraine 06/26/2012  . Essential tremor 06/26/2012  . Chronic insomnia 06/26/2012    Tasha Jindra PNilda SimmerPT, MPH  02/03/2018, 8:06 AM  [facility] [address]Phone: [phone]  Fax:  [phone] Name: Tim KINZLERMRN: [id number] of Birth: [birth year]

## 2018-02-03 NOTE — Patient Instructions (Signed)
   [facility] Silicone Gel Sheet

## 2018-02-05 ENCOUNTER — Ambulatory Visit (INDEPENDENT_AMBULATORY_CARE_PROVIDER_SITE_OTHER): Payer: BLUE CROSS/BLUE SHIELD | Admitting: Physical Therapy

## 2018-02-05 ENCOUNTER — Encounter: Payer: Self-pay | Admitting: Physical Therapy

## 2018-02-05 DIAGNOSIS — M25512 Pain in left shoulder: Secondary | ICD-10-CM | POA: Diagnosis not present

## 2018-02-05 DIAGNOSIS — R293 Abnormal posture: Secondary | ICD-10-CM | POA: Diagnosis not present

## 2018-02-05 DIAGNOSIS — R600 Localized edema: Secondary | ICD-10-CM

## 2018-02-05 DIAGNOSIS — R29898 Other symptoms and signs involving the musculoskeletal system: Secondary | ICD-10-CM | POA: Diagnosis not present

## 2018-02-05 NOTE — Addendum Note (Signed)
Addended by: Grayden Burley E on: 02/05/2018 03:20 PM   Modules accepted: Orders

## 2018-02-05 NOTE — Therapy (Signed)
[facility] [place], [place], [address] Phone: [phone]   Fax:  [phone]  Physical Therapy Treatment  Patient Details  Name: Tim Gordon MRN: [id number] Date of Birth: Jul 31, 1961 Referring Provider: Dr. Susa Day   Encounter Date: 02/05/2018  PT End of Session - 02/05/18 0806    Visit Number  10    Number of Visits  24    Date for PT Re-Evaluation  03/31/18    PT Start Time  6962    PT Stop Time  0817    PT Time Calculation (min)  60 min       Past Medical History:  Diagnosis Date  . Allergy   . Blood in stool   . Diverticulitis   . GERD (gastroesophageal reflux disease)    CONTROLLED WITH DIET - NO MEDS  . Heart murmur    MVP - NEVER CAUSES ANY PROBLEMS; PT VERY ACTIVE - CAN PLAY RACKETBALL FOR COUPLE OF HOURS AND NEVER ANY CHEST C/O  . History of kidney stones   . Hyperlipidemia   . Migraines    OCULAR MIGRAINES  . Sleep apnea    STATES SLEEP STUDY 4 OR 5 YEARS AGO - TOLD BORDERLINE - TRIED CPAP FOR 30 DAYS - STATES IT DID NOT HELP - NO LONGER USES.    Past Surgical History:  Procedure Laterality Date  . APPENDECTOMY    . COLON SURGERY    . LAPAROSCOPIC PARTIAL COLECTOMY N/A 06/24/2014   Procedure: LAPAROSCOPIC ASSISTED  PARTIAL COLECTOMY;  Surgeon: Jackolyn Confer, MD;  Location: [facility];  Service: General;  Laterality: N/A;  . NASAL RECONSTRUCTION WITH SEPTAL REPAIR    . SPINE SURGERY  2001   rupture L4-L5  . VASECTOMY      There were no vitals filed for this visit.  Subjective Assessment - 02/05/18 1300    Subjective  Pt reports no new changes since last visit.     Patient Stated Goals  restore use of Lt arm     Currently in Pain?  No/denies    Pain Score  0-No pain         [facility] PT Assessment - 02/05/18 0001      Assessment   Medical Diagnosis  Lt RCR    Referring Provider  Dr. Susa Day    Onset Date/Surgical Date  12/20/17    Hand Dominance  Right    Next  MD Visit  03/07/18       [facility] Adult PT Treatment/Exercise - 02/05/18 0001      Shoulder Exercises: Supine   Other Supine Exercises  thoracic lift 10 sec x 5       Shoulder Exercises: Seated   Other Seated Exercises  thoracic ext over back of chair with Rt hand supporting head x 3 reps      Shoulder Exercises: Standing   Internal Rotation  AAROM;Left;10 reps with strap assist over shoulder, 10 sec hold    Extension  AAROM;10 reps;Left      Shoulder Exercises: Pulleys   Flexion  2 minutes    Scaption  2 minutes      Shoulder Exercises: Isometric Strengthening   Extension  -- 5 sec x 10 reps    External Rotation  -- 5 sec x 10 reps    Internal Rotation  -- 5 sec x 10 reps    ABduction  -- 5 sec x 10 reps      Shoulder Exercises: Stretch  Other Shoulder Stretches  prolonged snow angel 1-92mn shoulders at ~ 80 deg abd while on small pool noodle.        Cryotherapy   Number Minutes Cryotherapy  15 Minutes    Cryotherapy Location  Shoulder Lt ant/post    Type of Cryotherapy  Ice pack      Electrical Stimulation   Electrical Stimulation Location  Lt shoulder and periscapular muscles    Electrical Stimulation Action  IFC    Electrical Stimulation Parameters   to tolerance    Electrical Stimulation Goals  Pain      Manual Therapy   Manual therapy comments  pt supine     Soft tissue mobilization  STM to Lt thoracic paraspinals, rhomboids, levator, upper trap, ant bicep.     Scapular Mobilization  Lt    Passive ROM  Lt shoulder ER, flexion, scaption               PT Short Term Goals - 01/28/18 1338      PT SHORT TERM GOAL #1   Title  Improve posture and alignment with patient to demonstrate upright posture with posterior shoulder girdle musculature engaged 02/17/18    Time  6    Period  Weeks    Status  On-going      PT SHORT TERM GOAL #2   Title  PROM to 150 deg elevation; 80-85 deg ER in scapular plane 02/17/18    Time  6    Period  Weeks    Status  Partially  Met      PT SHORT TERM GOAL #3   Title  Progress with stabilization and AROM exercises per MD protocol 02/17/18    Time  6    Period  Weeks    Status  On-going      PT SHORT TERM GOAL #4   Title  Decrease pain to 0/10 at rest and no more than 2-4/10 with exercises and functional activities allowed by protocol 02/17/18    Time  6    Period  Weeks    Status  On-going        PT Long Term Goals - 02/03/18 0724      PT LONG TERM GOAL #1   Title  Full AROM Lt shoulder equal or greater that AROM Rt shoulder 03/31/18    Time  12    Period  Weeks    Status  On-going      PT LONG TERM GOAL #2   Title  4/5 to 4+/5 Lt shoulder strength 03/31/18    Time  12    Period  Weeks    Status  On-going      PT LONG TERM GOAL #3   Title  Progress with strengthening and functional activities allowing patient to return to normal activities as indicated 03/31/18    Time  12    Period  Weeks    Status  On-going      PT LONG TERM GOAL #4   Title  Independent in HEP 03/31/18    Time  12    Period  Weeks    Status  On-going      PT LONG TERM GOAL #5   Title  Improve FOTO to </= 36% limitation 03/31/18    Time  12    Period  Weeks    Status  On-going            Plan - 02/05/18 1259    Clinical Impression  Statement  Pt progressing well through rehab protocol with improvement in PROM within tissue limits and no pain.  Pt has some upcoming business travel which will affect his upcoming appts.     Rehab Potential  Good    PT Frequency  2x / week    PT Treatment/Interventions  Patient/family education;ADLs/Self Care Home Management;Cryotherapy;Electrical Stimulation;Iontophoresis 31m/ml Dexamethasone;Moist Heat;Ultrasound;Dry needling;Manual techniques;Neuromuscular re-education;Therapeutic activities;Therapeutic exercise    PT Next Visit Plan  Continue progression per protocol; manual work through [place] girdle and cervical spine musculature    Consulted and Agree with Plan of Care  Patient        Patient will benefit from skilled therapeutic intervention in order to improve the following deficits and impairments:  Postural dysfunction, Improper body mechanics, Pain, Increased fascial restricitons, Increased muscle spasms, Impaired UE functional use, Decreased mobility, Decreased range of motion, Decreased strength, Decreased activity tolerance  Visit Diagnosis: Acute pain of left shoulder  Abnormal posture  Other symptoms and signs involving the musculoskeletal system     Problem List Patient Active Problem List   Diagnosis Date Noted  . Diverticulitis large intestine 06/24/2014  . 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 History of migraine headaches 06/26/2012  . Hyperlipidemia 06/26/2012  . Ocular migraine 06/26/2012  . Essential tremor 06/26/2012  . Chronic insomnia 06/26/2012   JKerin Perna PTA 02/05/18 1:03 PM  [place] [address]Phone: [phone]  Fax:  [phone] Name: Tim AUBRYMRN: [id number] of Birth: 3March 03, 1962

## 2018-02-11 ENCOUNTER — Ambulatory Visit (INDEPENDENT_AMBULATORY_CARE_PROVIDER_SITE_OTHER): Payer: BLUE CROSS/BLUE SHIELD | Admitting: Physical Therapy

## 2018-02-11 DIAGNOSIS — R293 Abnormal posture: Secondary | ICD-10-CM

## 2018-02-11 DIAGNOSIS — R600 Localized edema: Secondary | ICD-10-CM

## 2018-02-11 DIAGNOSIS — M25512 Pain in left shoulder: Secondary | ICD-10-CM

## 2018-02-11 DIAGNOSIS — R29898 Other symptoms and signs involving the musculoskeletal system: Secondary | ICD-10-CM | POA: Diagnosis not present

## 2018-02-11 NOTE — Therapy (Signed)
[place], [place], [address] Phone: [phone]   Fax:  [phone]  Physical Therapy Treatment  Patient Details  Name: Tim Gordon MRN: [id number] Date of Birth: [birth year] Referring Provider: Dr. Susa Day   Encounter Date: 02/11/2018  PT End of Session - 02/11/18 1156    Visit Number  11    Number of Visits  24    Date for PT Re-Evaluation  03/31/18    PT Start Time  1150    PT Stop Time  1230    PT Time Calculation (min)  40 min    Activity Tolerance  Patient tolerated treatment well    Behavior During Therapy  [facility] for tasks assessed/performed       Past Medical History:  Diagnosis Date  . Allergy   . Blood in stool   . Diverticulitis   . GERD (gastroesophageal reflux disease)    CONTROLLED WITH DIET - NO MEDS  . Heart murmur    MVP - NEVER CAUSES ANY PROBLEMS; PT VERY ACTIVE - CAN PLAY RACKETBALL FOR COUPLE OF HOURS AND NEVER ANY CHEST C/O  . History of kidney stones   . Hyperlipidemia   . Migraines    OCULAR MIGRAINES  . Sleep apnea    STATES SLEEP STUDY 4 OR 5 YEARS AGO - TOLD BORDERLINE - TRIED CPAP FOR 30 DAYS - STATES IT DID NOT HELP - NO LONGER USES.    Past Surgical History:  Procedure Laterality Date  . APPENDECTOMY    . COLON SURGERY    . LAPAROSCOPIC PARTIAL COLECTOMY N/A 06/24/2014   Procedure: LAPAROSCOPIC ASSISTED  PARTIAL COLECTOMY;  Surgeon: Jackolyn Confer, MD;  Location: [facility];  Service: General;  Laterality: N/A;  . NASAL RECONSTRUCTION WITH SEPTAL REPAIR    . SPINE SURGERY  2001   rupture L4-L5  . VASECTOMY      There were no vitals filed for this visit.  Subjective Assessment - 02/11/18 1156    Subjective  Pt reports he feels his Lt arm is getting stronger.  He can now shampoo his head without any difficulty.  He noticed some soreness in outside of Lt shoulder after completing arm behind back stretch.     Pertinent History  LBP with lumbar surgery;  shoulder pain and dysfunction for two yrs prior to surgery     Patient Stated Goals  restore use of Lt arm     Currently in Pain?  No/denies    Pain Score  0-No pain         [facility] PT Assessment - 02/11/18 0001      Assessment   Medical Diagnosis  Lt RCR    Referring Provider  Dr. Susa Day    Onset Date/Surgical Date  12/20/17    Hand Dominance  Right    Next MD Visit  03/07/18      PROM   Left Shoulder Extension  64 Degrees    Left Shoulder Flexion  161 Degrees    Left Shoulder ABduction  161 Degrees    Left Shoulder External Rotation  85 Degrees       [facility] Adult PT Treatment/Exercise - 02/11/18 0001      Shoulder Exercises: Standing   Flexion  AROM;Both;10 reps;Limitations holding orange ball, scap squeeze     Flexion Limitations  then shoulder diagonals, holding small orange ball between hands x 10 each direction    Extension  AROM;Both;10 reps 5 sec pause, holding  yoga egg behind back.       Shoulder Exercises: ROM/Strengthening   Ball on Wall  walking ball up wall and stepping under for Lt shoulder flexion stretch x 7 reps, repeated in scaption x 6 reps.  scap squeeze with hand on small ball on wall, circles 20 cw/ccw with Lt shoulder flexion ~45 deg, repeated in scaption.        Shoulder Exercises: Stretch   Internal Rotation Stretch  2 reps    Other Shoulder Stretches  low doorway stretch x 15 sec       Modalities   Modalities  -- held, per pt request.       Manual Therapy   Manual therapy comments  pt prone    Soft tissue mobilization  STM to Lt posterior shoulder girdle and RTC, Lt thoracic paraspinals.         PT Short Term Goals - 01/28/18 1338      PT SHORT TERM GOAL #1   Title  Improve posture and alignment with patient to demonstrate upright posture with posterior shoulder girdle musculature engaged 02/17/18    Time  6    Period  Weeks    Status  On-going      PT SHORT TERM GOAL #2   Title  PROM to 150 deg elevation; 80-85 deg ER in scapular  plane 02/17/18    Time  6    Period  Weeks    Status  Partially Met      PT SHORT TERM GOAL #3   Title  Progress with stabilization and AROM exercises per MD protocol 02/17/18    Time  6    Period  Weeks    Status  On-going      PT SHORT TERM GOAL #4   Title  Decrease pain to 0/10 at rest and no more than 2-4/10 with exercises and functional activities allowed by protocol 02/17/18    Time  6    Period  Weeks    Status  On-going        PT Long Term Goals - 02/03/18 0724      PT LONG TERM GOAL #1   Title  Full AROM Lt shoulder equal or greater that AROM Rt shoulder 03/31/18    Time  12    Period  Weeks    Status  On-going      PT LONG TERM GOAL #2   Title  4/5 to 4+/5 Lt shoulder strength 03/31/18    Time  12    Period  Weeks    Status  On-going      PT LONG TERM GOAL #3   Title  Progress with strengthening and functional activities allowing patient to return to normal activities as indicated 03/31/18    Time  12    Period  Weeks    Status  On-going      PT LONG TERM GOAL #4   Title  Independent in HEP 03/31/18    Time  12    Period  Weeks    Status  On-going      PT LONG TERM GOAL #5   Title  Improve FOTO to </= 36% limitation 03/31/18    Time  12    Period  Weeks    Status  On-going            Plan - 02/11/18 1241    Clinical Impression Statement  Pt tolerated new AROM exercises well, without difficulty.  Pt cautioned  to stay within protocol during upcoming week, until we are able to progress him to resistive exercises. Pt continues to gain ROM in Lt shoulder each visit.  Progressing well towards goals.     Rehab Potential  Good    PT Frequency  2x / week    PT Duration  12 weeks    PT Treatment/Interventions  Patient/family education;ADLs/Self Care Home Management;Cryotherapy;Electrical Stimulation;Iontophoresis 30m/ml Dexamethasone;Moist Heat;Ultrasound;Dry needling;Manual techniques;Neuromuscular re-education;Therapeutic activities;Therapeutic exercise     PT Next Visit Plan  Continue progression per protocol, will be 9 wks s/p on 02/21/18       Patient will benefit from skilled therapeutic intervention in order to improve the following deficits and impairments:  Postural dysfunction, Improper body mechanics, Pain, Increased fascial restricitons, Increased muscle spasms, Impaired UE functional use, Decreased mobility, Decreased range of motion, Decreased strength, Decreased activity tolerance  Visit Diagnosis: Acute pain of left shoulder  Abnormal posture  Other symptoms and signs involving the musculoskeletal system     Problem List Patient Active Problem List   Diagnosis Date Noted  . Diverticulitis large intestine 06/24/2014  . 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 History of migraine headaches 06/26/2012  . Hyperlipidemia 06/26/2012  . Ocular migraine 06/26/2012  . Essential tremor 06/26/2012  . Chronic insomnia 06/26/2012   JKerin Perna PTA 02/11/18 12:54 PM  [place] [address]Phone: [phone]  Fax:  [phone] Name: Tim HOSIEMRN: [id number] of Birth: 3November 24, 1962

## 2018-02-13 ENCOUNTER — Encounter: Payer: BLUE CROSS/BLUE SHIELD | Admitting: Rehabilitative and Restorative Service Providers"

## 2018-02-21 ENCOUNTER — Ambulatory Visit (INDEPENDENT_AMBULATORY_CARE_PROVIDER_SITE_OTHER): Payer: BLUE CROSS/BLUE SHIELD | Admitting: Physical Therapy

## 2018-02-21 ENCOUNTER — Encounter: Payer: Self-pay | Admitting: Physical Therapy

## 2018-02-21 DIAGNOSIS — R29898 Other symptoms and signs involving the musculoskeletal system: Secondary | ICD-10-CM

## 2018-02-21 DIAGNOSIS — R600 Localized edema: Secondary | ICD-10-CM

## 2018-02-21 DIAGNOSIS — M25512 Pain in left shoulder: Secondary | ICD-10-CM | POA: Diagnosis not present

## 2018-02-21 DIAGNOSIS — R293 Abnormal posture: Secondary | ICD-10-CM | POA: Diagnosis not present

## 2018-02-21 NOTE — Therapy (Signed)
[place], [place], [address] Phone: [phone]   Fax:  [phone]  Physical Therapy Treatment  Patient Details  Name: Tim Gordon MRN: [id number] Date of Birth: 12/28/1960 Referring Provider: Dr. Susa Day   Encounter Date: 02/21/2018  PT End of Session - 02/21/18 1402    Visit Number  12    Number of Visits  24    Date for PT Re-Evaluation  03/31/18    PT Start Time  1402    PT Stop Time  1445    PT Time Calculation (min)  43 min    Activity Tolerance  No increased pain;Patient tolerated treatment well    Behavior During Therapy  [facility] for tasks assessed/performed       Past Medical History:  Diagnosis Date  . Allergy   . Blood in stool   . Diverticulitis   . GERD (gastroesophageal reflux disease)    CONTROLLED WITH DIET - NO MEDS  . Heart murmur    MVP - NEVER CAUSES ANY PROBLEMS; PT VERY ACTIVE - CAN PLAY RACKETBALL FOR COUPLE OF HOURS AND NEVER ANY CHEST C/O  . History of kidney stones   . Hyperlipidemia   . Migraines    OCULAR MIGRAINES  . Sleep apnea    STATES SLEEP STUDY 4 OR 5 YEARS AGO - TOLD BORDERLINE - TRIED CPAP FOR 30 DAYS - STATES IT DID NOT HELP - NO LONGER USES.    Past Surgical History:  Procedure Laterality Date  . APPENDECTOMY    . COLON SURGERY    . LAPAROSCOPIC PARTIAL COLECTOMY N/A 06/24/2014   Procedure: LAPAROSCOPIC ASSISTED  PARTIAL COLECTOMY;  Surgeon: Jackolyn Confer, MD;  Location: [facility];  Service: General;  Laterality: N/A;  . NASAL RECONSTRUCTION WITH SEPTAL REPAIR    . SPINE SURGERY  2001   rupture L4-L5  . VASECTOMY      There were no vitals filed for this visit.  Subjective Assessment - 02/21/18 1408    Subjective  Pt reports his Lt shoulder ROM has improved, "I can reach further in all directions". He can put deodorant on his Rt armpit (horiz add with LUE) but it is still a stretch/tight.      Currently in Pain?  No/denies    Pain Score   0-No pain         [facility] PT Assessment - 02/21/18 0001      Assessment   Medical Diagnosis  Lt RCR    Referring Provider  Dr. Susa Day    Onset Date/Surgical Date  12/20/17    Hand Dominance  Right    Next MD Visit  03/07/18      ROM / Strength   AROM / PROM / Strength  AROM      AROM   AROM Assessment Site  Shoulder    Right/Left Shoulder  Left    Left Shoulder Extension  60 Degrees    Left Shoulder Flexion  161 Degrees    Left Shoulder ABduction  157 Degrees    Left Shoulder Internal Rotation  -- thumb to T10    Left Shoulder External Rotation  92 Degrees       [facility] Adult PT Treatment/Exercise - 02/21/18 0001      Shoulder Exercises: Standing   External Rotation  Strengthening;Left;10 reps;Theraband 2 sets    Theraband Level (Shoulder External Rotation)  Level 1 (Yellow)    Internal Rotation  Strengthening;Left;10 reps;Theraband 2 sets  Theraband Level (Shoulder Internal Rotation)  Level 1 (Yellow)    Extension  Strengthening;Both;10 reps;Theraband 2 sets    Theraband Level (Shoulder Extension)  Level 1 (Yellow);Level 2 (Red)    Row  Strengthening;Both;10 reps;Theraband 2 sets    Theraband Level (Shoulder Row)  Level 1 (Yellow);Level 2 (Red)      Shoulder Exercises: ROM/Strengthening   UBE (Upper Arm Bike)  L1: standing,  1.5 min forward/ 1.5 min backward.     Ball on Wall  walking ball on wall, stepping under for stretch x 5 reps.      Other ROM/Strengthening Exercises  2# weight onto shoulder height shelf x 10 reps, 1# to eye level shelf x 10 reps with LUE.       Shoulder Exercises: Stretch   Other Shoulder Stretches  3 position doorway stretch x 15-20 sec x 2 reps each position.       Modalities   Modalities  -- pt declined      Manual Therapy   Manual therapy comments  Pt seated.     Soft tissue mobilization  Edge tool assistance to Lt levator, rhomboid, posterior shoulder, pec and bicep (mid belly) to descrease fascial restrictions and improve ROM.              PT Education - 02/21/18 1655    Education provided  Yes    Education Details  HEP - pt issued red/ yellow bands.     Person(s) Educated  Patient    Methods  Explanation;Handout;Verbal cues;Tactile cues;Demonstration    Comprehension  Verbalized understanding;Returned demonstration       PT Short Term Goals - 01/28/18 1338      PT SHORT TERM GOAL #1   Title  Improve posture and alignment with patient to demonstrate upright posture with posterior shoulder girdle musculature engaged 02/17/18    Time  6    Period  Weeks    Status  On-going      PT SHORT TERM GOAL #2   Title  PROM to 150 deg elevation; 80-85 deg ER in scapular plane 02/17/18    Time  6    Period  Weeks    Status  Partially Met      PT SHORT TERM GOAL #3   Title  Progress with stabilization and AROM exercises per MD protocol 02/17/18    Time  6    Period  Weeks    Status  On-going      PT SHORT TERM GOAL #4   Title  Decrease pain to 0/10 at rest and no more than 2-4/10 with exercises and functional activities allowed by protocol 02/17/18    Time  6    Period  Weeks    Status  On-going        PT Long Term Goals - 02/03/18 0724      PT LONG TERM GOAL #1   Title  Full AROM Lt shoulder equal or greater that AROM Rt shoulder 03/31/18    Time  12    Period  Weeks    Status  On-going      PT LONG TERM GOAL #2   Title  4/5 to 4+/5 Lt shoulder strength 03/31/18    Time  12    Period  Weeks    Status  On-going      PT LONG TERM GOAL #3   Title  Progress with strengthening and functional activities allowing patient to return to normal activities as indicated 03/31/18  Time  12    Period  Weeks    Status  On-going      PT LONG TERM GOAL #4   Title  Independent in HEP 03/31/18    Time  12    Period  Weeks    Status  On-going      PT LONG TERM GOAL #5   Title  Improve FOTO to </= 36% limitation 03/31/18    Time  12    Period  Weeks    Status  On-going            Plan - 02/21/18 1403     Clinical Impression Statement  Pt is now 9 wks s/p Lt shoulder RTC repair.  His shoulder ROM has progressed well; nearly equat to RUE.  He tolerated new light resistance exercises without increase in symptoms.  Manual therapy addressed fascial restriction affecting ADLs; pt reported improved ability to perform horz add afterward.  Progressing well within rehab protocol.      Rehab Potential  Good    PT Frequency  2x / week    PT Duration  12 weeks    PT Treatment/Interventions  Patient/family education;ADLs/Self Care Home Management;Cryotherapy;Electrical Stimulation;Iontophoresis 49m/ml Dexamethasone;Moist Heat;Ultrasound;Dry needling;Manual techniques;Neuromuscular re-education;Therapeutic activities;Therapeutic exercise    PT Next Visit Plan  Continue progression per protocol,     Consulted and Agree with Plan of Care  Patient       Patient will benefit from skilled therapeutic intervention in order to improve the following deficits and impairments:     Visit Diagnosis: Acute pain of left shoulder  Abnormal posture  Other symptoms and signs involving the musculoskeletal system     Problem List Patient Active Problem List   Diagnosis Date Noted  . Diverticulitis large intestine 06/24/2014  . 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 History of migraine headaches 06/26/2012  . Hyperlipidemia 06/26/2012  . Ocular migraine 06/26/2012  . Essential tremor 06/26/2012  . Chronic insomnia 06/26/2012   JKerin Perna PTA 02/21/18 4:55 PM  [place] [address]Phone: [phone]  Fax:  [phone] Name: Tim DESROCHESMRN: [id number] of Birth: 310-14-1962

## 2018-02-21 NOTE — Patient Instructions (Signed)
RED BAND  Resistive Band Rowing   With resistive band anchored in door, grasp both ends. Keeping elbows bent, pull back, squeezing shoulder blades together. Hold _3-5___ seconds. Repeat _10-30___ times. Do __1__ sessions per day.   Strengthening: Resisted Extension   Hold tubing with both hands, arms forward. Pull arms back, elbow straight. Repeat _10-30___ times per set. Do ____ sets per session. Do _1___ sessions per day.   Chest Flexibility: Shoulder Opener (Doorframe)   Roll shoulders back and down, pressing forward against doorframe. Hold for __10__ breaths. Repeat __1-2__ times.  Strengthening: Resisted Internal Rotation   Hold tubing in left hand, elbow at side and forearm out. Rotate forearm in across body. Repeat _10_ times per set. Do __2__ sets per session. Do _1_ sessions per day.  http://orth.exer.us/830   Copyright  VHI. All rights reserved.  Strengthening: Resisted External Rotation   Hold tubing in right hand, elbow at side and forearm across body. Rotate forearm out. Repeat __10_ times per set. Do __2__ sets per session. Do _1___ sessions per day.

## 2018-02-28 ENCOUNTER — Ambulatory Visit (INDEPENDENT_AMBULATORY_CARE_PROVIDER_SITE_OTHER): Payer: BLUE CROSS/BLUE SHIELD | Admitting: Physical Therapy

## 2018-02-28 DIAGNOSIS — M25512 Pain in left shoulder: Secondary | ICD-10-CM | POA: Diagnosis not present

## 2018-02-28 DIAGNOSIS — R293 Abnormal posture: Secondary | ICD-10-CM

## 2018-02-28 DIAGNOSIS — R29898 Other symptoms and signs involving the musculoskeletal system: Secondary | ICD-10-CM

## 2018-02-28 DIAGNOSIS — R600 Localized edema: Secondary | ICD-10-CM

## 2018-02-28 NOTE — Therapy (Signed)
[facility] [address] [place], [place], [address] Phone: [phone]   Fax:  [phone]  Physical Therapy Treatment  Patient Details  Name: Tim Gordon MRN: [id number] Date of Birth: 10/17/1960 Referring Provider: Dr. Jeffrey Beane   Encounter Date: 02/28/2018  PT End of Session - 02/28/18 1106    Visit Number  13    Number of Visits  24    Date for PT Re-Evaluation  03/31/18    PT Start Time  1015    PT Stop Time  1100    PT Time Calculation (min)  45 min       Past Medical History:  Diagnosis Date  . Allergy   . Blood in stool   . Diverticulitis   . GERD (gastroesophageal reflux disease)    CONTROLLED WITH DIET - NO MEDS  . Heart murmur    MVP - NEVER CAUSES ANY PROBLEMS; PT VERY ACTIVE - CAN PLAY RACKETBALL FOR COUPLE OF HOURS AND NEVER ANY CHEST C/O  . History of kidney stones   . Hyperlipidemia   . Migraines    OCULAR MIGRAINES  . Sleep apnea    STATES SLEEP STUDY 4 OR 5 YEARS AGO - TOLD BORDERLINE - TRIED CPAP FOR 30 DAYS - STATES IT DID NOT HELP - NO LONGER USES.    Past Surgical History:  Procedure Laterality Date  . APPENDECTOMY    . COLON SURGERY    . LAPAROSCOPIC PARTIAL COLECTOMY N/A 06/24/2014   Procedure: LAPAROSCOPIC ASSISTED  PARTIAL COLECTOMY;  Surgeon: Todd Rosenbower, MD;  Location: [facility];  Service: General;  Laterality: N/A;  . NASAL RECONSTRUCTION WITH SEPTAL REPAIR    . SPINE SURGERY  2001   rupture L4-L5  . VASECTOMY      There were no vitals filed for this visit.  Subjective Assessment - 02/28/18 1018    Subjective  Pt reports his Lt shoulder is sore.  He thinks it is from using Lt arm to hop up onto a brick pillar last night. It didn't hurt last night, but woke up with increased soreness.  He is doing band exercises every other day.      Patient Stated Goals  restore use of Lt arm     Currently in Pain?  Yes    Pain Score  2     Pain Location  Shoulder    Pain  Orientation  Left    Pain Descriptors / Indicators  Sore    Aggravating Factors   steering the wheel with only Lt arm    Pain Relieving Factors  ?         [facility] PT Assessment - 02/28/18 0001      Assessment   Medical Diagnosis  Lt RCR    Referring Provider  Dr. Jeffrey Beane    Onset Date/Surgical Date  12/20/17    Hand Dominance  Right    Next MD Visit  03/07/18      ROM / Strength   AROM / PROM / Strength  Strength;AROM      AROM   AROM Assessment Site  Shoulder    Right/Left Shoulder  Left    Left Shoulder Extension  60 Degrees    Left Shoulder Flexion  161 Degrees    Left Shoulder ABduction  157 Degrees    Left Shoulder Internal Rotation  -- thumb to T9    Left Shoulder External Rotation  92 Degrees      Strength     Strength Assessment Site  Shoulder    Right/Left Shoulder  Left    Left Shoulder Flexion  4/5    Left Shoulder Extension  5/5    Left Shoulder ABduction  4/5    Left Shoulder Internal Rotation  -- 5-/5    Left Shoulder External Rotation  4/5       [facility] Adult PT Treatment/Exercise - 02/28/18 0001      Shoulder Exercises: Standing   External Rotation  Strengthening;Left;10 reps;Theraband    Theraband Level (Shoulder External Rotation)  Level 2 (Red)    Internal Rotation  Strengthening;Left;10 reps;Theraband    Theraband Level (Shoulder Internal Rotation)  Level 2 (Red)    Flexion  Strengthening;Left;10 reps;Theraband [place] 4    Theraband Level (Shoulder Flexion)  Level 2 (Red)    Extension  Strengthening;Left;10 reps;Theraband    Theraband Level (Shoulder Extension)  Level 2 (Red)    Row  Strengthening;Left;Both;10 reps;Theraband    Theraband Level (Shoulder Row)  Level 2 (Red)      Shoulder Exercises: ROM/Strengthening   UBE (Upper Arm Bike)  L1: standing,  1.5 min forward/ 1.5 min backward.     Wall Pushups  15 reps;Limitations 2 sets, elbows in, elbows out.     Wall Pushups Limitations  reverse wall push ups x 10 (pushing elbows into wall,  with scap squeeze)      Shoulder Exercises: Stretch   Internal Rotation Stretch  2 reps 15 sec, strap over shoulder    Wall Stretch - Flexion  3 reps;10 seconds    Other Shoulder Stretches  Lt tricep stretch x 15 sec x 2 ;     Other Shoulder Stretches  3 position doorway stretch x 30 sec each position;  shoulder ext bilat - hands behind back.       Modalities   Modalities  -- pt declined      Manual Therapy   Manual therapy comments  Pt seated.     Soft tissue mobilization  Edge tool assistance to Lt levator, rhomboid, posterior shoulder, pec and bicep (mid belly) to descrease fascial restrictions and improve ROM.               PT Short Term Goals - 02/28/18 1353      PT SHORT TERM GOAL #1   Title  Improve posture and alignment with patient to demonstrate upright posture with posterior shoulder girdle musculature engaged 02/17/18    Time  6    Period  Weeks    Status  Achieved      PT SHORT TERM GOAL #2   Title  PROM to 150 deg elevation; 80-85 deg ER in scapular plane 02/17/18    Time  6    Period  Weeks    Status  Achieved      PT SHORT TERM GOAL #3   Title  Progress with stabilization and AROM exercises per MD protocol 02/17/18    Time  6    Period  Weeks    Status  Achieved      PT SHORT TERM GOAL #4   Title  Decrease pain to 0/10 at rest and no more than 2-4/10 with exercises and functional activities allowed by protocol 02/17/18    Time  6    Period  Weeks    Status  Achieved        PT Long Term Goals - 02/28/18 1353      PT LONG TERM GOAL #1   Title  Full   AROM Lt shoulder equal or greater that AROM Rt shoulder 03/31/18    Time  12    Period  Weeks    Status  Partially Met      PT LONG TERM GOAL #2   Title  4/5 to 4+/5 Lt shoulder strength 03/31/18    Time  12    Period  Weeks    Status  Partially Met      PT LONG TERM GOAL #3   Title  Progress with strengthening and functional activities allowing patient to return to normal activities as indicated  03/31/18    Time  12    Period  Weeks    Status  Partially Met      PT LONG TERM GOAL #4   Title  Independent in HEP 03/31/18    Time  12    Period  Weeks    Status  On-going      PT LONG TERM GOAL #5   Title  Improve FOTO to </= 36% limitation 03/31/18    Time  12    Period  Weeks    Status  On-going            Plan - 02/28/18 1348    Clinical Impression Statement  Pt has tolerated light resistance Lt shoulder strengthening exercises without difficulty or pain.  He continues with some strength deficits in Lt ER, abduction and flexion.  Manual therapy has address fascial restrictions in periscapular musculature.  Pt has met all STG's and has partially met LTGs.  He will benefit from continued PT intervention to maximize functional mobility and return to previous level of function.     Rehab Potential  Good    PT Frequency  2x / week    PT Duration  12 weeks    PT Treatment/Interventions  Patient/family education;ADLs/Self Care Home Management;Cryotherapy;Electrical Stimulation;Iontophoresis 4mg/ml Dexamethasone;Moist Heat;Ultrasound;Dry needling;Manual techniques;Neuromuscular re-education;Therapeutic activities;Therapeutic exercise    PT Next Visit Plan  Continue progression per rehab protocol. FOTO    Consulted and Agree with Plan of Care  Patient       Patient will benefit from skilled therapeutic intervention in order to improve the following deficits and impairments:  Postural dysfunction, Improper body mechanics, Pain, Increased fascial restricitons, Increased muscle spasms, Impaired UE functional use, Decreased mobility, Decreased range of motion, Decreased strength, Decreased activity tolerance  Visit Diagnosis: Acute pain of left shoulder  Abnormal posture  Other symptoms and signs involving the musculoskeletal system     Problem List Patient Active Problem List   Diagnosis Date Noted  . Diverticulitis large intestine 06/24/2014  . 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 History of migraine headaches 06/26/2012  . Hyperlipidemia 06/26/2012  . Ocular migraine 06/26/2012  . Essential tremor 06/26/2012  . Chronic insomnia 06/26/2012    Carlson-Long, PTA 02/28/18 1:54 PM  [facility] [address] [place], [place], [address] Phone: [phone]   Fax:  [phone]  Name: Tim Gordon MRN: [id number] Date of Birth: 02/17/1961   

## 2018-03-07 ENCOUNTER — Ambulatory Visit (INDEPENDENT_AMBULATORY_CARE_PROVIDER_SITE_OTHER): Payer: BLUE CROSS/BLUE SHIELD | Admitting: Physical Therapy

## 2018-03-07 DIAGNOSIS — R293 Abnormal posture: Secondary | ICD-10-CM | POA: Diagnosis not present

## 2018-03-07 DIAGNOSIS — M25512 Pain in left shoulder: Secondary | ICD-10-CM | POA: Diagnosis not present

## 2018-03-07 DIAGNOSIS — R29898 Other symptoms and signs involving the musculoskeletal system: Secondary | ICD-10-CM

## 2018-03-07 NOTE — Patient Instructions (Signed)
Arm circles: on Wall    Knees bent, spine next to wall, forearm and hand against ball, palm forward, slide arm up. Then press back of hand into ball. Complete circles and up/down motion with ball.  Keep knees bent. . Do _3-4__ sessions per week.    Row and shoulder ext with GREEN.  Bicep and triceps with green band.  External rotation with red or green.    PNF Strengthening: Resisted    Standing with resistive band around each hand, bring right arm up and away, thumb back.  RED BAND, Repeat _10___ times per set. Do __2__ sets per session. Do _3-4___ sessions per week. Resistance: PNF D1 Extension    Opposite side toward anchor in shoulder width stance. Right palm back, pull down and away, rotating hand and arm. Palm starts and ends facing backward. Repeat __10_ times. Repeat with other arm for set. Rest __60_ seconds after set. Do _3-4__ sets per week.  Abduction: Scaption - Thumb Up (Dumbbell)    Lift arms out from sides, thumbs up. Repeat _10___ times per set. Do __1-2__ sets per session. Do _3-4___ sessions per week. Use __bands.    [facility] [address] [place], [place] [address]  [phone] (office) [phone] (fax)

## 2018-03-07 NOTE — Therapy (Addendum)
[facility] [address] [place], [place], [address] Phone: [phone]   Fax:  [phone]  Physical Therapy Treatment  Patient Details  Name: Tim Gordon MRN: [id number] Date of Birth: November 23, 1960 Referring Provider: Dr. Susa Day   Encounter Date: 03/07/2018  PT End of Session - 03/07/18 1453    Visit Number  14    Number of Visits  24    Date for PT Re-Evaluation  03/31/18    PT Start Time  1450    PT Stop Time  1537    PT Time Calculation (min)  47 min       Past Medical History:  Diagnosis Date  . Allergy   . Blood in stool   . Diverticulitis   . GERD (gastroesophageal reflux disease)    CONTROLLED WITH DIET - NO MEDS  . Heart murmur    MVP - NEVER CAUSES ANY PROBLEMS; PT VERY ACTIVE - CAN PLAY RACKETBALL FOR COUPLE OF HOURS AND NEVER ANY CHEST C/O  . History of kidney stones   . Hyperlipidemia   . Migraines    OCULAR MIGRAINES  . Sleep apnea    STATES SLEEP STUDY 4 OR 5 YEARS AGO - TOLD BORDERLINE - TRIED CPAP FOR 30 DAYS - STATES IT DID NOT HELP - NO LONGER USES.    Past Surgical History:  Procedure Laterality Date  . APPENDECTOMY    . COLON SURGERY    . LAPAROSCOPIC PARTIAL COLECTOMY N/A 06/24/2014   Procedure: LAPAROSCOPIC ASSISTED  PARTIAL COLECTOMY;  Surgeon: Jackolyn Confer, MD;  Location: [facility];  Service: General;  Laterality: N/A;  . NASAL RECONSTRUCTION WITH SEPTAL REPAIR    . SPINE SURGERY  2001   rupture L4-L5  . VASECTOMY      There were no vitals filed for this visit.  Subjective Assessment - 03/07/18 1638    Subjective  Pt reports he was released from his MD.  MD said he should avoid overhead lifting and golfing, but can chip; still in healing process.  He will be traveling to [place] for 2 wks.     Currently in Pain?  No/denies    Pain Score  0-No pain         [facility] PT Assessment - 03/07/18 0001      Assessment   Medical Diagnosis  Lt RCR    Referring Provider  Dr.  Susa Day    Onset Date/Surgical Date  12/20/17    Hand Dominance  Right    Next MD Visit  PRN      Observation/Other Assessments   Focus on Therapeutic Outcomes (FOTO)   37% limited (goal of 36%)       [facility] Adult PT Treatment/Exercise - 03/07/18 0001      Shoulder Exercises: Standing   External Rotation  Strengthening;Both;10 reps;Theraband    Theraband Level (Shoulder External Rotation)  Level 3 (Green)    Extension  Both;20 reps;Theraband    Theraband Level (Shoulder Extension)  Level 3 (Green)    Row  Strengthening;Both;Theraband;20 reps    Theraband Level (Shoulder Row)  Level 3 (Green)    Other Standing Exercises  tricep ext with green band       Shoulder Exercises: Therapy Ball   Other Therapy Ball Exercises  back of Lt hand against wall, arm abct to 90 deg, elbow bent at 90 deg, circles with ball.       Shoulder Exercises: ROM/Strengthening   UBE (Upper Arm Bike)  L2: standing,  2 min forward/ 1 min backward.     Wall Pushups  5 reps counter top      Shoulder Exercises: Isometric Strengthening   Other Isometric Exercises  5 sec plank      Shoulder Exercises: Stretch   Other Shoulder Stretches  Lt tricep stretch x 15 sec x 2 ;     Other Shoulder Stretches  3 position doorway stretch x 30 sec each position;  shoulder ext bilat - hands behind back. Overhead flexion stretch x 10 sec      Modalities   Modalities  -- pt declined      Manual Therapy   Soft tissue mobilization  Edge tool assistance to Lt levator, rhomboid, posterior shoulder, pec and bicep (mid belly) to descrease fascial restrictions and improve ROM.               PT Short Term Goals - 02/28/18 1353      PT SHORT TERM GOAL #1   Title  Improve posture and alignment with patient to demonstrate upright posture with posterior shoulder girdle musculature engaged 02/17/18    Time  6    Period  Weeks    Status  Achieved      PT SHORT TERM GOAL #2   Title  PROM to 150 deg elevation; 80-85 deg  ER in scapular plane 02/17/18    Time  6    Period  Weeks    Status  Achieved      PT SHORT TERM GOAL #3   Title  Progress with stabilization and AROM exercises per MD protocol 02/17/18    Time  6    Period  Weeks    Status  Achieved      PT SHORT TERM GOAL #4   Title  Decrease pain to 0/10 at rest and no more than 2-4/10 with exercises and functional activities allowed by protocol 02/17/18    Time  6    Period  Weeks    Status  Achieved        PT Long Term Goals - 03/07/18 1639      PT LONG TERM GOAL #1   Title  Full AROM Lt shoulder equal or greater that AROM Rt shoulder 03/31/18    Time  12    Period  Weeks    Status  Achieved      PT LONG TERM GOAL #2   Title  4/5 to 4+/5 Lt shoulder strength 03/31/18    Time  12    Period  Weeks    Status  Achieved      PT LONG TERM GOAL #3   Title  Progress with strengthening and functional activities allowing patient to return to normal activities as indicated 03/31/18    Time  12    Period  Weeks    Status  Achieved      PT LONG TERM GOAL #4   Title  Independent in HEP 03/31/18    Time  12    Period  Weeks    Status  Achieved      PT LONG TERM GOAL #5   Title  Improve FOTO to </= 36% limitation 03/31/18    Time  12    Period  Weeks    Status  On-going            Plan - 03/07/18 1640    Clinical Impression Statement  Pt tolerated advancement of exercises well, without any increase in  shoulder symptoms.  Pt has met most of his goals.  He has requested to hold therapy while [place] is [place] of country.  Making great progress.     Rehab Potential  Good    PT Frequency  2x / week    PT Duration  12 weeks    PT Treatment/Interventions  Patient/family education;ADLs/Self Care Home Management;Cryotherapy;Electrical Stimulation;Iontophoresis 84m/ml Dexamethasone;Moist Heat;Ultrasound;Dry needling;Manual techniques;Neuromuscular re-education;Therapeutic activities;Therapeutic exercise    PT Next Visit Plan  [place]'s progress; will hold for 3 wks - if pt doesn't return by 6/21- will d/c.     Consulted and Agree with Plan of Care  Patient       Patient will benefit from skilled therapeutic intervention in order to improve the following deficits and impairments:  Postural dysfunction, Improper body mechanics, Pain, Increased fascial restricitons, Increased muscle spasms, Impaired UE functional use, Decreased mobility, Decreased range of motion, Decreased strength, Decreased activity tolerance  Visit Diagnosis: Acute pain of left shoulder  Abnormal posture  Other symptoms and signs involving the musculoskeletal system     Problem List Patient Active Problem List   Diagnosis Date Noted  . Diverticulitis large intestine 06/24/2014  . 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 History of migraine headaches 06/26/2012  . Hyperlipidemia 06/26/2012  . Ocular migraine 06/26/2012  . Essential tremor 06/26/2012  . Chronic insomnia 06/26/2012   JKerin Perna PTA 03/07/18 4:41 PM  [address] [address]Phone: [phone]  Fax:  [phone] Name: Tim UPCHURCHMRN: [id number] of Birth: [birth year] PHYSICAL THERAPY DISCHARGE SUMMARY  Visits from Start of Care: 14  Current functional level related to goals / functional outcomes: See last progress note for discharge status   Remaining deficits: To continue with HEP to focus on posture, ROM, strength   Education / Equipment: HEP  Plan: Patient agrees to discharge.  Patient goals were met. Patient is being discharged due to meeting the stated rehab goals.  ?????    Celyn P. HHelene KelpPT, MPH 03/31/18 8:05 AM

## 2018-03-10 NOTE — Addendum Note (Signed)
Addended by: Everardo All on: 03/10/2018 04:52 PM   Modules accepted: Orders

## 2018-05-06 ENCOUNTER — Encounter: Payer: BLUE CROSS/BLUE SHIELD | Admitting: Family Medicine

## 2018-12-11 ENCOUNTER — Telehealth: Payer: Self-pay

## 2018-12-11 NOTE — Telephone Encounter (Signed)
Pt in office today with his daughter who is a patient of Dr Sheppard Coil and wanted to know if Dr Georgina Snell would be willing to accept him as a new primary care patient.   I advised pt that Dr Georgina Snell is not currently accepting new patients, but I would ask.   Pt does not have any specific concerns, just want to establish with this [facility] practice and was wanting a male provider.  Please advise

## 2018-12-12 NOTE — Telephone Encounter (Signed)
Patient advised. He is going to stay with his current provider.

## 2018-12-12 NOTE — Telephone Encounter (Signed)
I will will be happy to see Tim Gordon for marrow related issues or for workings or orthopedic issues however he will need to establish with 1 of the male providers for his regular care.  Again I am happy to see him for any male related issues that he needs that is not comfortable with a male provider.

## 2019-07-14 ENCOUNTER — Encounter (HOSPITAL_COMMUNITY): Payer: Self-pay | Admitting: Emergency Medicine

## 2019-07-14 ENCOUNTER — Other Ambulatory Visit: Payer: Self-pay

## 2019-07-14 ENCOUNTER — Emergency Department (HOSPITAL_COMMUNITY)
Admission: EM | Admit: 2019-07-14 | Discharge: 2019-07-14 | Disposition: A | Discharge status: Home / Self Care | Payer: BC Managed Care – PPO | Attending: Emergency Medicine | Admitting: Emergency Medicine

## 2019-07-14 ENCOUNTER — Emergency Department (HOSPITAL_COMMUNITY): Payer: BC Managed Care – PPO

## 2019-07-14 DIAGNOSIS — U071 COVID-19: Secondary | ICD-10-CM | POA: Diagnosis not present

## 2019-07-14 DIAGNOSIS — R0789 Other chest pain: Secondary | ICD-10-CM | POA: Diagnosis present

## 2019-07-14 DIAGNOSIS — R519 Headache, unspecified: Secondary | ICD-10-CM | POA: Insufficient documentation

## 2019-07-14 DIAGNOSIS — R030 Elevated blood-pressure reading, without diagnosis of hypertension: Secondary | ICD-10-CM | POA: Diagnosis not present

## 2019-07-14 DIAGNOSIS — Z79899 Other long term (current) drug therapy: Secondary | ICD-10-CM | POA: Insufficient documentation

## 2019-07-14 LAB — CBC
HCT: 45.3 % (ref 39.0–52.0)
Hemoglobin: 15.3 g/dL (ref 13.0–17.0)
MCH: 31.5 pg (ref 26.0–34.0)
MCHC: 33.8 g/dL (ref 30.0–36.0)
MCV: 93.4 fL (ref 80.0–100.0)
Platelets: 142 10*3/uL — ABNORMAL LOW (ref 150–400)
RBC: 4.85 MIL/uL (ref 4.22–5.81)
RDW: 12.6 % (ref 11.5–15.5)
WBC: 4.1 10*3/uL (ref 4.0–10.5)
nRBC: 0 % (ref 0.0–0.2)

## 2019-07-14 LAB — BASIC METABOLIC PANEL
Anion gap: 8 (ref 5–15)
BUN: 16 mg/dL (ref 6–20)
CO2: 31 mmol/L (ref 22–32)
Calcium: 9.3 mg/dL (ref 8.9–10.3)
Chloride: 99 mmol/L (ref 98–111)
Creatinine, Ser: 0.97 mg/dL (ref 0.61–1.24)
GFR calc Af Amer: >60 mL/min (ref >60)
GFR calc non Af Amer: >60 mL/min (ref >60)
Glucose, Bld: 101 mg/dL — ABNORMAL HIGH (ref 70–99)
Potassium: 3.5 mmol/L (ref 3.5–5.1)
Sodium: 138 mmol/L (ref 135–145)

## 2019-07-14 LAB — SARS CORONAVIRUS 2 BY RT PCR (HOSPITAL ORDER, PERFORMED IN ~~LOC~~ HOSPITAL LAB): SARS Coronavirus 2: POSITIVE — AB

## 2019-07-14 LAB — TROPONIN I (HIGH SENSITIVITY)
Troponin I (High Sensitivity): 4 ng/L (ref <18)
Troponin I (High Sensitivity): 5 ng/L (ref <18)

## 2019-07-14 LAB — D-DIMER, QUANTITATIVE: D-Dimer, Quant: <0.27 ug/mL-FEU (ref 0.00–0.50)

## 2019-07-14 MED ORDER — METOCLOPRAMIDE HCL 5 MG/ML IJ SOLN
10.0000 mg | Freq: Once | INTRAMUSCULAR | Status: AC
  Administered 2019-07-14: 10 mg via INTRAVENOUS
  Filled 2019-07-14: qty 2

## 2019-07-14 MED ORDER — KETOROLAC TROMETHAMINE 30 MG/ML IJ SOLN
30.0000 mg | Freq: Once | INTRAMUSCULAR | Status: AC
  Administered 2019-07-14: 30 mg via INTRAVENOUS
  Filled 2019-07-14: qty 1

## 2019-07-14 MED ORDER — SODIUM CHLORIDE 0.9 % IV BOLUS
1000.0000 mL | Freq: Once | INTRAVENOUS | Status: AC
  Administered 2019-07-14: 14:00:00 1000 mL via INTRAVENOUS

## 2019-07-14 MED ORDER — ALBUTEROL SULFATE HFA 108 (90 BASE) MCG/ACT IN AERS: 1.0000 | INHALATION_SPRAY | RESPIRATORY_TRACT | 0 refills | Status: DC | PRN

## 2019-07-14 MED ORDER — DIPHENHYDRAMINE HCL 50 MG/ML IJ SOLN
25.0000 mg | Freq: Once | INTRAMUSCULAR | Status: AC
  Administered 2019-07-14: 16:00:00 25 mg via INTRAVENOUS
  Filled 2019-07-14: qty 1

## 2019-07-14 MED ORDER — DEXAMETHASONE 6 MG PO TABS: 6.0000 mg | ORAL_TABLET | Freq: Every day | ORAL | 0 refills | Status: AC

## 2019-07-14 MED ORDER — SODIUM CHLORIDE 0.9% FLUSH
3.0000 mL | Freq: Once | INTRAVENOUS | Status: AC
  Administered 2019-07-14: 14:00:00 3 mL via INTRAVENOUS

## 2019-07-14 NOTE — ED Provider Notes (Signed)
[facility] EMERGENCY DEPARTMENT Provider Note   CSN: [id number] Arrival date & time: 07/14/19  1034     History   Chief Complaint Chief Complaint  Patient presents with  . Chest Pain    HPI Tim Gordon is a 58 y.o. male.     HPI Patient presents to the emergency department with headache that is bifrontal and pressure across his entire chest.  Patient states he also started with a slight cough last night.  Patient states that chest pain is been constant has not seemed to resolve.  Patient states that several coworkers tested positive for Covid.  The patient states that his blood pressure was also up this morning.  Patient states that he is very worried about Covid.  The patient deniesshortness of breath, headache,blurred vision, neck pain, fever, cough, weakness, numbness, dizziness, anorexia, edema, abdominal pain, nausea, vomiting, diarrhea, rash, back pain, dysuria, hematemesis, bloody stool, near syncope, or syncope. Past Medical History:  Diagnosis Date  . Allergy   . Blood in stool   . Diverticulitis   . GERD (gastroesophageal reflux disease)    CONTROLLED WITH DIET - NO MEDS  . Heart murmur    MVP - NEVER CAUSES ANY PROBLEMS; PT VERY ACTIVE - CAN PLAY RACKETBALL FOR COUPLE OF HOURS AND NEVER ANY CHEST C/O  . History of kidney stones   . Hyperlipidemia   . Migraines    OCULAR MIGRAINES  . Sleep apnea    STATES SLEEP STUDY 4 OR 5 YEARS AGO - TOLD BORDERLINE - TRIED CPAP FOR 30 DAYS - STATES IT DID NOT HELP - NO LONGER USES.    Patient Active Problem List   Diagnosis Date Noted  . Diverticulitis large intestine 06/24/2014  . 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 History of migraine headaches 06/26/2012  . Hyperlipidemia 06/26/2012  . Ocular migraine 06/26/2012  . Essential tremor 06/26/2012  . Chronic insomnia 06/26/2012    Past Surgical History:  Procedure Laterality Date  . APPENDECTOMY    . COLON SURGERY    .  LAPAROSCOPIC PARTIAL COLECTOMY N/A 06/24/2014   Procedure: LAPAROSCOPIC ASSISTED  PARTIAL COLECTOMY;  Surgeon: Jackolyn Confer, MD;  Location: [facility];  Service: General;  Laterality: N/A;  . NASAL RECONSTRUCTION WITH SEPTAL REPAIR    . SPINE SURGERY  2001   rupture L4-L5  . VASECTOMY          Home Medications    Prior to Admission medications   Medication Sig Start Date End Date Taking? Authorizing Provider  cetirizine (KLS ALLER-TEC) 10 MG tablet Take 10 mg by mouth at bedtime.     [provider]  clonazePAM (KLONOPIN) 0.5 MG tablet Take 1 tablet (0.5 mg total) by mouth at bedtime. 04/17/17   Burchette, Alinda Sierras, MD  propranolol (INDERAL) 20 MG tablet TAKE 1 TABLET DAILY 09/11/17   Burchette, Alinda Sierras, MD  ranitidine (ZANTAC) 150 MG capsule Take 150 mg by mouth 2 (two) times daily.    [provider]  vitamin B-12 (CYANOCOBALAMIN) 1000 MCG tablet Take 5,000 mcg by mouth every morning.     [provider]    Family History Family History  Problem Relation Age of Onset  . Arthritis Mother   . Hyperlipidemia Mother   . Stroke Mother   . Hypertension Mother   . Heart disease Mother        valvular heart disease ? which valve.  . Cancer Father 27  prostate  . Hypertension Father   . Cancer Maternal Grandmother        ovarian  . Stroke Paternal Grandfather     Social History Social History   Tobacco Use  . Smoking status: Never Smoker  . Smokeless tobacco: Never Used  Substance Use Topics  . Alcohol use: Yes    Comment: RARE ALCOHOL  . Drug use: No     Allergies   Biaxin [clarithromycin]   Review of Systems Review of Systems All other systems negative except as documented in the HPI. All pertinent positives and negatives as reviewed in the HPI.  Physical Exam Updated Vital Signs BP (!) 165/101   Pulse 82   Temp 98.3 F (36.8 C)   Resp (!) 22   Ht 5\' 9"  (1.753 m)   Wt 85.3 kg   SpO2 97%   BMI 27.76 kg/m   Physical Exam  Vitals signs and nursing note reviewed.  Constitutional:      General: He is not in acute distress.    Appearance: He is well-developed.  HENT:     Head: Normocephalic and atraumatic.  Eyes:     Pupils: Pupils are equal, round, and reactive to light.  Neck:     Musculoskeletal: Normal range of motion and neck supple.  Cardiovascular:     Rate and Rhythm: Normal rate and regular rhythm.     Heart sounds: Normal heart sounds. No murmur. No friction rub. No gallop.   Pulmonary:     Effort: Pulmonary effort is normal. No respiratory distress.     Breath sounds: Normal breath sounds. No decreased breath sounds, wheezing or rhonchi.  Abdominal:     General: Bowel sounds are normal. There is no distension.     Palpations: Abdomen is soft.     Tenderness: There is no abdominal tenderness.  Skin:    General: Skin is warm and dry.     Capillary Refill: Capillary refill takes less than 2 seconds.     Findings: No erythema or rash.  Neurological:     Mental Status: He is alert and oriented to person, place, and time.     Motor: No abnormal muscle tone.     Coordination: Coordination normal.  Psychiatric:        Behavior: Behavior normal.      ED Treatments / Results  Labs (all labs ordered are listed, but only abnormal results are displayed) Labs Reviewed  BASIC METABOLIC PANEL - Abnormal; Notable for the following components:      Result Value   Glucose, Bld 101 (*)    All other components within normal limits  CBC - Abnormal; Notable for the following components:   Platelets 142 (*)    All other components within normal limits  D-DIMER, QUANTITATIVE (NOT AT [facility])  TROPONIN I (HIGH SENSITIVITY)  TROPONIN I (HIGH SENSITIVITY)    EKG EKG Interpretation  Date/Time:  Tuesday July 14 2019 10:40:44 EDT Ventricular Rate:  73 PR Interval:    QRS Duration: 103 QT Interval:  374 QTC Calculation: 413 R Axis:   58 Text Interpretation:  Sinus rhythm Minimal ST depression,  inferior leads Artifact No old tracing to compare Confirmed by Francine Graven [phone]) on 07/14/2019 12:18:25 PM   Radiology Dg Chest 2 View  Result Date: 07/14/2019 CLINICAL DATA:  Chest pain. EXAM: CHEST - 2 VIEW COMPARISON:  None. FINDINGS: The heart size and mediastinal contours are within normal limits. No pneumothorax or pleural effusion is noted. Right lung  is clear. Minimal left basilar subsegmental atelectasis or scarring is noted. The visualized skeletal structures are unremarkable. IMPRESSION: Minimal left basilar subsegmental atelectasis or scarring. Electronically Signed   By: Marijo Conception M.D.   On: 07/14/2019 12:22   Ct Head Wo Contrast  Result Date: 07/14/2019 CLINICAL DATA:  Headache for 24 hours EXAM: CT HEAD WITHOUT CONTRAST TECHNIQUE: Contiguous axial images were obtained from the base of the skull through the vertex without intravenous contrast. COMPARISON:  None. FINDINGS: Brain: No evidence of acute infarction, hemorrhage, hydrocephalus, extra-axial collection or mass lesion/mass effect. Vascular: No hyperdense vessel. Skull: Normal. Negative for fracture or focal lesion. Sinuses/Orbits: No acute finding. Other: None. IMPRESSION: No focal acute intracranial abnormality identified. Electronically Signed   By: Abelardo Diesel M.D.   On: 07/14/2019 15:02    Procedures Procedures (including critical care time)  Medications Ordered in ED Medications  ketorolac (TORADOL) 30 MG/ML injection 30 mg (has no administration in time range)  metoCLOPramide (REGLAN) injection 10 mg (has no administration in time range)  sodium chloride flush (NS) 0.9 % injection 3 mL (3 mLs Intravenous Given 07/14/19 1400)  sodium chloride 0.9 % bolus 1,000 mL (0 mLs Intravenous Stopped 07/14/19 1508)     Initial Impression / Assessment and Plan / ED Course  I have reviewed the triage vital signs and the nursing notes.  Pertinent labs & imaging results that were available during my care of the  patient were reviewed by me and considered in my medical decision making (see chart for details).       Patient could be developing Covid at this point is unclear but we will test for this.  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  Patient does not have any acute distress other than his blood pressure is slightly elevated.  The patient is very anxious while talking with me.  I did advise him that headache and slight cough could be equal Covid and we will test but otherwise he has been stable.  Final Clinical Impressions(s) / ED Diagnoses   Final diagnoses:  None    ED Discharge Orders    None       Dalia Heading, PA-C 07/14/19 [place], [place], DO 07/17/19 [phone]

## 2019-07-14 NOTE — ED Triage Notes (Signed)
Pt c/o of central cp since last night.

## 2019-07-14 NOTE — ED Provider Notes (Signed)
Care assumed from [place] at shift change, please see his note for full details, but in brief Tim Gordon is a 58 y.o. male who presents with some chest tightness, headache and an intermittent cough since yesterday, patient had a COVID exposure 5 days ago at work but has not had any fevers or other symptoms.  He reports symptoms worsened today.  Work-up initiated by previous provider including basic labs, troponins, EKG, chest x-ray, head CT, headache cocktail and ultimately ordered a rapid COVID test.  Plan: Follow-up on COVID test and reassess after headache cocktail.  Physical Exam  BP (!) 149/92   Pulse 88   Temp 98.3 F (36.8 C)   Resp (!) 25   Ht 5\' 9"  (1.753 m)   Wt 85.3 kg   SpO2 97%   BMI 27.76 kg/m   Physical Exam Vitals signs and nursing note reviewed.  Constitutional:      General: He is not in acute distress.    Appearance: He is well-developed and normal weight. He is not diaphoretic.     Comments: Well-appearing and in no distress  HENT:     Head: Normocephalic and atraumatic.  Eyes:     General:        Right eye: No discharge.        Left eye: No discharge.     Pupils: Pupils are equal, round, and reactive to light.  Neck:     Musculoskeletal: Neck supple.  Cardiovascular:     Rate and Rhythm: Normal rate and regular rhythm.     Heart sounds: Normal heart sounds.  Pulmonary:     Effort: Pulmonary effort is normal. No respiratory distress.     Breath sounds: No rales.  Abdominal:     General: There is no distension.  Musculoskeletal:        General: No deformity.     Right lower leg: He exhibits no tenderness. No edema.     Left lower leg: He exhibits no tenderness. No edema.  Skin:    General: Skin is warm and dry.     Capillary Refill: Capillary refill takes less than 2 seconds.  Neurological:     Mental Status: He is alert.     Coordination: Coordination normal.     Comments: Speech is clear, able to follow commands Moves extremities  without ataxia, coordination intact  Psychiatric:        Mood and Affect: Mood normal.        Behavior: Behavior normal.     ED Course/Procedures   Labs Reviewed  SARS CORONAVIRUS 2 (HOSPITAL ORDER, [place] [facility]) - Abnormal; Notable for the following components:      Result Value   SARS Coronavirus 2 POSITIVE (*)    All other components within normal limits  BASIC METABOLIC PANEL - Abnormal; Notable for the following components:   Glucose, Bld 101 (*)    All other components within normal limits  CBC - Abnormal; Notable for the following components:   Platelets 142 (*)    All other components within normal limits  D-DIMER, QUANTITATIVE (NOT AT [facility])  TROPONIN I (HIGH SENSITIVITY)  TROPONIN I (HIGH SENSITIVITY)   EKG Interpretation  Date/Time:  Tuesday July 14 2019 10:40:44 EDT Ventricular Rate:  73 PR Interval:    QRS Duration: 103 QT Interval:  374 QTC Calculation: 413 R Axis:   58 Text Interpretation:  Sinus rhythm Minimal ST depression, inferior leads Artifact No old tracing  to compare Confirmed by Francine Graven [phone]) on 07/14/2019 12:18:25 PM  Dg Chest 2 View  Result Date: 07/14/2019 CLINICAL DATA:  Chest pain. EXAM: CHEST - 2 VIEW COMPARISON:  None. FINDINGS: The heart size and mediastinal contours are within normal limits. No pneumothorax or pleural effusion is noted. Right lung is clear. Minimal left basilar subsegmental atelectasis or scarring is noted. The visualized skeletal structures are unremarkable. IMPRESSION: Minimal left basilar subsegmental atelectasis or scarring. Electronically Signed   By: Marijo Conception M.D.   On: 07/14/2019 12:22   Ct Head Wo Contrast  Result Date: 07/14/2019 CLINICAL DATA:  Headache for 24 hours EXAM: CT HEAD WITHOUT CONTRAST TECHNIQUE: Contiguous axial images were obtained from the base of the skull through the vertex without intravenous contrast. COMPARISON:  None. FINDINGS: Brain: No evidence of  acute infarction, hemorrhage, hydrocephalus, extra-axial collection or mass lesion/mass effect. Vascular: No hyperdense vessel. Skull: Normal. Negative for fracture or focal lesion. Sinuses/Orbits: No acute finding. Other: None. IMPRESSION: No focal acute intracranial abnormality identified. Electronically Signed   By: Abelardo Diesel M.D.   On: 07/14/2019 15:02     Procedures  MDM   58 year old male presents with atypical chest pain, and headache, symptoms started yesterday, became more severe today.  He had COVID exposure at work on Wednesday.  No fevers.  No shortness of breath or increased work of breathing.  Has had an occasional cough.  Lab work is all been very reassuring, no leukocytosis, no electrolyte derangements, negative d-dimer, negative troponins x2, EKG without concerning ischemic changes and chest x-ray clear aside from minimal left basilar atelectasis.  Given frontal headache head CT ordered as well which was negative.  Headache improved somewhat with headache cocktail.  Patient's COVID test resulted and is positive which certainly explains many of his symptoms.  He ambulated in the room with me and maintained O2 saturations greater than 95% with good waveform.  He had no increased work of breathing while ambulating.  Feel patient is stable for discharge home with Tylenol, will also prescribe albuterol inhaler for cough and chest tightness and start patient on course of steroids.  I have encouraged patient to purchase home pulse oximeter, and discussed specific return precautions, encouraged PCP follow-up via telemedicine.  Patient and wife are concerned, as they exposed multiple family members this weekend before patient developed symptoms, I have encouraged him to contact all potential exposures, instructed them to quarantine and encourage them to set up outpatient testing.  Patient and wife expressed understanding and agreement with plan.  Discharged home in good condition.  Final  diagnoses:  COVID-19 virus infection  Atypical chest pain  Acute nonintractable headache, unspecified headache type    ED Discharge Orders         Ordered    dexamethasone (DECADRON) 6 MG tablet  Daily     07/14/19 1921    albuterol (VENTOLIN HFA) 108 (90 Base) MCG/ACT inhaler  Every 4 hours PRN     07/14/19 1921               Jacqlyn Larsen, PA-C 07/14/19 1946    Noemi Chapel, MD 07/17/19 [phone]

## 2019-07-14 NOTE — Discharge Instructions (Addendum)
You have tested positive for COVID-19 virus.  Please continue to quarantine at home and monitor your symptoms closely. You chest x-ray was clear. Antibiotics are not helpful in treating viral infection, the virus should run its course in about 14 days. Please make sure you are drinking plenty of fluids. You can treat your symptoms supportively with tylenol for fevers and pains, and over the counter cough syrups and throat lozenges to help with cough. Use prescribed inhaler as needed for chest tightness, or Shortness of breath. Take steroids as directed. If your symptoms are not improving please follow up with you Primary doctor.   I recommend that you purchase a home pulse ox to help better monitor your oxygen at home, if you start to have increased work of breathing or shortness of breath or your oxygen drops below 90% please immediately return to the hospital for reevaluation.  If you develop persistent fevers, shortness of breath or difficulty breathing, chest pain, severe headache and neck pain, persistent nausea and vomiting or other new or concerning symptoms return to the Emergency department.

## 2019-07-21 ENCOUNTER — Other Ambulatory Visit: Payer: Self-pay

## 2019-07-21 DIAGNOSIS — Z20822 Contact with and (suspected) exposure to covid-19: Secondary | ICD-10-CM

## 2019-07-21 NOTE — Addendum Note (Signed)
Addended by: Jonelle Sidle E on: 07/21/2019 10:20 AM   Modules accepted: Orders

## 2019-07-31 ENCOUNTER — Other Ambulatory Visit: Payer: Self-pay

## 2019-07-31 DIAGNOSIS — Z20822 Contact with and (suspected) exposure to covid-19: Secondary | ICD-10-CM

## 2019-08-01 LAB — NOVEL CORONAVIRUS, NAA: SARS-CoV-2, NAA: DETECTED — AB

## 2020-07-10 IMAGING — CT CT HEAD W/O CM
3 series · 15 of 46 positions shown, 18 images · non-contrast
Comparison: None.

CLINICAL DATA: Headache for 24 hours

EXAM:
CT HEAD WITHOUT CONTRAST
TECHNIQUE: Contiguous axial images were obtained from the base of the skull
through the vertex without intravenous contrast.

[Series 3: head w o · axial · 0.46mm/px · z∈[+306,+426]mm · 9 of 29 slices shown, 12 images]
[im 3/29  brain]
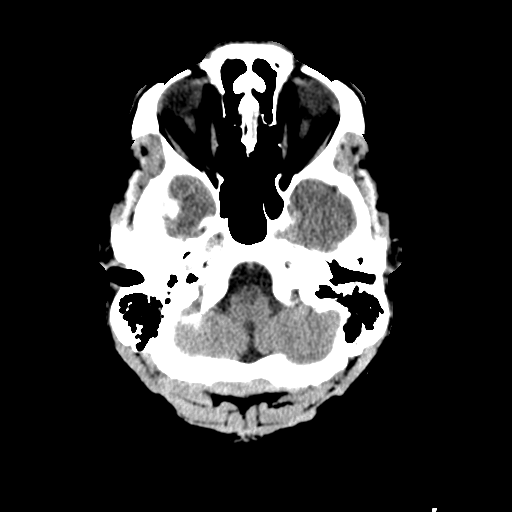
[im 3/29  bone]
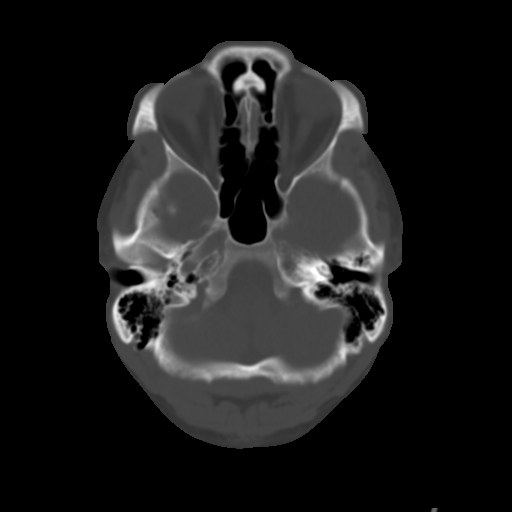
[im 6/29  brain]
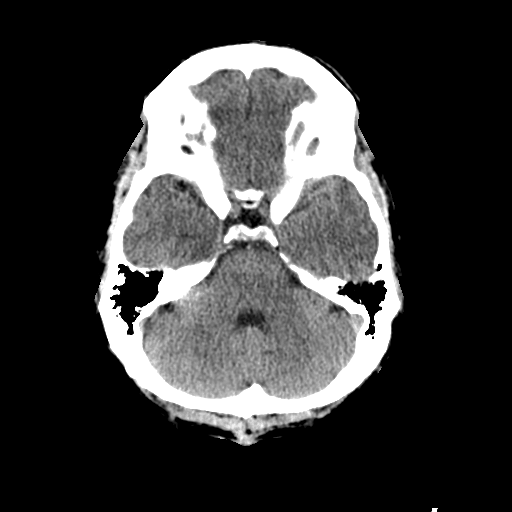
[im 9/29  brain]
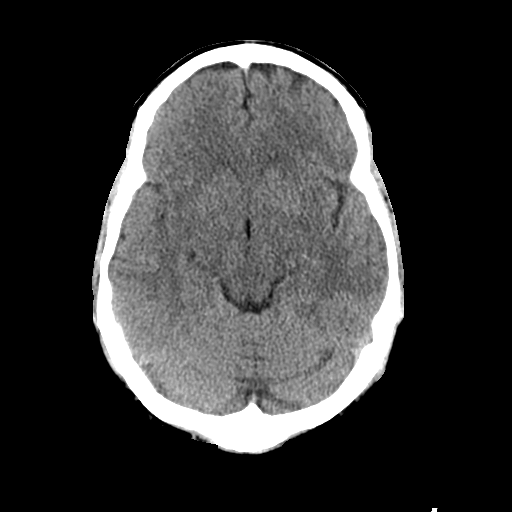
[im 12/29  brain]
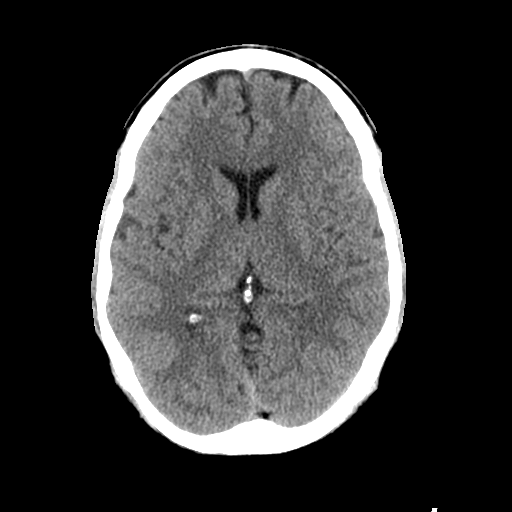
[im 15/29  brain]
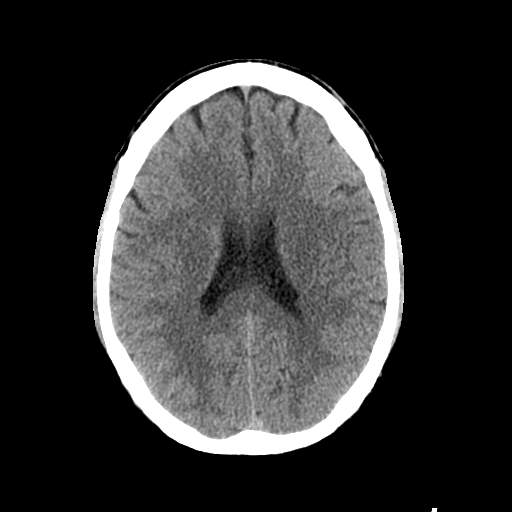
[im 15/29  bone]
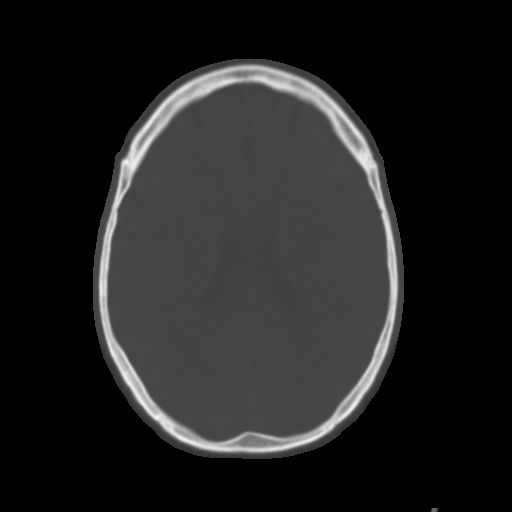
[im 18/29  brain]
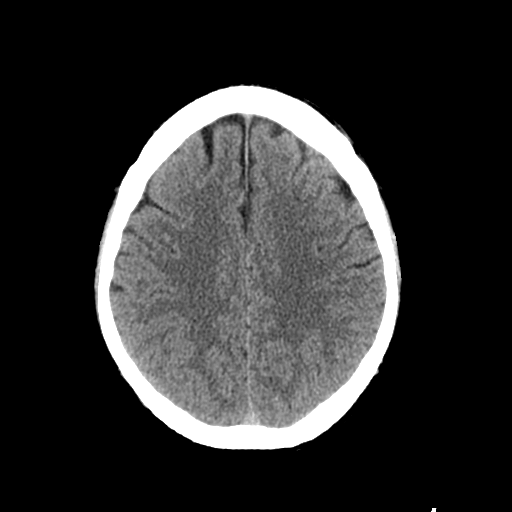
[im 21/29  brain]
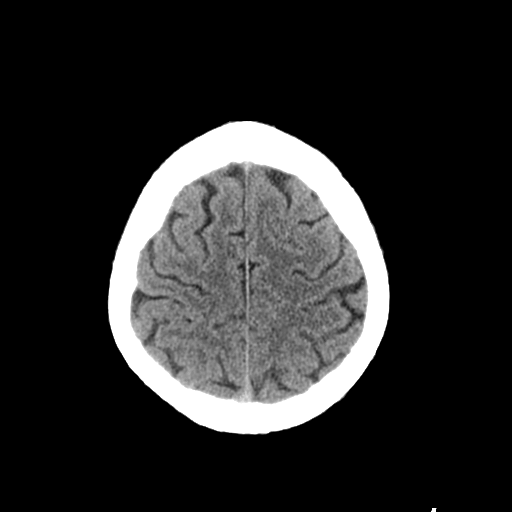
[im 24/29  brain]
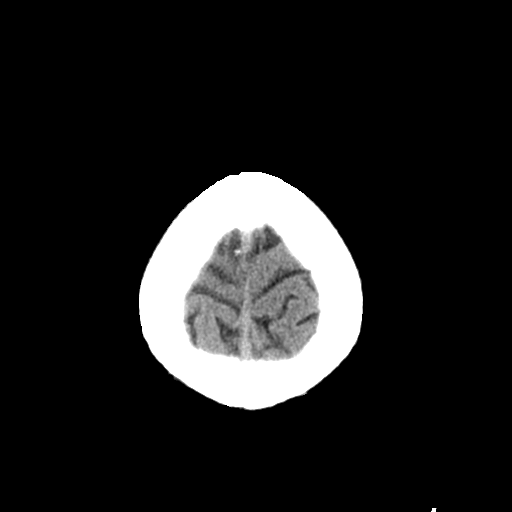
[im 27/29  brain]
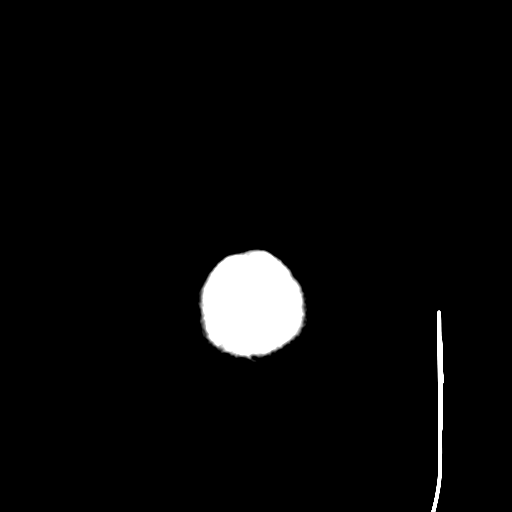
[im 27/29  bone]
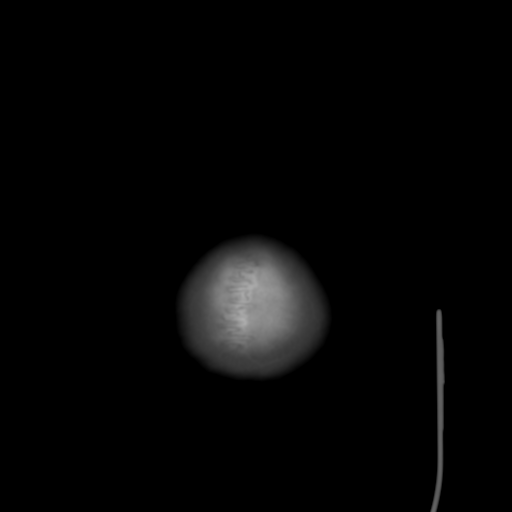

[Series 5: coronal soft · coronal · 0.29mm/px · 3 of 69 slices shown]
[im 23/69  brain]
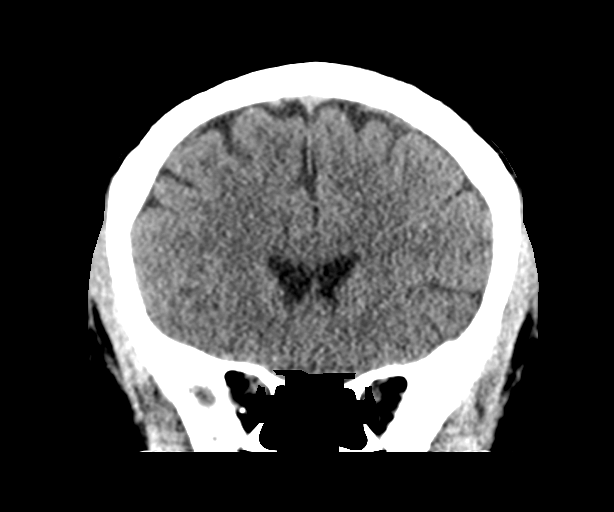
[im 31/69  brain]
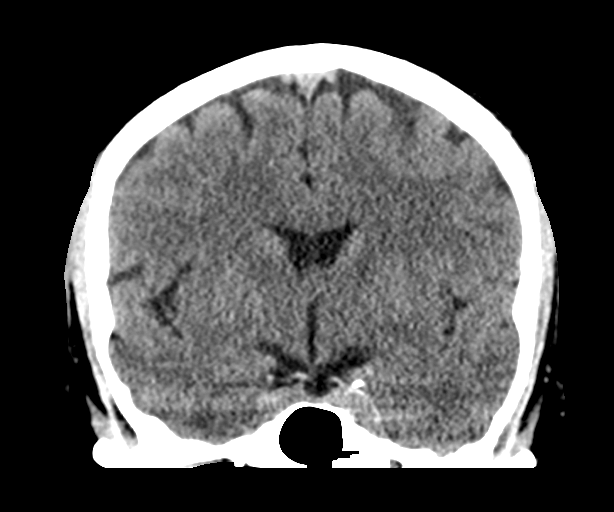
[im 38/69  brain]
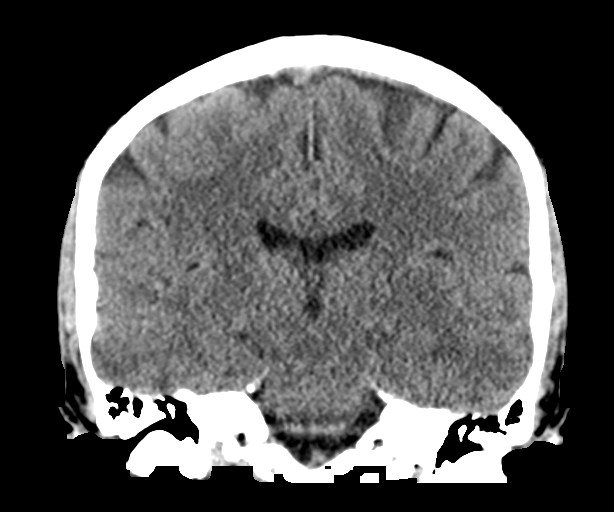

[Series 6: sagittal soft · sagittal · 0.33mm/px · 3 of 67 slices shown]
[im 23/67  brain]
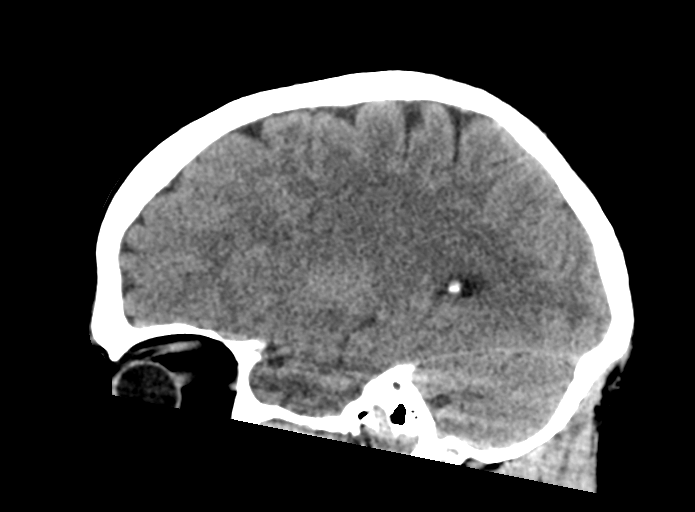
[im 34/67  brain]
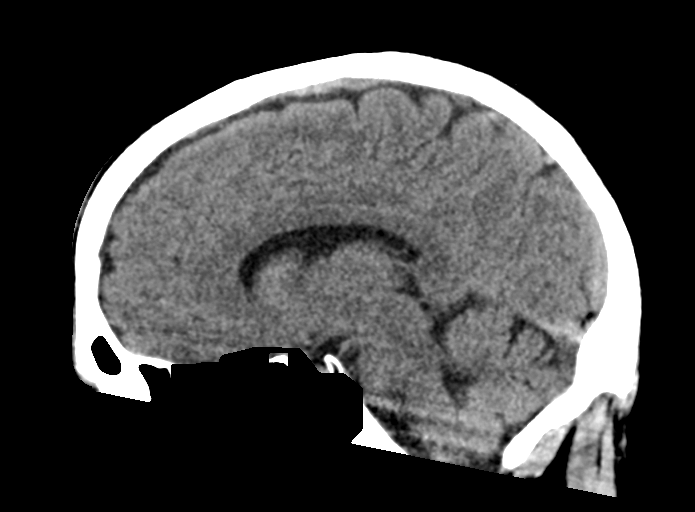
[im 45/67  brain]
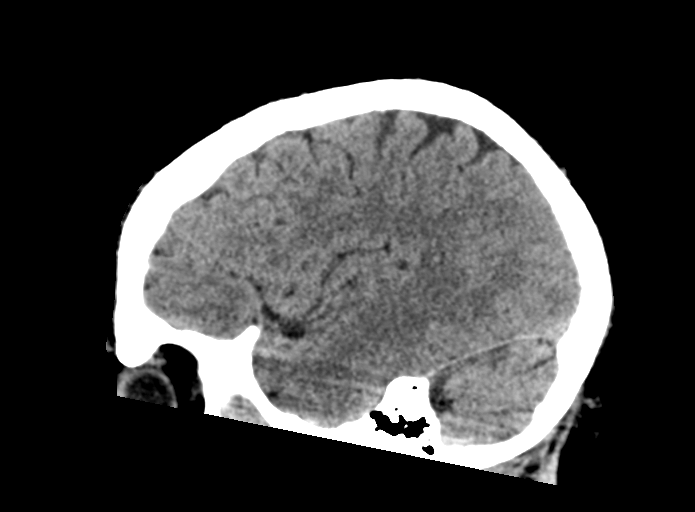

[15 of 46 positions shown; findings below may reference images not displayed]

FINDINGS: Brain: No evidence of acute infarction, hemorrhage, hydrocephalus,
extra-axial collection or mass lesion/mass effect.

Vascular: No hyperdense vessel.

Skull: Normal. Negative for fracture or focal lesion.

Sinuses/Orbits: No acute finding.

Other: None.
IMPRESSION: No focal acute intracranial abnormality identified.

## 2020-08-22 DIAGNOSIS — R059 Cough, unspecified: Secondary | ICD-10-CM | POA: Diagnosis not present

## 2020-08-22 DIAGNOSIS — Z20822 Contact with and (suspected) exposure to covid-19: Secondary | ICD-10-CM | POA: Diagnosis not present

## 2020-08-23 DIAGNOSIS — R059 Cough, unspecified: Secondary | ICD-10-CM | POA: Diagnosis not present

## 2020-08-25 DIAGNOSIS — J209 Acute bronchitis, unspecified: Secondary | ICD-10-CM | POA: Diagnosis not present

## 2020-09-23 DIAGNOSIS — Z0000 Encounter for general adult medical examination without abnormal findings: Secondary | ICD-10-CM | POA: Diagnosis not present

## 2020-09-23 DIAGNOSIS — N401 Enlarged prostate with lower urinary tract symptoms: Secondary | ICD-10-CM | POA: Diagnosis not present

## 2020-09-23 DIAGNOSIS — R053 Chronic cough: Secondary | ICD-10-CM | POA: Diagnosis not present

## 2020-09-23 DIAGNOSIS — M67479 Ganglion, unspecified ankle and foot: Secondary | ICD-10-CM | POA: Diagnosis not present

## 2020-10-25 DIAGNOSIS — K219 Gastro-esophageal reflux disease without esophagitis: Secondary | ICD-10-CM | POA: Diagnosis not present

## 2020-10-25 DIAGNOSIS — R059 Cough, unspecified: Secondary | ICD-10-CM | POA: Diagnosis not present

## 2020-10-25 DIAGNOSIS — J309 Allergic rhinitis, unspecified: Secondary | ICD-10-CM | POA: Diagnosis not present

## 2020-11-17 DIAGNOSIS — M67472 Ganglion, left ankle and foot: Secondary | ICD-10-CM | POA: Diagnosis not present

## 2020-11-17 DIAGNOSIS — M2012 Hallux valgus (acquired), left foot: Secondary | ICD-10-CM | POA: Diagnosis not present

## 2020-12-07 DIAGNOSIS — N400 Enlarged prostate without lower urinary tract symptoms: Secondary | ICD-10-CM | POA: Insufficient documentation

## 2020-12-07 DIAGNOSIS — Z87438 Personal history of other diseases of male genital organs: Secondary | ICD-10-CM | POA: Diagnosis not present

## 2020-12-08 DIAGNOSIS — R059 Cough, unspecified: Secondary | ICD-10-CM | POA: Diagnosis not present

## 2020-12-08 DIAGNOSIS — J3089 Other allergic rhinitis: Secondary | ICD-10-CM | POA: Diagnosis not present

## 2020-12-08 DIAGNOSIS — K219 Gastro-esophageal reflux disease without esophagitis: Secondary | ICD-10-CM | POA: Diagnosis not present

## 2020-12-08 DIAGNOSIS — J309 Allergic rhinitis, unspecified: Secondary | ICD-10-CM | POA: Diagnosis not present

## 2021-01-26 DIAGNOSIS — R3911 Hesitancy of micturition: Secondary | ICD-10-CM | POA: Diagnosis not present

## 2021-01-26 DIAGNOSIS — M7989 Other specified soft tissue disorders: Secondary | ICD-10-CM | POA: Diagnosis not present

## 2021-01-31 DIAGNOSIS — G4733 Obstructive sleep apnea (adult) (pediatric): Secondary | ICD-10-CM | POA: Diagnosis not present

## 2021-01-31 DIAGNOSIS — R0683 Snoring: Secondary | ICD-10-CM | POA: Diagnosis not present

## 2021-01-31 DIAGNOSIS — R0602 Shortness of breath: Secondary | ICD-10-CM | POA: Diagnosis not present

## 2021-01-31 DIAGNOSIS — R053 Chronic cough: Secondary | ICD-10-CM | POA: Diagnosis not present

## 2021-02-23 DIAGNOSIS — R059 Cough, unspecified: Secondary | ICD-10-CM | POA: Diagnosis not present

## 2021-02-23 DIAGNOSIS — J45909 Unspecified asthma, uncomplicated: Secondary | ICD-10-CM | POA: Diagnosis not present

## 2021-04-21 DIAGNOSIS — L814 Other melanin hyperpigmentation: Secondary | ICD-10-CM | POA: Diagnosis not present

## 2021-04-21 DIAGNOSIS — D229 Melanocytic nevi, unspecified: Secondary | ICD-10-CM | POA: Diagnosis not present

## 2021-04-21 DIAGNOSIS — L570 Actinic keratosis: Secondary | ICD-10-CM | POA: Diagnosis not present

## 2021-04-21 DIAGNOSIS — L821 Other seborrheic keratosis: Secondary | ICD-10-CM | POA: Diagnosis not present

## 2021-06-13 DIAGNOSIS — M2550 Pain in unspecified joint: Secondary | ICD-10-CM | POA: Diagnosis not present

## 2021-06-13 DIAGNOSIS — M79642 Pain in left hand: Secondary | ICD-10-CM | POA: Diagnosis not present

## 2021-06-13 DIAGNOSIS — M19042 Primary osteoarthritis, left hand: Secondary | ICD-10-CM | POA: Diagnosis not present

## 2021-06-13 DIAGNOSIS — M180 Bilateral primary osteoarthritis of first carpometacarpal joints: Secondary | ICD-10-CM | POA: Diagnosis not present

## 2021-06-13 DIAGNOSIS — H04123 Dry eye syndrome of bilateral lacrimal glands: Secondary | ICD-10-CM | POA: Diagnosis not present

## 2021-06-13 DIAGNOSIS — M19041 Primary osteoarthritis, right hand: Secondary | ICD-10-CM | POA: Diagnosis not present

## 2021-06-13 DIAGNOSIS — M79641 Pain in right hand: Secondary | ICD-10-CM | POA: Diagnosis not present

## 2021-06-15 DIAGNOSIS — I10 Essential (primary) hypertension: Secondary | ICD-10-CM | POA: Diagnosis not present

## 2021-06-15 DIAGNOSIS — R198 Other specified symptoms and signs involving the digestive system and abdomen: Secondary | ICD-10-CM | POA: Diagnosis not present

## 2021-06-15 DIAGNOSIS — R519 Headache, unspecified: Secondary | ICD-10-CM | POA: Diagnosis not present

## 2021-06-26 DIAGNOSIS — R351 Nocturia: Secondary | ICD-10-CM | POA: Diagnosis not present

## 2021-06-26 DIAGNOSIS — Z87438 Personal history of other diseases of male genital organs: Secondary | ICD-10-CM | POA: Diagnosis not present

## 2021-06-27 DIAGNOSIS — M25561 Pain in right knee: Secondary | ICD-10-CM | POA: Diagnosis not present

## 2021-06-30 DIAGNOSIS — R7303 Prediabetes: Secondary | ICD-10-CM | POA: Diagnosis not present

## 2021-07-12 DIAGNOSIS — M25561 Pain in right knee: Secondary | ICD-10-CM | POA: Diagnosis not present

## 2021-07-19 DIAGNOSIS — M25561 Pain in right knee: Secondary | ICD-10-CM | POA: Diagnosis not present

## 2021-07-19 DIAGNOSIS — M25562 Pain in left knee: Secondary | ICD-10-CM | POA: Diagnosis not present

## 2021-07-27 DIAGNOSIS — Z23 Encounter for immunization: Secondary | ICD-10-CM | POA: Diagnosis not present

## 2021-08-28 DIAGNOSIS — J40 Bronchitis, not specified as acute or chronic: Secondary | ICD-10-CM | POA: Diagnosis not present

## 2021-09-14 DIAGNOSIS — J0100 Acute maxillary sinusitis, unspecified: Secondary | ICD-10-CM | POA: Diagnosis not present

## 2021-09-27 DIAGNOSIS — R413 Other amnesia: Secondary | ICD-10-CM | POA: Diagnosis not present

## 2021-09-27 DIAGNOSIS — R17 Unspecified jaundice: Secondary | ICD-10-CM | POA: Diagnosis not present

## 2021-09-27 DIAGNOSIS — Z0000 Encounter for general adult medical examination without abnormal findings: Secondary | ICD-10-CM | POA: Diagnosis not present

## 2021-09-27 DIAGNOSIS — R140 Abdominal distension (gaseous): Secondary | ICD-10-CM | POA: Diagnosis not present

## 2021-09-27 DIAGNOSIS — R42 Dizziness and giddiness: Secondary | ICD-10-CM | POA: Diagnosis not present

## 2021-10-12 DIAGNOSIS — R160 Hepatomegaly, not elsewhere classified: Secondary | ICD-10-CM | POA: Diagnosis not present

## 2021-10-12 DIAGNOSIS — R140 Abdominal distension (gaseous): Secondary | ICD-10-CM | POA: Diagnosis not present

## 2021-10-12 DIAGNOSIS — I77811 Abdominal aortic ectasia: Secondary | ICD-10-CM | POA: Diagnosis not present

## 2021-10-12 DIAGNOSIS — K760 Fatty (change of) liver, not elsewhere classified: Secondary | ICD-10-CM | POA: Diagnosis not present

## 2021-10-12 DIAGNOSIS — R42 Dizziness and giddiness: Secondary | ICD-10-CM | POA: Diagnosis not present

## 2021-10-26 ENCOUNTER — Ambulatory Visit: Payer: BC Managed Care – PPO | Admitting: Neurology

## 2021-10-26 ENCOUNTER — Encounter: Payer: Self-pay | Admitting: Neurology

## 2021-10-26 VITALS — BP 131/85 | HR 68 | Ht 69.0 in | Wt 198.0 lb

## 2021-10-26 DIAGNOSIS — R419 Unspecified symptoms and signs involving cognitive functions and awareness: Secondary | ICD-10-CM

## 2021-10-26 DIAGNOSIS — R0683 Snoring: Secondary | ICD-10-CM

## 2021-10-26 DIAGNOSIS — G43109 Migraine with aura, not intractable, without status migrainosus: Secondary | ICD-10-CM

## 2021-10-26 DIAGNOSIS — R519 Headache, unspecified: Secondary | ICD-10-CM

## 2021-10-26 DIAGNOSIS — R454 Irritability and anger: Secondary | ICD-10-CM

## 2021-10-26 DIAGNOSIS — R42 Dizziness and giddiness: Secondary | ICD-10-CM

## 2021-10-26 DIAGNOSIS — G4733 Obstructive sleep apnea (adult) (pediatric): Secondary | ICD-10-CM | POA: Diagnosis not present

## 2021-10-26 DIAGNOSIS — G250 Essential tremor: Secondary | ICD-10-CM

## 2021-10-26 MED ORDER — PROPRANOLOL HCL 20 MG PO TABS: 20.0000 mg | ORAL_TABLET | Freq: Two times a day (BID) | ORAL | 3 refills | Status: DC

## 2021-10-26 NOTE — Progress Notes (Signed)
Subjective:    Patient ID: Tim Gordon is a 61 y.o. male.  HPI    Tim Age, MD, PhD [facility] [address] [place], [place] [address]  Dear Dr. Doyle Askew,  I saw your patient, Tim Gordon, upon your kind request in my neurologic clinic today for initial consultation of his dizziness.  The patient is unaccompanied today.  As you know, Tim Gordon is a 61 year old right-handed gentleman with an underlying medical history of reflux disease, kidney stones, migraine headaches, hyperlipidemia, allergies, essential tremor, diverticulitis, mild sleep apnea and overweight state, who reports 2 distinct episodes of dizziness in the past 6 months.  He felt lightheaded and felt like he could fall or pass out.  He denies any spinning sensation or nausea.  He did not have a headache at the time but does have a history of migraines.  Years ago he had migraine headaches and some years ago he started having ocular migraines without major headaches. He has had recurrent headaches but these are different from his migraines.  He wakes up in the middle of the night with a headache in the back of his head.  It feels like a tightness in the posterior head and neck muscles radiating to the shoulder areas.  He does not sleep well and snoring has worsened.  Of note, he tried an oral appliance for sleep apnea through Dr. Ron Parker but he developed TMJ issues and changes in his teeth alignment.  He could not tolerate the oral appliance.  His wife reports that his snoring and apneic pauses have become worse.    He takes propranolol 20 mg daily for his essential tremor and has been on the same dose for years.  His tremor has become worse over time as well.  His wife reports that he has had more difficulty with concentration and memory.  He is more irritable.  His blood pressure has been more elevated consistently and he was recently started by his primary care on losartan.  Weight has  been fairly stable.  He has reduced his caffeine intake.  He tries to hydrate well with water.  Of note, he has not had an eye examination in over 3 years and he does feel that his prescription is changed.  He had a head CT without contrast on 07/14/2019 and I reviewed the results: IMPRESSION: No focal acute intracranial abnormality identified.  He had a recent brain MRI without contrast through [facility] on 10/13/2019 and I was able to review the results in care everywhere: IMPRESSION: Normal MRI of the brain without contrast.   Of note, he was hospitalized with COVID-pneumonia in October 2020.  I reviewed the hospital records from his admission from 07/25/2019 through 07/27/2019 at [facility] [place].  He presented with shortness of breath and cough and exposure to COVID-19.  He had developed a severe headache.  He had initially presented to the emergency room on 07/14/2019 with the symptoms and had a head CT, he was not hypoxic at the time and was placed on outpatient steroid treatment.  He developed worsening cough and chest congestion and was noted to be hypoxic. He was treated for pneumonia with Levaquin, he was outside the window for remdesivir.  I had evaluated him over 4 years ago for sleep apnea concerns.  He had a baseline sleep study on 08/05/2017 which showed rather mild obstructive sleep apnea with an AHI of 6.1/h, O2 nadir was 87%.  He  was advised to consider AutoPap therapy.  He indicated preference to go through with dentistry with an oral appliance.  Previously:   06/26/17: 61 year old right-handed gentleman with an underlying medical history of allergies, diverticulitis with status post partial colectomy in 2015, ET, hyperlipidemia, reflux disease, mitral valve prolapse, history of kidney stones, history of ocular migraines, and overweight state, who was previously diagnosed several years ago while in [place], with mild to borderline obstructive sleep apnea for  which he tried CPAP. He did not think it helped and could not sleep with it. He stopped it after a month. He reports recurrent headaches for the past few months. He has also noticed tooth grinding and has started using a mouth guard which has helped a little bit. He has a history of snoring and has woken up in the middle of the night with headaches. His Epworth sleepiness score is 4 out of 24, fatigue score is 22 out of 63. He is married and lives with his wife and 42 year old daughter. He has 4 children, 47 yo son, twins (son and daughter) and 81 yo adoptive daughter. He is a nonsmoker and drinks alcohol occasionally, 4-6 times per month, does not use illicit drugs and does not drink caffeine on a day-to-day basis. He had L spine surgery 2002. He does not have residual pain. He still has migraines, mostly visual auras. His triggers are sleep deprivation and dehydration, changing time zones. He travels about once a month for his work. He has a lot of overseas travels. He does not sleep well. He has multiple nighttime awakenings. Nocturia is about 2-3 times per average night. His snoring is reportedly loud at times and disturbing to his wife. He has restless leg symptoms. He also has bilateral shoulder pain, left more than right. He has been on clonazepam at night for sleep for some time, several years. He used to be on 0.25 mg at night, currently 0.5 mg at night. It also seems to help his restless leg symptoms. His oldest son had restless leg symptoms and also took clonazepam for some time for it.  His Past Medical History Is Significant For: Past Medical History:  Diagnosis Date   Allergy    Blood in stool    Diverticulitis    GERD (gastroesophageal reflux disease)    CONTROLLED WITH DIET - NO MEDS   Heart murmur    MVP - NEVER CAUSES ANY PROBLEMS; PT VERY ACTIVE - CAN PLAY RACKETBALL FOR COUPLE OF HOURS AND NEVER ANY CHEST C/O   History of kidney stones    Hyperlipidemia    Migraines    OCULAR  MIGRAINES   Sleep apnea    STATES SLEEP STUDY 4 OR 5 YEARS AGO - TOLD BORDERLINE - TRIED CPAP FOR 30 DAYS - STATES IT DID NOT HELP - NO LONGER USES.    His Past Surgical History Is Significant For: Past Surgical History:  Procedure Laterality Date   APPENDECTOMY     COLON SURGERY     LAPAROSCOPIC PARTIAL COLECTOMY N/A 06/24/2014   Procedure: LAPAROSCOPIC ASSISTED  PARTIAL COLECTOMY;  Surgeon: Jackolyn Confer, MD;  Location: [facility];  Service: General;  Laterality: N/A;   NASAL RECONSTRUCTION WITH SEPTAL REPAIR     SPINE SURGERY  2001   rupture L4-L5   VASECTOMY      His Family History Is Significant For: Family History  Problem Relation Gordon of Onset   Arthritis Mother    Hyperlipidemia Mother    Stroke Mother  Hypertension Mother    Heart disease Mother        valvular heart disease ? which valve.   Cancer Father 74       prostate   Hypertension Father    Cancer Maternal Grandmother        ovarian   Stroke Paternal Grandfather     His Social History Is Significant For: Social History   Socioeconomic History   Marital status: Married    Spouse name: Not on file   Number of children: Not on file   Years of education: Not on file   Highest education level: Not on file  Occupational History   Not on file  Tobacco Use   Smoking status: Never   Smokeless tobacco: Never  Substance and Sexual Activity   Alcohol use: Yes    Comment: RARE ALCOHOL   Drug use: No   Sexual activity: Not on file  Other Topics Concern   Not on file  Social History Narrative   Caffeine: 1 cup/day   Social Determinants of Health   Financial Resource Strain: Not on file  Food Insecurity: Not on file  Transportation Needs: Not on file  Physical Activity: Not on file  Stress: Not on file  Social Connections: Not on file    His Allergies Are:  Allergies  Allergen Reactions   Biaxin [Clarithromycin] Other (See Comments)    "went right through me"  :   His Current Medications Are:   Outpatient Encounter Medications as of 10/26/2021  Medication Sig   albuterol (VENTOLIN HFA) 108 (90 Base) MCG/ACT inhaler Inhale 1-2 puffs into the lungs every 4 (four) hours as needed for wheezing or shortness of breath.   hydrochlorothiazide (HYDRODIURIL) 25 MG tablet Take 25 mg by mouth every morning.    loratadine (CLARITIN) 10 MG tablet Take 10 mg by mouth at bedtime.   losartan (COZAAR) 50 MG tablet Take 50 mg by mouth daily.   Multiple Vitamin (MULTIVITAMIN WITH MINERALS) TABS tablet Take 1 tablet by mouth every morning.   pantoprazole (PROTONIX) 20 MG tablet Take 20 mg by mouth every morning.   propranolol (INDERAL) 20 MG tablet TAKE 1 TABLET DAILY (Patient taking differently: Take 20 mg by mouth every morning.)   rosuvastatin (CRESTOR) 10 MG tablet Take 10 mg by mouth at bedtime.   [DISCONTINUED] cetirizine (ZYRTEC) 10 MG tablet Take 10 mg by mouth at bedtime.   [DISCONTINUED] Melatonin 10 MG TABS Take 10 mg by mouth at bedtime. (Patient not taking: Reported on 10/26/2021)   No facility-administered encounter medications on file as of 10/26/2021.  :   Review of Systems:  Out of a complete 14 point review of systems, all are reviewed and negative with the exception of these symptoms as listed below:  Review of Systems  Neurological:        Patient is here for a consult for migraines and dizziness. Patient is waking up in the  middle of the night with headaches that run up each side of his spine into the back of his head. He states he has muscular issues, takes Ibuprofen, wakes up around 5 AM and can't go back to sleep. He falls asleep on the couch around 9 PM. He feels he sleeps well between 10 and around 2 AM. There was a stretch of time he was taking Ibuprofen 800 mg every night. He doesn't sleep well. Now has to sleep in a different room. He states his MRI came back negative. His wife has  noticed his memory is off, simple things he forgets. She states his BP is up. He had two  episodes of feeling faint, ?dizzy spell. The second time he was sitting in his chair and he couldn't see anything with clarity. It made him a little bit lightheaded. Lasted 15-20 seconds then goes away. Room doesn't spin. Feels pressure in the back of his eyes.He has a h/o borderline sleep apnea. He used a mouthguard but didn't like it. His wife said his snoring is bothersome, he stops breathing in his sleep. She states he wakes up during the night when he stops breathing. The pain is sharp. His wife states the hardest he snores is during the earlier hours. He has had ocular migraines for years.    Objective:  Neurological Exam  Physical Exam Physical Examination:   Vitals:   10/26/21 0946  BP: 131/85  Pulse: 68   On orthostatic testing: HR supine (x 5 minutes)    --    61 BP sitting    --    134/88 HR sitting    --    65 BP standing (after 1 minute)    --    126/84 HR standing (after 1 minute)    --    67 BP standing (after 3 minutes)    --    147/102 HR standing (after 3 minutes)    --    71  He has no orthostatic lightheadedness or vertiginous symptoms.  General Examination: The patient is a very pleasant 61 y.o. male in no acute distress. He appears well-developed and well-nourished and well groomed.   HEENT: Normocephalic, atraumatic, pupils are equal, round and reactive to light.  He has corrective eye glasses.  Extraocular tracking is good without limitation to gaze excursion or nystagmus noted. Normal smooth pursuit is noted. Hearing is grossly intact. Face is symmetric with normal facial animation and normal facial sensation. Speech is clear with no dysarthria noted. There is no hypophonia. There is a mild intermittent head tremor.   Neck is supple with full range of passive and active motion. There are no carotid bruits on auscultation. Oropharynx exam reveals: mild mouth dryness, good dental hygiene and moderate airway crowding. Tongue protrudes centrally and palate elevates  symmetrically.    Chest: Clear to auscultation without wheezing, rhonchi or crackles noted.   Heart: S1+S2+0, regular and normal without murmurs, rubs or gallops noted.    Abdomen: Soft, non-tender and non-distended with normal bowel sounds appreciated on auscultation.   Extremities: There is no pitting edema in the distal lower extremities bilaterally.   Skin: Warm and dry without trophic changes noted.   Musculoskeletal: exam reveals no obvious joint deformities.    Neurologically:  Mental status: The patient is awake, alert and oriented in all 4 spheres. His immediate and remote memory, attention, language skills and fund of knowledge are appropriate. There is no evidence of aphasia, agnosia, apraxia or anomia. Speech is clear with normal prosody and enunciation. Thought process is linear. Mood is normal and affect is normal.  Cranial nerves II - XII are as described above under HEENT exam. In addition: shoulder shrug is normal with equal shoulder height noted. Motor exam: Normal bulk, strength and tone is noted. There is no drift. No resting tremor. He has a mild bilateral upper extremity postural and action tremor. Romberg is negative. Reflexes are 1+ throughout, including ankles, toes are downgoing bilaterally. Fine motor skills and coordination: intact with normal finger taps, normal hand movements, normal rapid  alternating patting, normal foot taps and normal foot agility.  Cerebellar testing: No dysmetria or intention tremor on finger to nose testing. Heel to shin is unremarkable bilaterally. There is no truncal or gait ataxia.  Sensory exam: intact to light touch in the upper and lower extremities.  Gait, station and balance: He stands easily. No veering to one side is noted. No leaning to one side is noted. Posture is Gordon-appropriate and stance is narrow based. Gait shows normal stride length and normal pace. No problems turning are noted.    Assessment and Plan:    In summary,  Tim Gordon is a very pleasant 61 year old right-handed gentleman with an underlying medical history of reflux disease, kidney stones, migraine headaches, hyperlipidemia, allergies, essential tremor, diverticulitis, mild sleep apnea and overweight state, who presents for evaluation of his dizzy spells and history of headaches and Hx of sleep apnea.  He has had 2 distinct dizzy spells which did not have any association with neurological accompaniments at the time, in particular, no migraine attack, no strokelike symptoms, no vertigo either.  Blood pressure fluctuation is a possibility.  He has not been sleeping well, sleep apnea worsening is also a distinct possibility.  He has had worsening of his essential tremor.  We talked about his different issues at length today.  This was an extended visit.  He is advised to proceed with evaluation of his sleep apnea with a repeat sleep study.  He could not tolerate an oral appliance in the past.  He has woken up with headaches in the middle of the night and mornings.  Tension headache is also a possible contributor.  He does not endorse any telltale stress but for sleep consolidation and sleep deprivation can be an added stressor.  He is advised to stay well-hydrated with water.  He can continue with propranolol but I have suggested we increase this for migraine prevention and for symptomatic control of his essential tremor to 20 mg twice daily at this time.  We may consider switching him to long-acting 60 mg daily. Recent brain MRI without contrast was benign.  Neurological exam is nonfocal and they are reassured today.  He has a known tremor. No orthostatic hypotension.  No findings in keeping with vertigo.  We will plan to follow-up after sleep testing.  We will keep him posted as to his test results by phone call in the interim.  I increased his propranolol prescription. I answered all their questions today and the patient and his wife were in agreement.  Thank  you very much for allowing me to participate in the care of this nice patient. If I can be of any further assistance to you please do not hesitate to call me at [phone].   Sincerely,     Tim Age, MD, PhD I spent 60 minutes in total face-to-face time and in reviewing records during pre-charting, more than 50% of which was spent in counseling and coordination of care, reviewing test results, reviewing medications and treatment regimen and/or in discussing or reviewing the diagnosis of OSA, recurrent HAs, dizziness, ET, the prognosis and treatment options. Pertinent laboratory and imaging test results that were available during this visit with the patient were reviewed by me and considered in my medical decision making (see chart for details).

## 2021-10-26 NOTE — Patient Instructions (Addendum)
It was nice to see you again today.  I am sorry to hear that you are having more problems especially with dizziness.  Your dizziness is unlikely from vertigo.  I do believe that you have symptoms have worsened.  I would like for you to continue further evaluation with another sleep study.  We may consider AutoPap at this time.  As discussed, for your ocular migraines and also for worsening tremor, I recommend that you increase your propranolol to 20 mg twice daily. I have changed your prescription in that regard.  We will call you to schedule your sleep study.  Your Neurological exam is benign and reassuring.  You had a recent brain MRI as well.

## 2021-11-07 ENCOUNTER — Other Ambulatory Visit: Payer: Self-pay

## 2021-11-07 ENCOUNTER — Ambulatory Visit: Payer: BC Managed Care – PPO | Admitting: Family Medicine

## 2021-11-07 ENCOUNTER — Encounter: Payer: Self-pay | Admitting: Family Medicine

## 2021-11-07 VITALS — BP 138/86 | HR 62 | Temp 97.5°F | Ht 69.0 in | Wt 199.0 lb

## 2021-11-07 DIAGNOSIS — I10 Essential (primary) hypertension: Secondary | ICD-10-CM

## 2021-11-07 DIAGNOSIS — R3916 Straining to void: Secondary | ICD-10-CM

## 2021-11-07 DIAGNOSIS — K4090 Unilateral inguinal hernia, without obstruction or gangrene, not specified as recurrent: Secondary | ICD-10-CM

## 2021-11-07 DIAGNOSIS — Z23 Encounter for immunization: Secondary | ICD-10-CM | POA: Diagnosis not present

## 2021-11-07 DIAGNOSIS — L989 Disorder of the skin and subcutaneous tissue, unspecified: Secondary | ICD-10-CM

## 2021-11-07 DIAGNOSIS — R3912 Poor urinary stream: Secondary | ICD-10-CM | POA: Diagnosis not present

## 2021-11-07 DIAGNOSIS — N401 Enlarged prostate with lower urinary tract symptoms: Secondary | ICD-10-CM

## 2021-11-07 DIAGNOSIS — E782 Mixed hyperlipidemia: Secondary | ICD-10-CM

## 2021-11-07 NOTE — Progress Notes (Signed)
Tim Gordon - 61 y.o. male MRN [id number]  Date of birth: [birth year]  Subjective Chief Complaint  Patient presents with   Establish Care    HPI Tim Gordon is a 61 year old male here today for initial visit to establish care.  He has history of hypertension that has been well controlled with hydrochlorothiazide and losartan.  He denies side effects related to these medications.  Hyperlipidemia managed with rosuvastatin which she is tolerating well.  He has upcoming surgery on his left knee for meniscal repair.  He thinks he may have a inguinal hernia as well.  Having increased pain and bulge in the right groin area.  Bowels are moving normally.  Pain is worse when lifting, pushing or pulling.  He has a nonhealing skin lesion on his upper lip that has been present for approximately 1 year.  Previously seen by dermatology in [place] but would like to see someone closer.  Has had issues with weak urinary stream.  Seen by urologist in the past and was prescribed Flomax however had side effects with this.  Has not had any further evaluation since that time.  ROS:  A comprehensive ROS was completed and negative except as noted per HPI  Allergies  Allergen Reactions   Flomax [Tamsulosin]     Dizziness, felt "funny"   Biaxin [Clarithromycin] Diarrhea    Past Medical History:  Diagnosis Date   Allergy    Blood in stool    Diverticulitis    GERD (gastroesophageal reflux disease)    CONTROLLED WITH DIET - NO MEDS   Heart murmur    MVP - NEVER CAUSES ANY PROBLEMS; PT VERY ACTIVE - CAN PLAY RACKETBALL FOR COUPLE OF HOURS AND NEVER ANY CHEST C/O   History of kidney stones    Hyperlipidemia    Migraines    OCULAR MIGRAINES   Sleep apnea    STATES SLEEP STUDY 4 OR 5 YEARS AGO - TOLD BORDERLINE - TRIED CPAP FOR 30 DAYS - STATES IT DID NOT HELP - NO LONGER USES.    Past Surgical History:  Procedure Laterality Date   APPENDECTOMY     COLON SURGERY     LAPAROSCOPIC PARTIAL COLECTOMY  N/A 06/24/2014   Procedure: LAPAROSCOPIC ASSISTED  PARTIAL COLECTOMY;  Surgeon: Jackolyn Confer, MD;  Location: [facility];  Service: General;  Laterality: N/A;   NASAL RECONSTRUCTION WITH SEPTAL REPAIR     SPINE SURGERY  2001   rupture L4-L5   VASECTOMY      Social History   Socioeconomic History   Marital status: Married    Spouse name: Not on file   Number of children: Not on file   Years of education: Not on file   Highest education level: Not on file  Occupational History   Not on file  Tobacco Use   Smoking status: Never    Passive exposure: Never   Smokeless tobacco: Never  Vaping Use   Vaping Use: Never used  Substance and Sexual Activity   Alcohol use: Yes    Comment: RARE ALCOHOL   Drug use: No   Sexual activity: Yes    Partners: Female  Other Topics Concern   Not on file  Social History Narrative   Caffeine: 1 cup/day   Social Determinants of Health   Financial Resource Strain: Not on file  Food Insecurity: Not on file  Transportation Needs: Not on file  Physical Activity: Not on file  Stress: Not on file  Social Connections: Not on file  Family History  Problem Relation Age of Onset   Arthritis Mother    Hyperlipidemia Mother    Stroke Mother    Hypertension Mother    Heart disease Mother        valvular heart disease ? which valve.   Cancer Father 20       prostate   Hypertension Father    Cancer Maternal Grandmother        ovarian   Stroke Paternal Grandfather     Health Maintenance  Topic Date Due   COVID-19 Vaccine (5 - Booster for Moderna series) 09/21/2021   Zoster Vaccines- Shingrix (2 of 2) 01/02/2022   COLONOSCOPY (Pts 45-6yrs Insurance coverage will need to be confirmed)  09/26/2023   TETANUS/TDAP  07/28/2031   INFLUENZA VACCINE  Completed   Hepatitis C Screening  Completed   HIV Screening  Completed   HPV VACCINES  Aged Out      ----------------------------------------------------------------------------------------------------------------------------------------------------------------------------------------------------------------- Physical Exam BP 138/86 (BP Location: Left Arm, Patient Position: Sitting, Cuff Size: Normal)    Pulse 62    Temp (!) 97.5 F (36.4 C)    Ht 5\' 9"  (1.753 m)    Wt 199 lb (90.3 kg)    SpO2 100%    BMI 29.39 kg/m   Physical Exam Constitutional:      Appearance: Normal appearance.  Eyes:     General: No scleral icterus. Cardiovascular:     Rate and Rhythm: Normal rate and regular rhythm.  Pulmonary:     Effort: Pulmonary effort is normal.     Breath sounds: Normal breath sounds.  Abdominal:     General: Bowel sounds are normal.     Palpations: Abdomen is soft.     Hernia: A hernia (Right inguinal.  Nontender.) is present.  Musculoskeletal:     Cervical back: Neck supple.  Neurological:     General: No focal deficit present.     Mental Status: He is alert.  Psychiatric:        Mood and Affect: Mood normal.        Behavior: Behavior normal.    ------------------------------------------------------------------------------------------------------------------------------------------------------------------------------------------------------------------- Assessment and Plan  Essential hypertension Blood pressure remains fairly well controlled current medications.  Recommend he continue current medications for management of his hypertension.  BPH (benign prostatic hyperplasia) History of BPH and weak urinary stream.  Recommend referral back to urology due to continued symptoms and intolerance of tamsulosin.  Hyperlipidemia Tolerating rosuvastatin well.  Recommend continuation at this time.  Right inguinal hernia Right inguinal hernia present.  Referral placed to general surgery to discuss repair of this.  Skin lesion of face Nonhealing skin lesion of the upper lip.   He is requesting referral to a new dermatologist, referral entered.   No orders of the defined types were placed in this encounter.   Return in about 6 months (around 05/07/2022) for HTN.    This visit occurred during the SARS-CoV-2 public health emergency.  Safety protocols were in place, including screening questions prior to the visit, additional usage of staff PPE, and extensive cleaning of exam room while observing appropriate contact time as indicated for disinfecting solutions.

## 2021-11-07 NOTE — Assessment & Plan Note (Signed)
Tolerating rosuvastatin well.  Recommend continuation at this time.

## 2021-11-07 NOTE — Patient Instructions (Addendum)
Great to meet you today! Continue current medications You should be contacted about referrals.  See me again in 6 months or sooner if needed.

## 2021-11-07 NOTE — Assessment & Plan Note (Signed)
Blood pressure remains fairly well controlled current medications.  Recommend he continue current medications for management of his hypertension.

## 2021-11-07 NOTE — Assessment & Plan Note (Signed)
Nonhealing skin lesion of the upper lip.  He is requesting referral to a new dermatologist, referral entered.

## 2021-11-07 NOTE — Assessment & Plan Note (Signed)
History of BPH and weak urinary stream.  Recommend referral back to urology due to continued symptoms and intolerance of tamsulosin.

## 2021-11-07 NOTE — Assessment & Plan Note (Signed)
Right inguinal hernia present.  Referral placed to general surgery to discuss repair of this.

## 2021-11-15 DIAGNOSIS — M94262 Chondromalacia, left knee: Secondary | ICD-10-CM | POA: Diagnosis not present

## 2021-11-15 DIAGNOSIS — X58XXXA Exposure to other specified factors, initial encounter: Secondary | ICD-10-CM | POA: Diagnosis not present

## 2021-11-15 DIAGNOSIS — G8918 Other acute postprocedural pain: Secondary | ICD-10-CM | POA: Diagnosis not present

## 2021-11-15 DIAGNOSIS — Y999 Unspecified external cause status: Secondary | ICD-10-CM | POA: Diagnosis not present

## 2021-11-15 DIAGNOSIS — S83232A Complex tear of medial meniscus, current injury, left knee, initial encounter: Secondary | ICD-10-CM | POA: Diagnosis not present

## 2021-11-16 ENCOUNTER — Telehealth: Payer: Self-pay | Admitting: Neurology

## 2021-11-16 NOTE — Telephone Encounter (Signed)
I called the pharmacy and spoke with [facility] and we were able to confirm the rx will be shipped on 11/21/21 as the propranolol 20 mg bid.   I called pt and relayed the message, pt verbalized understanding and appreciation for the call.

## 2021-11-16 NOTE — Telephone Encounter (Signed)
I called the pt's wife back and she sts the pt is having difficulty getting the propranolol filled by [facility]. Reports they are trying to fill the propranolol 20 mg 1 tab daily.   Advised I would call pharmacy to see about this and provide correct information.

## 2021-11-16 NOTE — Telephone Encounter (Signed)
Pt's wife has called to report that the Rx that was written on 01-19th has not been released from the pharmacy yet.  Wife is asking for a  call from RN re: the now twice a day propranolol (INDERAL) 20 MG tablet 90 day Rx thru [facility]

## 2021-11-24 DIAGNOSIS — K4090 Unilateral inguinal hernia, without obstruction or gangrene, not specified as recurrent: Secondary | ICD-10-CM | POA: Diagnosis not present

## 2021-11-27 ENCOUNTER — Ambulatory Visit (INDEPENDENT_AMBULATORY_CARE_PROVIDER_SITE_OTHER): Payer: BC Managed Care – PPO | Admitting: Neurology

## 2021-11-27 DIAGNOSIS — G250 Essential tremor: Secondary | ICD-10-CM

## 2021-11-27 DIAGNOSIS — R42 Dizziness and giddiness: Secondary | ICD-10-CM

## 2021-11-27 DIAGNOSIS — R0683 Snoring: Secondary | ICD-10-CM

## 2021-11-27 DIAGNOSIS — G4733 Obstructive sleep apnea (adult) (pediatric): Secondary | ICD-10-CM

## 2021-11-27 DIAGNOSIS — R454 Irritability and anger: Secondary | ICD-10-CM

## 2021-11-27 DIAGNOSIS — R419 Unspecified symptoms and signs involving cognitive functions and awareness: Secondary | ICD-10-CM

## 2021-11-27 DIAGNOSIS — G43109 Migraine with aura, not intractable, without status migrainosus: Secondary | ICD-10-CM

## 2021-11-27 DIAGNOSIS — R519 Headache, unspecified: Secondary | ICD-10-CM

## 2021-11-29 NOTE — Procedures (Signed)
°  ° °  [facility]  HOME SLEEP TEST (Watch PAT) REPORT  STUDY DATE: 11/27/2021  DOB: [birth year]  MRN: [id number]  ORDERING CLINICIAN: [name], MD, PhD   REFERRING CLINICIAN: Dr. Rene Kocher.  CLINICAL INFORMATION/HISTORY: 61 year old right-handed gentleman with an underlying medical history of reflux disease, kidney stones, migraine headaches, hyperlipidemia, allergies, essential tremor, diverticulitis, mild sleep apnea and overweight state, who presents for reevaluation of his sleep apnea.  He has had worsening snoring and intermittent dizziness.  Epworth sleepiness score: 4/24.  BMI: 29.4 kg/m  FINDINGS:   Sleep Summary:   Total Recording Time (hours, min): 7 hours, 41 minutes  Total Sleep Time (hours, min):  6 hours, 28 minutes   Percent REM (%):    24.6%   Respiratory Indices:   Calculated pAHI (per hour):  32.4/hour         REM pAHI:    28.4/hour       NREM pAHI: 33.7/hour  Oxygen Saturation Statistics:    Oxygen Saturation (%) Mean: 93%   Minimum oxygen saturation (%):                 86%   O2 Saturation Range (%): 86-98%    O2 Saturation (minutes) <=88%: 0.3 min  Pulse Rate Statistics:   Pulse Mean (bpm):    61/min    Pulse Range (52-109/min)   IMPRESSION: OSA (obstructive sleep apnea)   RECOMMENDATION:  This home sleep test demonstrates severe obstructive sleep apnea with a total AHI of 32.4/hour and O2 nadir of 86%.  Intermittent mild to moderate snoring was detected.  Treatment with positive airway pressure is recommended. The patient will be advised to proceed with an autoPAP titration/trial at home.  A laboratory attended titration study can be considered in the future for optimization of his treatment and better tolerance of therapy.  Alternative treatment options may include a dental device or surgical option with a hypoglossal nerve stimulator.  Concomitant weight loss may aid in reducing his sleep apnea and snoring.  Please  note, that untreated obstructive sleep apnea may carry additional perioperative morbidity. Patients with significant obstructive sleep apnea should receive perioperative PAP therapy and the surgeons and particularly the anesthesiologist should be informed of the diagnosis and the severity of the sleep disordered breathing. The patient should be cautioned not to drive, work at heights, or operate dangerous or heavy equipment when tired or sleepy. Review and reiteration of good sleep hygiene measures should be pursued with any patient. Other causes of the patient's symptoms, including circadian rhythm disturbances, an underlying mood disorder, medication effect and/or an underlying medical problem cannot be ruled out based on this test. Clinical correlation is recommended. The patient and his referring provider will be notified of the test results. The patient will be seen in follow up in sleep clinic at [facility].  I certify that I have reviewed the raw data recording prior to the issuance of this report in accordance with the standards of the American Academy of Sleep Medicine (AASM).  INTERPRETING PHYSICIAN:   [name], MD, PhD  Board Certified in Neurology and Sleep Medicine  [facility] [address], [place], [place] [address] [phone]

## 2021-11-29 NOTE — Progress Notes (Signed)
°  ° °  [facility]  HOME SLEEP TEST (Watch PAT) REPORT  STUDY DATE: 11/27/2021  DOB: 13-Dec-1960  MRN: [id number]  ORDERING CLINICIAN: [name], MD, PhD   REFERRING CLINICIAN: Dr. Rene Kocher.  CLINICAL INFORMATION/HISTORY: 61 year old right-handed gentleman with an underlying medical history of reflux disease, kidney stones, migraine headaches, hyperlipidemia, allergies, essential tremor, diverticulitis, mild sleep apnea and overweight state, who presents for reevaluation of his sleep apnea.  He has had worsening snoring and intermittent dizziness.  Epworth sleepiness score: 4/24.  BMI: 29.4 kg/m  FINDINGS:   Sleep Summary:   Total Recording Time (hours, min): 7 hours, 41 minutes  Total Sleep Time (hours, min):  6 hours, 28 minutes   Percent REM (%):    24.6%   Respiratory Indices:   Calculated pAHI (per hour):  32.4/hour         REM pAHI:    28.4/hour       NREM pAHI: 33.7/hour  Oxygen Saturation Statistics:    Oxygen Saturation (%) Mean: 93%   Minimum oxygen saturation (%):                 86%   O2 Saturation Range (%): 86-98%    O2 Saturation (minutes) <=88%: 0.3 min  Pulse Rate Statistics:   Pulse Mean (bpm):    61/min    Pulse Range (52-109/min)   IMPRESSION: OSA (obstructive sleep apnea)   RECOMMENDATION:  This home sleep test demonstrates severe obstructive sleep apnea with a total AHI of 32.4/hour and O2 nadir of 86%.  Intermittent mild to moderate snoring was detected.  Treatment with positive airway pressure is recommended. The patient will be advised to proceed with an autoPAP titration/trial at home.  A laboratory attended titration study can be considered in the future for optimization of his treatment and better tolerance of therapy.  Alternative treatment options may include a dental device or surgical option with a hypoglossal nerve stimulator.  Concomitant weight loss may aid in reducing his sleep apnea and snoring.  Please  note, that untreated obstructive sleep apnea may carry additional perioperative morbidity. Patients with significant obstructive sleep apnea should receive perioperative PAP therapy and the surgeons and particularly the anesthesiologist should be informed of the diagnosis and the severity of the sleep disordered breathing. The patient should be cautioned not to drive, work at heights, or operate dangerous or heavy equipment when tired or sleepy. Review and reiteration of good sleep hygiene measures should be pursued with any patient. Other causes of the patient's symptoms, including circadian rhythm disturbances, an underlying mood disorder, medication effect and/or an underlying medical problem cannot be ruled out based on this test. Clinical correlation is recommended. The patient and his referring provider will be notified of the test results. The patient will be seen in follow up in sleep clinic at [facility].  I certify that I have reviewed the raw data recording prior to the issuance of this report in accordance with the standards of the American Academy of Sleep Medicine (AASM).  INTERPRETING PHYSICIAN:   [name], MD, PhD  Board Certified in Neurology and Sleep Medicine  [facility] [address], [place], [place] [address] [phone]

## 2021-11-29 NOTE — Addendum Note (Signed)
Addended by: [name] on: 11/29/2021 05:58 PM   Modules accepted: Orders

## 2021-11-30 ENCOUNTER — Telehealth: Payer: Self-pay

## 2021-11-30 NOTE — Telephone Encounter (Signed)
-----   Message from [name], MD sent at 11/29/2021  5:58 PM EST ----- I saw Tim Gordon's dad recently for intermittent dizziness on 10/26/2021 and we also talked about his previously mild sleep apnea.  We mutually agreed to reevaluate him.  He had a home sleep test on 11/27/2021 which did indicate worse sleep apnea compared to before.  It is currently in the severe range.  I would recommend treatment with AutoPap therapy.  I have seen patients with dizziness and headaches improve when we treated their sleep apnea.  If he is okay with this approach, we will go ahead and send an order for AutoPap therapy to our local DME company and have him follow-up in clinic in about 1 to 3 months after set up.

## 2021-11-30 NOTE — Telephone Encounter (Signed)
I called pt. I advised pt that Dr. Rexene Alberts reviewed their sleep study results and found that pt has severe osa. Dr. Rexene Alberts recommends that pt start an auto pap at home. I reviewed PAP compliance expectations with the pt. Pt is agreeable to starting an auto-PAP. I advised pt that an order will be sent to a DME, [facility], and [facility] will call the pt within about one week after they file with the pt's insurance. [facility] will show the pt how to use the machine, fit for masks, and troubleshoot the auto-PAP if needed. A follow up appt was made for insurance purposes with Dr. Rexene Alberts on 03/06/2022 at 7:45am. Pt verbalized understanding to arrive 15 minutes early and bring their auto-PAP. A letter with all of this information in it will be mailed to the pt as a reminder. I verified with the pt that the address we have on file is correct. Pt verbalized understanding of results. Pt had no questions at this time but was encouraged to call back if questions arise. I have sent the order to [facility] and have received confirmation that they have received the order.

## 2021-12-02 ENCOUNTER — Encounter: Payer: Self-pay | Admitting: Neurology

## 2021-12-06 DIAGNOSIS — G4733 Obstructive sleep apnea (adult) (pediatric): Secondary | ICD-10-CM | POA: Diagnosis not present

## 2021-12-10 ENCOUNTER — Encounter: Payer: Self-pay | Admitting: Family Medicine

## 2021-12-11 MED ORDER — LOSARTAN POTASSIUM 50 MG PO TABS: ORAL_TABLET | ORAL | 1 refills | Status: DC

## 2021-12-19 ENCOUNTER — Other Ambulatory Visit: Payer: Self-pay | Admitting: Surgery

## 2021-12-19 DIAGNOSIS — K4090 Unilateral inguinal hernia, without obstruction or gangrene, not specified as recurrent: Secondary | ICD-10-CM | POA: Diagnosis not present

## 2021-12-24 ENCOUNTER — Encounter: Payer: Self-pay | Admitting: Family Medicine

## 2021-12-25 ENCOUNTER — Telehealth: Payer: Self-pay | Admitting: Neurology

## 2021-12-25 MED ORDER — HYDROCHLOROTHIAZIDE 25 MG PO TABS: 25.0000 mg | ORAL_TABLET | Freq: Every morning | ORAL | 3 refills | Status: DC

## 2021-12-25 NOTE — Telephone Encounter (Signed)
I called pt.  He states that he started using Autopap 12-06-2021, has felt sleeping more soundly, memory better.  He has noted that when he takes off mask, when wakes up has soreness inbetween ribs on both sides of sternum.  He is using resmed airsense autoset 5/10cm. He will be having surgery [facility] 12-28-2021, had cold last week. This is better now.  ? If the autopap is increasing lung capacity (causing soreness)?  His DL attached.  ?

## 2021-12-25 NOTE — Telephone Encounter (Signed)
I reviewed his download, his average pressure is around 7 which is on the lower end of the spectrum.  His sleep apnea is under good control.  Using AutoPap typically does not change the lung capacity or cause soreness.  These symptoms may have to do with his recent cold and likely independent of AutoPap.  I would like to encourage him to continue with the current treatment at the current settings. ?

## 2021-12-25 NOTE — Telephone Encounter (Signed)
I called pt back and relayed Dr. Tori Milks note.  He was appreciative of information and verbalized understanding.  Encouraged continued usage.  He will call back as needed.  ?

## 2021-12-25 NOTE — Telephone Encounter (Signed)
Pt left a vm at 11:45 stating that with his new CPAP he has a cold now and a heavy feeling in his chest, pt stated it is light but something he wants to discuss all the same, please call. ?

## 2021-12-25 NOTE — Pre-Procedure Instructions (Signed)
Surgical Instructions ? ? ? Your procedure is scheduled on Thursday, March 23 ? Report to [facility] Main Entrance "A" at 8:30 A.M., then check in with the Admitting office. ? Call this number if you have problems the morning of surgery: ? [phone] ? ? If you have any questions prior to your surgery date call [phone]: Open Monday-Friday 8am-4pm ? ? ? Remember: ? Do not eat after midnight the night before your surgery ? ?You may drink clear liquids until 7:30 AM the morning of your surgery.   ?Clear liquids allowed are: Water, Non-Citrus Juices (without pulp), Carbonated Beverages, Clear Tea, Black Coffee ONLY (NO MILK, CREAM OR POWDERED CREAMER of any kind), and Gatorade ?  ? Take these medicines the morning of surgery with A SIP OF WATER:  ? ?propranolol (INDERAL)  ?pantoprazole (PROTONIX)  ?albuterol (VENTOLIN HFA) 108 (90 Base) MCG/ACT inhaler- please bring inhaler to hospital with you ? ?As of today, STOP taking any Aspirin (unless otherwise instructed by your surgeon) Aleve, Naproxen, Ibuprofen, Motrin, Advil, Goody's, BC's, all herbal medications, fish oil, and all vitamins. ? ?         ?Do not wear jewelry or makeup ?Do not wear lotions, powders, perfumes/colognes, or deodorant. ?Do not shave 48 hours prior to surgery.  Men may shave face and neck. ?Do not bring valuables to the hospital. ?Do not wear nail polish, gel polish, artificial nails, or any other type of covering on natural nails (fingers and toes) ?If you have artificial nails or gel coating that need to be removed by a nail salon, please have this removed prior to surgery. Artificial nails or gel coating may interfere with anesthesia's ability to adequately monitor your vital signs. ? ?[facility] is not responsible for any belongings or valuables. .  ? ?Do NOT Smoke (Tobacco/Vaping)  24 hours prior to your procedure ? ?If you use a CPAP at night, you may bring your mask for your overnight stay. ?  ?Contacts, glasses, hearing aids,  dentures or partials may not be worn into surgery, please bring cases for these belongings ?  ?For patients admitted to the hospital, discharge time will be determined by your treatment team. ?  ?Patients discharged the day of surgery will not be allowed to drive home, and someone needs to stay with them for 24 hours. ? ?NO VISITORS WILL BE ALLOWED IN PRE-OP WHERE PATIENTS ARE PREPPED FOR SURGERY.  ONLY 1 SUPPORT PERSON MAY BE PRESENT IN THE WAITING ROOM WHILE YOU ARE IN SURGERY.  IF YOU ARE TO BE ADMITTED, ONCE YOU ARE IN YOUR ROOM YOU WILL BE ALLOWED TWO (2) VISITORS. 1 (ONE) VISITOR MAY STAY OVERNIGHT BUT MUST ARRIVE TO THE ROOM BY 8pm.  Minor children may have two parents present. Special consideration for safety and communication needs will be reviewed on a case by case basis. ? ?Special instructions:   ? ?Oral Hygiene is also important to reduce your risk of infection.  Remember - BRUSH YOUR TEETH THE MORNING OF SURGERY WITH YOUR REGULAR TOOTHPASTE ? ? ?[facility]- Preparing For Surgery ? ?Before surgery, you can play an important role. Because skin is not sterile, your skin needs to be as free of germs as possible. You can reduce the number of germs on your skin by washing with CHG (chlorahexidine gluconate) Soap before surgery.  CHG is an antiseptic cleaner which kills germs and bonds with the skin to continue killing germs even after washing.   ? ? ?Please do not use  if you have an allergy to CHG or antibacterial soaps. If your skin becomes reddened/irritated stop using the CHG.  ?Do not shave (including legs and underarms) for at least 48 hours prior to first CHG shower. It is OK to shave your face. ? ?Please follow these instructions carefully. ?  ? ? Shower the NIGHT BEFORE SURGERY and the MORNING OF SURGERY with CHG Soap.  ? If you chose to wash your hair, wash your hair first as usual with your normal shampoo. After you shampoo, rinse your hair and body thoroughly to remove the shampoo.  Then [employer] and genitals (private parts) with your normal soap and rinse thoroughly to remove soap. ? ?After that Use CHG Soap as you would any other liquid soap. You can apply CHG directly to the skin and wash gently with a scrungie or a clean washcloth.  ? ?Apply the CHG Soap to your body ONLY FROM THE NECK DOWN.  Do not use on open wounds or open sores. Avoid contact with your eyes, ears, mouth and genitals (private parts). Wash Face and genitals (private parts)  with your normal soap.  ? ?Wash thoroughly, paying special attention to the area where your surgery will be performed. ? ?Thoroughly rinse your body with warm water from the neck down. ? ?DO NOT shower/wash with your normal soap after using and rinsing off the CHG Soap. ? ?Pat yourself dry with a CLEAN TOWEL. ? ?Wear CLEAN PAJAMAS to bed the night before surgery ? ?Place CLEAN SHEETS on your bed the night before your surgery ? ?DO NOT SLEEP WITH PETS. ? ? ?Day of Surgery: ? ?Take a shower with CHG soap. ?Wear Clean/Comfortable clothing the morning of surgery ?Do not apply any deodorants/lotions.   ?Remember to brush your teeth WITH YOUR REGULAR TOOTHPASTE. ? ? ? ?Notify your provider: ?if you are in close contact with someone who has COVID  ?or if you develop a fever of 100.4 or greater, sneezing, cough, sore throat, shortness of breath or body aches. ? ?  ?Please read over the following fact sheets that you were given.  ? ?

## 2021-12-26 ENCOUNTER — Other Ambulatory Visit: Payer: Self-pay

## 2021-12-26 ENCOUNTER — Encounter (HOSPITAL_COMMUNITY): Payer: Self-pay

## 2021-12-26 ENCOUNTER — Encounter (HOSPITAL_COMMUNITY)
Admission: RE | Admit: 2021-12-26 | Discharge: 2021-12-26 | Disposition: A | Discharge status: Home / Self Care | Payer: BC Managed Care – PPO | Source: Ambulatory Visit | Attending: Surgery | Admitting: Surgery

## 2021-12-26 VITALS — BP 141/84 | HR 64 | Temp 97.8°F | Resp 17 | Ht 69.0 in | Wt 199.6 lb

## 2021-12-26 DIAGNOSIS — I10 Essential (primary) hypertension: Secondary | ICD-10-CM | POA: Diagnosis not present

## 2021-12-26 DIAGNOSIS — Z01818 Encounter for other preprocedural examination: Secondary | ICD-10-CM | POA: Insufficient documentation

## 2021-12-26 DIAGNOSIS — I498 Other specified cardiac arrhythmias: Secondary | ICD-10-CM

## 2021-12-26 DIAGNOSIS — G4733 Obstructive sleep apnea (adult) (pediatric): Secondary | ICD-10-CM | POA: Insufficient documentation

## 2021-12-26 HISTORY — DX: Essential (primary) hypertension: I10

## 2021-12-26 LAB — CBC
HCT: 45 % (ref 39.0–52.0)
Hemoglobin: 15.7 g/dL (ref 13.0–17.0)
MCH: 31.7 pg (ref 26.0–34.0)
MCHC: 34.9 g/dL (ref 30.0–36.0)
MCV: 90.9 fL (ref 80.0–100.0)
Platelets: 187 10*3/uL (ref 150–400)
RBC: 4.95 MIL/uL (ref 4.22–5.81)
RDW: 12.8 % (ref 11.5–15.5)
WBC: 5.2 10*3/uL (ref 4.0–10.5)
nRBC: 0 % (ref 0.0–0.2)

## 2021-12-26 LAB — BASIC METABOLIC PANEL
Anion gap: 7 (ref 5–15)
BUN: 19 mg/dL (ref 6–20)
CO2: 30 mmol/L (ref 22–32)
Calcium: 9.1 mg/dL (ref 8.9–10.3)
Chloride: 102 mmol/L (ref 98–111)
Creatinine, Ser: 1.03 mg/dL (ref 0.61–1.24)
GFR, Estimated: >60 mL/min (ref >60)
Glucose, Bld: 106 mg/dL — ABNORMAL HIGH (ref 70–99)
Potassium: 3.5 mmol/L (ref 3.5–5.1)
Sodium: 139 mmol/L (ref 135–145)

## 2021-12-26 NOTE — Progress Notes (Signed)
PCP - Dr. Luetta Nutting ?Cardiologist - Dr. Ulyses Southward ? ?PPM/ICD - n/a ?Device Orders - n/a ?Rep Notified - n/a ? ?Chest x-ray - n/a ?EKG - 12/26/21 ?Stress Test - denies ?ECHO - denies ?Cardiac Cath - denies ? ?Sleep Study - +OSA. Home sleep study 11/21/21 ?CPAP - Wears nightly. Pressure settings 5-10 ? ?Fasting Blood Sugar - n/a ?Checks Blood Sugar _____ times a day- n/a ? ?Blood Thinner Instructions: n/a ?Aspirin Instructions: n/a ? ?ERAS Protcol - Yes ?PRE-SURGERY Ensure or G2- n/a. None ordered. ? ?COVID TEST- N/A ? ? ?Anesthesia review: Yes. Atrial Bigeminy listed in history.  Lorretta Harp, PA came over to PAT to assess patient.  ? ?Patient denies shortness of breath, fever, cough and chest pain at PAT appointment ? ? ?All instructions explained to the patient, with a verbal understanding of the material. Patient agrees to go over the instructions while at home for a better understanding. The opportunity to ask questions was provided. ? ? ?

## 2021-12-27 NOTE — Anesthesia Preprocedure Evaluation (Addendum)
Anesthesia Evaluation  ?Patient identified by MRN, date of birth, ID band ?Patient awake ? ? ? ?Reviewed: ?Allergy & Precautions, NPO status , Patient's Chart, lab work & pertinent test results ? ?Airway ?Mallampati: II ? ?TM Distance: >3 FB ?Neck ROM: Full ? ? ? Dental ?no notable dental hx. ? ?  ?Pulmonary ?sleep apnea ,  ?  ?Pulmonary exam normal ?breath sounds clear to auscultation ? ? ? ? ? ? Cardiovascular ?hypertension, Pt. on medications and Pt. on home beta blockers ?Normal cardiovascular exam ?Rhythm:Regular Rate:Normal ? ? ?  ?Neuro/Psych ? Headaches,   ? GI/Hepatic ?Neg liver ROS, GERD  ,  ?Endo/Other  ?negative endocrine ROS ? Renal/GU ?negative Renal ROS  ? ?  ?Musculoskeletal ? ?(+) Arthritis ,  ? Abdominal ?  ?Peds ? Hematology ?negative hematology ROS ?(+)   ?Anesthesia Other Findings ? ? Reproductive/Obstetrics ? ?  ? ? ? ? ? ? ? ? ? ? ? ? ? ?  ?  ? ? ? ? ? ? ?                                  Anesthesia Evaluation  ?Patient identified by MRN, date of birth, ID band ?Patient awake ? ? ? ?Reviewed: ?Allergy & Precautions, H&P , NPO status , Patient's Chart, lab work & pertinent test results, reviewed documented beta blocker date and time  ? ?Airway ?Mallampati: II ?TM Distance: >3 FB ?Neck ROM: Full ? ? ? Dental ?no notable dental hx. ? ?  ?Pulmonary ?sleep apnea ,  ?breath sounds clear to auscultation ? ?Pulmonary exam normal ? ? ? ? ? ? Cardiovascular ?Pt. on home beta blockers ?negative cardio ROS ? ?+ Valvular Problems/Murmurs Rhythm:Regular Rate:Normal ? ? ?  ?Neuro/Psych ? Headaches, negative psych ROS  ? GI/Hepatic ?Neg liver ROS, GERD-  Medicated,  ?Endo/Other  ?negative endocrine ROS ? Renal/GU ?negative Renal ROS  ? ?  ?Musculoskeletal ?negative musculoskeletal ROS ?(+)  ? Abdominal ?  ?Peds ? Hematology ?negative hematology ROS ?(+)   ?Anesthesia Other Findings ? ? Reproductive/Obstetrics ?negative OB ROS ? ?  ? ? ? ? ? ? ? ? ? ? ? ? ? ?  ?   ? ? ? ? ? ? ?Anesthesia Physical ?Anesthesia Plan ? ?ASA: II ? ?Anesthesia Plan: General  ? ?Post-op Pain Management:   ? ?Induction: Intravenous ? ?Airway Management Planned: Oral ETT ? ?Additional Equipment:  ? ?Intra-op Plan:  ? ?Post-operative Plan: Extubation in OR ? ?Informed Consent: I have reviewed the patients History and Physical, chart, labs and discussed the procedure including the risks, benefits and alternatives for the proposed anesthesia with the patient or authorized representative who has indicated his/her understanding and acceptance.  ? ?Dental advisory given ? ?Plan Discussed with: CRNA ? ?Anesthesia Plan Comments:   ? ? ? ? ? ? ?Anesthesia Quick Evaluation ? ?Anesthesia Physical ?Anesthesia Plan ? ?ASA: 2 ? ?Anesthesia Plan: General  ? ?Post-op Pain Management: Tylenol PO (pre-op)*, Celebrex PO (pre-op)* and Minimal or no pain anticipated  ? ?Induction: Intravenous ? ?PONV Risk Score and Plan: 4 or greater and Ondansetron, Dexamethasone, Treatment may vary due to age or medical condition and Midazolam ? ?Airway Management Planned: Oral ETT ? ?Additional Equipment:  ? ?Intra-op Plan:  ? ?Post-operative Plan: Extubation in OR ? ?Informed Consent: I have reviewed the patients History and Physical, chart, labs and discussed the procedure including the risks, benefits  and alternatives for the proposed anesthesia with the patient or authorized representative who has indicated his/her understanding and acceptance.  ? ? ? ?Dental advisory given ? ?Plan Discussed with: CRNA ? ?Anesthesia Plan Comments: (PAT note by Karoline Caldwell, PA-C: ?I evaluated patient at preadmission testing appointment due to equivocal history of murmur.  He reports that he had a cardiology evaluation in 2019 at the request of his PCP for atrial bigeminy seen on EKG.  He was last seen by Dr. Kenton Kingfisher 06/27/2018 at [facility] and was doing well from a cardiac standpoint, no testing was felt needed for evaluation of atrial bigeminy, patient  was noted to be active and asymptomatic.  He was recommended to continue exercise and risk factor reduction and follow-up with cardiology on an as-needed basis. ? ?Patient reports he thinks he may have been told he had a murmur at some point, however he admits he may be confusing this with the diagnosis of atrial bigeminy.  He denies any history of chest pain, shortness of breath, palpitations. His functional status continues to be quite high, although he is currently limited by knee pain.  He previously played racquetball regularly without any cardiopulmonary symptoms.  On exam today, auscultation reveals heart regular rate and rhythm, no murmurs/rubs/gallop.  Lungs CTAB.  Blood pressure reasonably well controlled 141/84.  EKG shows sinus bradycardia at rate of 58, nonspecific ST abnormality. ? ?Patient takes propanolol for history of essential tremor. ? ?OSA on CPAP. ? ?Preop labs reviewed, WNL. ?)  ? ? ? ? ? ?Anesthesia Quick Evaluation ? ?

## 2021-12-27 NOTE — H&P (Signed)
?PROVIDER: Beverlee Nims, MD ? ?MRN: [id number] ?DOB: [birth year] ?DATE OF ENCOUNTER: 12/19/2021 ?Subjective  ? ?Chief Complaint: New Consultation (Right Ing. Hernia ) ? ? ?History of Present Illness: ?Tim Gordon is a 61 y.o. male who is seen today for his right inguinal hernia. He reports it is now getting larger and he is becoming much more uncomfortable. It has been more difficult to reduce. I have recently operated on his wife and so he is requesting me now for his hernia surgery. He has had no nausea or vomiting.. ? ? ? ?Review of Systems: ?A complete review of systems was obtained from the patient. I have reviewed this information and discussed as appropriate with the patient. See HPI as well for other ROS. ? ?ROS  ? ?Medical History: ?Past Medical History:  ?Diagnosis Date  ? GERD (gastroesophageal reflux disease)  ? Hyperlipidemia  ? Hypertension  ? Liver disease  ? Sleep apnea  ? ?Patient Active Problem List  ?Diagnosis  ? Atrial bigeminy  ? BPH (benign prostatic hyperplasia)  ? Calcific tendinitis  ? Chronic insomnia  ? Diverticulitis  ? Diverticulitis large intestine  ? Diverticulosis  ? Essential hypertension  ? Essential tremor  ? History of migraine headaches  ? Hypercholesteremia  ? Impingement syndrome of left shoulder region  ? Indirect hyperbilirubinemia  ? Nocturia  ? Ocular migraine  ? OSA (obstructive sleep apnea)  ? Prediabetes  ? Right inguinal hernia  ? S/P appendectomy  ? Sigmoid diverticulitis  ? Skin lesion of face  ? ?Past Surgical History:  ?Procedure Laterality Date  ? left knee repair 2023  ? APPENDECTOMY  ? COLON SURGERY  ? ? ?Allergies  ?Allergen Reactions  ? Tamsulosin Unknown  ?Dizziness, felt "funny"  ? Clarithromycin Diarrhea  ? ?Current Outpatient Medications on File Prior to Visit  ?Medication Sig Dispense Refill  ? hydroCHLOROthiazide (HYDRODIURIL) 25 MG tablet hydrochlorothiazide 25 mg tablet  ? losartan (COZAAR) 50 MG tablet Take 50 mg by mouth once daily  ?  pantoprazole (PROTONIX) 20 MG DR tablet pantoprazole 20 mg tablet,delayed release  ? propranoloL (INDERAL) 20 MG tablet propranolol 20 mg tablet  ? rosuvastatin (CRESTOR) 5 MG tablet Take 5 mg by mouth at bedtime  ? ?No current facility-administered medications on file prior to visit.  ? ?Family History  ?Problem Relation Age of Onset  ? Stroke Mother  ? High blood pressure (Hypertension) Mother  ? Hyperlipidemia (Elevated cholesterol) Mother  ? Coronary Artery Disease (Blocked arteries around heart) Mother  ? High blood pressure (Hypertension) Father  ? Hyperlipidemia (Elevated cholesterol) Father  ? ? ?Social History  ? ?Tobacco Use  ?Smoking Status Never  ?Smokeless Tobacco Never  ? ? ?Social History  ? ?Socioeconomic History  ? Marital status: Married  ?Tobacco Use  ? Smoking status: Never  ? Smokeless tobacco: Never  ?Vaping Use  ? Vaping Use: Never used  ?Substance and Sexual Activity  ? Alcohol use: Yes  ?Comment: 1 x month 2 drinks or less  ? Drug use: Never  ? ?Objective:  ? ?Vitals:  ?12/19/21 1455  ?Weight: 88.5 kg (195 lb)  ?Height: 175.3 cm ('5\' 9"'$ )  ? ?Body mass index is 28.8 kg/m?. ? ?Physical Exam  ? ?He is slightly uncomfortable on exam ? ?Wearing a hernia belt ? ?He has a large right inguinal hernia which is somewhat difficult to reduce but reducible. It is tender ? ?Labs, Imaging and Diagnostic Testing: ? ?Assessment and Plan:  ? ?Diagnoses and all orders  for this visit: ? ?Right inguinal hernia ? ? ? ?He is now requesting an attempt at a laparoscopic repair. Given his increase in symptoms, discomfort, and need for a hernia belt I recommend urgent repair. I will attempt this laparoscopically although he has had prior abdominal surgery and I may not be able to create the space. We would then convert to an open procedure. I discussed the surgical risk in detail. We discussed the risks which include but are not limited to bleeding, infection, injury to surrounding structures, use of mesh, hernia  recurrence, nerve entrapment, chronic pain, etc. We also discussed postoperative recovery. He understands and wished to proceed with surgery  ?

## 2021-12-27 NOTE — Progress Notes (Signed)
Anesthesia Chart Review: ? ?I evaluated patient at preadmission testing appointment due to equivocal history of murmur.  He reports that he had a cardiology evaluation in 2019 at the request of his PCP for atrial bigeminy seen on EKG.  He was last seen by Dr. Kenton Kingfisher 06/27/2018 at [facility] and was doing well from a cardiac standpoint, no testing was felt needed for evaluation of atrial bigeminy, patient was noted to be active and asymptomatic.  He was recommended to continue exercise and risk factor reduction and follow-up with cardiology on an as-needed basis. ? ?Patient reports he thinks he may have been told he had a murmur at some point, however he admits he may be confusing this with the diagnosis of atrial bigeminy.  He denies any history of chest pain, shortness of breath, palpitations. His functional status continues to be quite high, although he is currently limited by knee pain.  He previously played racquetball regularly without any cardiopulmonary symptoms.  On exam today, auscultation reveals heart regular rate and rhythm, no murmurs/rubs/gallop.  Lungs CTAB.  Blood pressure reasonably well controlled 141/84.  EKG shows sinus bradycardia at rate of 58, nonspecific ST abnormality. ? ?Patient takes propanolol for history of essential tremor. ? ?OSA on CPAP. ? ?Preop labs reviewed, WNL. ? ? ? ?Karoline Caldwell, PA-C ?[facility]/Anesthesiology ?Phone [phone] ?12/27/2021 9:35 AM ? ?

## 2021-12-28 ENCOUNTER — Ambulatory Visit (HOSPITAL_COMMUNITY): Payer: BC Managed Care – PPO | Admitting: Physician Assistant

## 2021-12-28 ENCOUNTER — Encounter (HOSPITAL_COMMUNITY): Payer: Self-pay | Admitting: Surgery

## 2021-12-28 ENCOUNTER — Encounter (HOSPITAL_COMMUNITY)
Admission: RE | Disposition: A | Discharge status: Home / Self Care | Payer: Self-pay | Source: Ambulatory Visit | Attending: Surgery

## 2021-12-28 ENCOUNTER — Other Ambulatory Visit: Payer: Self-pay

## 2021-12-28 ENCOUNTER — Ambulatory Visit (HOSPITAL_COMMUNITY)
Admission: RE | Admit: 2021-12-28 | Discharge: 2021-12-28 | Disposition: A | Discharge status: Home / Self Care | Payer: BC Managed Care – PPO | Source: Ambulatory Visit | Attending: Surgery | Admitting: Surgery

## 2021-12-28 DIAGNOSIS — K219 Gastro-esophageal reflux disease without esophagitis: Secondary | ICD-10-CM | POA: Diagnosis not present

## 2021-12-28 DIAGNOSIS — I10 Essential (primary) hypertension: Secondary | ICD-10-CM | POA: Insufficient documentation

## 2021-12-28 DIAGNOSIS — K4090 Unilateral inguinal hernia, without obstruction or gangrene, not specified as recurrent: Secondary | ICD-10-CM | POA: Diagnosis not present

## 2021-12-28 HISTORY — PX: INGUINAL HERNIA REPAIR: SHX194

## 2021-12-28 SURGERY — REPAIR, HERNIA, INGUINAL, LAPAROSCOPIC
Anesthesia: General | Site: Inguinal | Laterality: Right

## 2021-12-28 MED ORDER — MIDAZOLAM HCL 2 MG/2ML IJ SOLN
INTRAMUSCULAR | Status: DC | PRN
  Administered 2021-12-28: 2 mg via INTRAVENOUS

## 2021-12-28 MED ORDER — HYDROMORPHONE HCL 1 MG/ML IJ SOLN
0.2500 mg | INTRAMUSCULAR | Status: DC | PRN
  Administered 2021-12-28: 0.25 mg via INTRAVENOUS

## 2021-12-28 MED ORDER — ORAL CARE MOUTH RINSE: 15.0000 mL | Freq: Once | OROMUCOSAL | Status: AC

## 2021-12-28 MED ORDER — OXYCODONE HCL 5 MG PO TABS: 5.0000 mg | ORAL_TABLET | Freq: Four times a day (QID) | ORAL | 0 refills | Status: DC | PRN

## 2021-12-28 MED ORDER — CHLORHEXIDINE GLUCONATE CLOTH 2 % EX PADS
6.0000 | MEDICATED_PAD | Freq: Once | CUTANEOUS | Status: DC
Start: 2021-12-28 — End: 2021-12-28

## 2021-12-28 MED ORDER — BUPIVACAINE HCL (PF) 0.25 % IJ SOLN
INTRAMUSCULAR | Status: AC
  Filled 2021-12-28: qty 30

## 2021-12-28 MED ORDER — CHLORHEXIDINE GLUCONATE 0.12 % MT SOLN
OROMUCOSAL | Status: AC
Start: 2021-12-28 — End: 2021-12-28
  Administered 2021-12-28: 15 mL via OROMUCOSAL
  Filled 2021-12-28: qty 15

## 2021-12-28 MED ORDER — CEFAZOLIN SODIUM-DEXTROSE 2-4 GM/100ML-% IV SOLN
2.0000 g | INTRAVENOUS | Status: AC
  Administered 2021-12-28: 2 g via INTRAVENOUS

## 2021-12-28 MED ORDER — HYDROMORPHONE HCL 1 MG/ML IJ SOLN
INTRAMUSCULAR | Status: AC
  Filled 2021-12-28: qty 1

## 2021-12-28 MED ORDER — OXYCODONE HCL 5 MG/5ML PO SOLN: 5.0000 mg | Freq: Once | ORAL | Status: DC | PRN

## 2021-12-28 MED ORDER — ACETAMINOPHEN 500 MG PO TABS
1000.0000 mg | ORAL_TABLET | Freq: Once | ORAL | Status: AC
  Administered 2021-12-28: 1000 mg via ORAL
  Filled 2021-12-28: qty 2

## 2021-12-28 MED ORDER — MEPERIDINE HCL 25 MG/ML IJ SOLN: 6.2500 mg | INTRAMUSCULAR | Status: DC | PRN

## 2021-12-28 MED ORDER — 0.9 % SODIUM CHLORIDE (POUR BTL) OPTIME
TOPICAL | Status: DC | PRN
  Administered 2021-12-28: 1000 mL

## 2021-12-28 MED ORDER — FENTANYL CITRATE (PF) 250 MCG/5ML IJ SOLN
INTRAMUSCULAR | Status: AC
  Filled 2021-12-28: qty 5

## 2021-12-28 MED ORDER — DEXAMETHASONE SODIUM PHOSPHATE 10 MG/ML IJ SOLN
INTRAMUSCULAR | Status: DC | PRN
  Administered 2021-12-28: 10 mg via INTRAVENOUS

## 2021-12-28 MED ORDER — ONDANSETRON HCL 4 MG/2ML IJ SOLN
INTRAMUSCULAR | Status: DC | PRN
  Administered 2021-12-28: 4 mg via INTRAVENOUS

## 2021-12-28 MED ORDER — CHLORHEXIDINE GLUCONATE 0.12 % MT SOLN: 15.0000 mL | Freq: Once | OROMUCOSAL | Status: AC

## 2021-12-28 MED ORDER — PROPOFOL 10 MG/ML IV BOLUS
INTRAVENOUS | Status: DC | PRN
  Administered 2021-12-28: 20 mg via INTRAVENOUS
  Administered 2021-12-28: 150 mg via INTRAVENOUS

## 2021-12-28 MED ORDER — MIDAZOLAM HCL 2 MG/2ML IJ SOLN
INTRAMUSCULAR | Status: AC
  Filled 2021-12-28: qty 2

## 2021-12-28 MED ORDER — BUPIVACAINE HCL (PF) 0.25 % IJ SOLN
INTRAMUSCULAR | Status: DC | PRN
  Administered 2021-12-28: 20 mL

## 2021-12-28 MED ORDER — OXYCODONE HCL 5 MG PO TABS: 5.0000 mg | ORAL_TABLET | Freq: Once | ORAL | Status: DC | PRN

## 2021-12-28 MED ORDER — AMISULPRIDE (ANTIEMETIC) 5 MG/2ML IV SOLN: 10.0000 mg | Freq: Once | INTRAVENOUS | Status: DC | PRN

## 2021-12-28 MED ORDER — LIDOCAINE 2% (20 MG/ML) 5 ML SYRINGE
INTRAMUSCULAR | Status: DC | PRN
  Administered 2021-12-28: 80 mg via INTRAVENOUS

## 2021-12-28 MED ORDER — CEFAZOLIN SODIUM-DEXTROSE 2-4 GM/100ML-% IV SOLN
INTRAVENOUS | Status: AC
  Filled 2021-12-28: qty 100

## 2021-12-28 MED ORDER — LACTATED RINGERS IV SOLN: INTRAVENOUS | Status: DC

## 2021-12-28 MED ORDER — ROCURONIUM BROMIDE 10 MG/ML (PF) SYRINGE
PREFILLED_SYRINGE | INTRAVENOUS | Status: DC | PRN
  Administered 2021-12-28: 50 mg via INTRAVENOUS

## 2021-12-28 MED ORDER — FENTANYL CITRATE (PF) 250 MCG/5ML IJ SOLN
INTRAMUSCULAR | Status: DC | PRN
  Administered 2021-12-28: 50 ug via INTRAVENOUS
  Administered 2021-12-28: 100 ug via INTRAVENOUS

## 2021-12-28 MED ORDER — SUGAMMADEX SODIUM 200 MG/2ML IV SOLN
INTRAVENOUS | Status: DC | PRN
  Administered 2021-12-28: 177 mg via INTRAVENOUS

## 2021-12-28 MED ORDER — CHLORHEXIDINE GLUCONATE CLOTH 2 % EX PADS: 6.0000 | MEDICATED_PAD | Freq: Once | CUTANEOUS | Status: DC

## 2021-12-28 SURGICAL SUPPLY — 36 items
APPLIER CLIP LOGIC TI 5 (MISCELLANEOUS) IMPLANT
BAG COUNTER SPONGE SURGICOUNT (BAG) ×2 IMPLANT
BLADE CLIPPER SURG (BLADE) IMPLANT
CANISTER SUCT 3000ML PPV (MISCELLANEOUS) ×1 IMPLANT
CHLORAPREP W/TINT 26 (MISCELLANEOUS) ×2 IMPLANT
COVER SURGICAL LIGHT HANDLE (MISCELLANEOUS) ×2 IMPLANT
DERMABOND ADVANCED (GAUZE/BANDAGES/DRESSINGS) ×1
DERMABOND ADVANCED .7 DNX12 (GAUZE/BANDAGES/DRESSINGS) ×1 IMPLANT
DEVICE SECURE STRAP 25 ABSORB (INSTRUMENTS) ×2 IMPLANT
DISSECT BALLN SPACEMKR + OVL (BALLOONS) ×2
DISSECTOR BALLN SPACEMKR + OVL (BALLOONS) ×1 IMPLANT
DISSECTOR BLUNT TIP ENDO 5MM (MISCELLANEOUS) IMPLANT
DRAPE LAPAROSCOPIC ABDOMINAL (DRAPES) ×2 IMPLANT
ELECT REM PT RETURN 9FT ADLT (ELECTROSURGICAL) ×2
ELECTRODE REM PT RTRN 9FT ADLT (ELECTROSURGICAL) ×1 IMPLANT
GLOVE SURG SIGNA 7.5 PF LTX (GLOVE) ×2 IMPLANT
GOWN STRL REUS W/ TWL LRG LVL3 (GOWN DISPOSABLE) ×2 IMPLANT
GOWN STRL REUS W/ TWL XL LVL3 (GOWN DISPOSABLE) ×1 IMPLANT
GOWN STRL REUS W/TWL LRG LVL3 (GOWN DISPOSABLE) ×2
GOWN STRL REUS W/TWL XL LVL3 (GOWN DISPOSABLE) ×1
KIT BASIN OR (CUSTOM PROCEDURE TRAY) ×2 IMPLANT
KIT TURNOVER KIT B (KITS) ×2 IMPLANT
MESH 3DMAX 4X6 RT LRG (Mesh General) ×1 IMPLANT
NS IRRIG 1000ML POUR BTL (IV SOLUTION) ×2 IMPLANT
PAD ARMBOARD 7.5X6 YLW CONV (MISCELLANEOUS) ×2 IMPLANT
SET IRRIG TUBING LAPAROSCOPIC (IRRIGATION / IRRIGATOR) IMPLANT
SET TROCAR LAP APPLE-HUNT 5MM (ENDOMECHANICALS) ×2 IMPLANT
SET TUBE SMOKE EVAC HIGH FLOW (TUBING) ×2 IMPLANT
SUT MNCRL AB 4-0 PS2 18 (SUTURE) ×2 IMPLANT
TOWEL GREEN STERILE (TOWEL DISPOSABLE) ×2 IMPLANT
TOWEL GREEN STERILE FF (TOWEL DISPOSABLE) ×2 IMPLANT
TRAY FOLEY W/BAG SLVR 16FR (SET/KITS/TRAYS/PACK)
TRAY FOLEY W/BAG SLVR 16FR ST (SET/KITS/TRAYS/PACK) IMPLANT
TRAY LAPAROSCOPIC MC (CUSTOM PROCEDURE TRAY) ×2 IMPLANT
WARMER LAPAROSCOPE (MISCELLANEOUS) ×1 IMPLANT
WATER STERILE IRR 1000ML POUR (IV SOLUTION) ×1 IMPLANT

## 2021-12-28 NOTE — Interval H&P Note (Signed)
History and Physical Interval Note: no change in H and P ? ?12/28/2021 ?8:52 AM ? ?Tim Gordon  has presented today for surgery, with the diagnosis of RIGHT INGUINAL HERNIA.  The various methods of treatment have been discussed with the patient and family. After consideration of risks, benefits and other options for treatment, the patient has consented to  Procedure(s): ?LAPAROSCOPIC RIGHT INGUINAL HERNIA REPAIR WITH MESH (Right) as a surgical intervention.  The patient's history has been reviewed, patient examined, no change in status, stable for surgery.  I have reviewed the patient's chart and labs.  Questions were answered to the patient's satisfaction.   ? ? ?Coralie Keens ? ? ?

## 2021-12-28 NOTE — Discharge Instructions (Signed)
[facility] _______[facility] ? ?UMBILICAL OR INGUINAL HERNIA REPAIR: POST OP INSTRUCTIONS ? ?Always review your discharge instruction sheet given to you by the facility where your surgery was performed. ?IF YOU HAVE DISABILITY OR FAMILY LEAVE FORMS, YOU MUST BRING THEM TO THE OFFICE FOR PROCESSING.   ?DO NOT GIVE THEM TO YOUR DOCTOR. ? ?1. A  prescription for pain medication may be given to you upon discharge.  Take your pain medication as prescribed, if needed.  If narcotic pain medicine is not needed, then you may take acetaminophen (Tylenol) or ibuprofen (Advil) as needed. ?2. Take your usually prescribed medications unless otherwise directed. ?If you need a refill on your pain medication, please contact your pharmacy.  They will contact our office to request authorization. Prescriptions will not be filled after 5 pm or on week-ends. ?3. You should follow a light diet the first 24 hours after arrival home, such as soup and crackers, etc.  Be sure to include lots of fluids daily.  Resume your normal diet the day after surgery. ?4.Most patients will experience some swelling and bruising around the umbilicus or in the groin and scrotum.  Ice packs and reclining will help.  Swelling and bruising can take several days to resolve.  ?6. It is common to experience some constipation if taking pain medication after surgery.  Increasing fluid intake and taking a stool softener (such as Colace) will usually help or prevent this problem from occurring.  A mild laxative (Milk of Magnesia or Miralax) should be taken according to package directions if there are no bowel movements after 48 hours. ?7. Unless discharge instructions indicate otherwise, you may remove your bandages 24-48 hours after surgery, and you may shower at that time.  You may have steri-strips (small skin tapes) in place directly over the incision.  These strips should be left on the skin for 7-10 days.  If your surgeon used skin glue on the  incision, you may shower in 24 hours.  The glue will flake off over the next 2-3 weeks.  Any sutures or staples will be removed at the office during your follow-up visit. ?8. ACTIVITIES:  You may resume regular (light) daily activities beginning the next day--such as daily self-care, walking, climbing stairs--gradually increasing activities as tolerated.  You may have sexual intercourse when it is comfortable.  Refrain from any heavy lifting or straining until approved by your doctor. ? ?a.You may drive when you are no longer taking prescription pain medication, you can comfortably wear a seatbelt, and you can safely maneuver your car and apply brakes. ?b.RETURN TO WORK:   ?_____________________________________________ ? ?9.You should see your doctor in the office for a follow-up appointment approximately 2-3 weeks after your surgery.  Make sure that you call for this appointment within a day or two after you arrive home to insure a convenient appointment time. ?10.OTHER INSTRUCTIONS: __OK TO SHOWER STARTING TOMORROW ?ICE PACK, TYLENOL, AND IBUPROFEN ALSO FOR PAIN ?NO LIFTING MORE THAN 15 TO 20 POUNDS FOR 4 WEEKS ?_______________________ ?   _____________________________________ ? ?WHEN TO CALL YOUR DOCTOR: ?Fever over 101.0 ?Inability to urinate ?Nausea and/or vomiting ?Extreme swelling or bruising ?Continued bleeding from incision. ?Increased pain, redness, or drainage from the incision ? ?The clinic staff is available to answer your questions during regular business hours.  Please don?t hesitate to call and ask to speak to one of the nurses for clinical concerns.  If you have a medical emergency, go to the nearest emergency room or  call 911.  A surgeon from [facility] is always on call at the hospital ? ? ?[address], [place], [place], [place]  [address] ? ? [address] [place], [place], [place]   [address] ?[phone] ? [phone] ? FAX [phone] ?Web site:  [email or web address]  ?

## 2021-12-28 NOTE — Anesthesia Procedure Notes (Signed)
Procedure Name: Intubation ?Date/Time: 12/28/2021 9:52 AM ?Performed by: Nolon Nations, MD ?Pre-anesthesia Checklist: Patient identified, Emergency Drugs available, Suction available and Patient being monitored ?Patient Re-evaluated:Patient Re-evaluated prior to induction ?Oxygen Delivery Method: Circle system utilized ?Preoxygenation: Pre-oxygenation with 100% oxygen ?Induction Type: IV induction ?Ventilation: Mask ventilation without difficulty ?Laryngoscope Size: Mac and 3 ?Grade View: Grade I ?Tube type: Oral ?Tube size: 7.5 mm ?Number of attempts: 1 ?Airway Equipment and Method: Stylet and Oral airway ?Placement Confirmation: ETT inserted through vocal cords under direct vision, positive ETCO2 and breath sounds checked- equal and bilateral ?Secured at: 22 cm ?Tube secured with: Tape ?Dental Injury: Teeth and Oropharynx as per pre-operative assessment  ?Comments: Paramedic student attempted intubation. Atlantis Delong assisted with view as student passed ETT through cords. ? ? ? ? ?

## 2021-12-28 NOTE — Transfer of Care (Signed)
Immediate Anesthesia Transfer of Care Note ? ?Patient: Tim Gordon ? ?Procedure(s) Performed: LAPAROSCOPIC RIGHT INGUINAL HERNIA REPAIR WITH MESH (Right: Inguinal) ? ?Patient Location: PACU ? ?Anesthesia Type:General ? ?Level of Consciousness: drowsy ? ?Airway & Oxygen Therapy: Patient Spontanous Breathing and Patient connected to face mask oxygen ? ?Post-op Assessment: Report given to RN and Post -op Vital signs reviewed and stable ? ?Post vital signs: Reviewed and stable ? ?Last Vitals:  ?Vitals Value Taken Time  ?BP 149/80 12/28/21 1033  ?Temp    ?Pulse 59 12/28/21 1034  ?Resp 16 12/28/21 1034  ?SpO2 97 % 12/28/21 1034  ?Vitals shown include unvalidated device data. ? ?Last Pain:  ?Vitals:  ? 12/28/21 0911  ?TempSrc:   ?PainSc: 0-No pain  ?   ? ?Patients Stated Pain Goal: 0 (12/28/21 0911) ? ?Complications: No notable events documented. ?

## 2021-12-28 NOTE — Op Note (Signed)
LAPAROSCOPIC RIGHT INGUINAL HERNIA REPAIR WITH MESH  Procedure Note ? ?Revonda Humphrey ?12/28/2021 ? ? ?Pre-op Diagnosis: RIGHT INGUINAL HERNIA ?    ?Post-op Diagnosis: same ? ?Procedure(s): ?LAPAROSCOPIC RIGHT INGUINAL HERNIA REPAIR WITH MESH ? ?Surgeon(s): ?Coralie Keens, MD ? ?Anesthesia: General ? ?Staff:  ?Circulator: Margy Clarks, RN ?Scrub Person: Serita Grammes; Celene Squibb, RN ? ?Estimated Blood Loss: Minimal ?              ?Findings: The patient was found to have a moderate sized direct and indirect right inguinal hernia which was repaired with a large piece of Prolene 3 DMax mesh from [employer] ? ?Procedure: The patient was brought to operating identifies correct patient.  He was placed upon the operating table general anesthesia was induced.  His abdomen was prepped and draped in usual sterile fashion.  I made a small vertical incision below the umbilicus with a scalpel.  I took this down to the fascia which was opened just to the right of the midline.  The rectus muscle was then identified and elevated.  I passed the dissecting balloon underneath the rectus muscle and manipulated toward the pubis.  The dissecting balloon was then insufflated under direct vision dissecting out the preperitoneal space.  The dissecting balloon was then removed and insufflation was begun with carbon dioxide.  Next two 5 mm trocars were placed in the patient's lower midline both under direct vision.  The right inguinal area was dissected out.  The patient had a moderate direct hernia defect.  I dissected out the cord structures and found a moderate indirect hernia sac which was easily separated from the cord structures bluntly.  I next brought a piece of 3D max large Prolene mesh onto the field.  I right-sided piece of mesh was used.  It was placed through the umbilical trocar and opened as an only on the inguinal floor.  I then tacked to Cooper's ligament, up the medial abdominal wall, and slightly laterally with the  absorbable tacker.  Wide coverage of the direct defect and cord structures appear to be achieved.  Hemostasis appeared to be achieved.  Next all ports were removed under direct vision and the preperitoneal space was seen to collapse appropriately with the mesh staying in place.  I then opened up the peritoneum at the umbilicus with a hemostat under direct vision to relieve any pneumoperitoneum.  The fascia was then closed with a figure-of-eight 0 Vicryl suture.  All incisions were anesthetized Marcaine and closed with 4-0 Monocryl sutures.  I performed an ilioinguinal nerve block on the right side with Marcaine as well.  The patient tolerated procedure well.  Dermabond was placed on the incisions.  He was then extubated in the operating room and taken in stable condition to the recovery room. ? ?Coralie Keens  ? ?Date: 12/28/2021  Time: 10:23 AM ? ? ? ?

## 2021-12-28 NOTE — Anesthesia Postprocedure Evaluation (Signed)
Anesthesia Post Note ? ?Patient: Tim Gordon ? ?Procedure(s) Performed: LAPAROSCOPIC RIGHT INGUINAL HERNIA REPAIR WITH MESH (Right: Inguinal) ? ?  ? ?Patient location during evaluation: PACU ?Anesthesia Type: General ?Level of consciousness: sedated and patient cooperative ?Pain management: pain level controlled ?Vital Signs Assessment: post-procedure vital signs reviewed and stable ?Respiratory status: spontaneous breathing ?Cardiovascular status: stable ?Anesthetic complications: no ? ? ?No notable events documented. ? ?Last Vitals:  ?Vitals:  ? 12/28/21 1120 12/28/21 1135  ?BP: (!) 131/101 138/89  ?Pulse: (!) 58 65  ?Resp: 15 17  ?Temp:  36.5 ?C  ?SpO2: 93% 94%  ?  ?Last Pain:  ?Vitals:  ? 12/28/21 1135  ?TempSrc:   ?PainSc: 3   ? ? ?  ?  ?  ?  ?  ?  ? ?Nolon Nations ? ? ? ? ?

## 2021-12-29 ENCOUNTER — Encounter (HOSPITAL_COMMUNITY): Payer: Self-pay | Admitting: Surgery

## 2022-01-06 DIAGNOSIS — G4733 Obstructive sleep apnea (adult) (pediatric): Secondary | ICD-10-CM | POA: Diagnosis not present

## 2022-01-09 ENCOUNTER — Encounter: Payer: Self-pay | Admitting: Family Medicine

## 2022-01-09 ENCOUNTER — Other Ambulatory Visit: Payer: Self-pay | Admitting: *Deleted

## 2022-01-09 MED ORDER — PANTOPRAZOLE SODIUM 20 MG PO TBEC: 20.0000 mg | DELAYED_RELEASE_TABLET | Freq: Every morning | ORAL | 3 refills | Status: DC

## 2022-02-05 DIAGNOSIS — G4733 Obstructive sleep apnea (adult) (pediatric): Secondary | ICD-10-CM | POA: Diagnosis not present

## 2022-02-15 ENCOUNTER — Encounter: Payer: Self-pay | Admitting: Family Medicine

## 2022-03-06 ENCOUNTER — Ambulatory Visit: Payer: BC Managed Care – PPO | Admitting: Neurology

## 2022-03-06 ENCOUNTER — Encounter: Payer: Self-pay | Admitting: Neurology

## 2022-03-06 VITALS — BP 127/81 | HR 58 | Ht 69.0 in | Wt 201.2 lb

## 2022-03-06 DIAGNOSIS — G250 Essential tremor: Secondary | ICD-10-CM

## 2022-03-06 DIAGNOSIS — G4733 Obstructive sleep apnea (adult) (pediatric): Secondary | ICD-10-CM | POA: Diagnosis not present

## 2022-03-06 DIAGNOSIS — Z9989 Dependence on other enabling machines and devices: Secondary | ICD-10-CM | POA: Diagnosis not present

## 2022-03-06 NOTE — Progress Notes (Signed)
Subjective:    Patient ID: Tim Gordon is a 61 y.o. male.  HPI    Interim history:   Tim Gordon is a 61 year old right-handed gentleman with an underlying medical history of reflux disease, kidney stones, migraine headaches, hyperlipidemia, allergies, essential tremor, diverticulitis, dizziness, sleep apnea and overweight state, who presents for follow-up consultation of his OSA after interim home sleep testing and starting AutoPap therapy.  The patient is unaccompanied today.  I evaluated him on 10/26/2021 for 2 episodes of dizziness in the recent past.  He had also noticed worsening tremor.  He had tried an oral appliance for sleep apnea in the past but could not tolerate it.  We talked about his symptoms at length.  He was advised to continue with propranolol for migraine prevention and symptomatic control of his essential tremor.  He was advised to increase it to 20 mg twice daily.  He was advised to proceed with repeat sleep testing.  He had a home sleep test on 11/27/2021 which indicated severe obstructive sleep apnea by number of events with an AHI of 32.4/h, O2 nadir 86% with mild to moderate intermittent snoring detected.  He was advised to start AutoPap therapy.  His set up date was 12/06/2021.  He has a ResMed air sense 11 AutoSet machine.  Today, 03/06/2022: I reviewed his AutoPap compliance data from 01/30/2022 through 02/28/2022, which is a total of 30 days, during which time he used his machine every night with percent use days greater than 4 hours at 100%, indicating superb compliance with an average usage of 6 hours and 29 minutes, residual AHI at goal at 1.2/h, average pressure for the 95th percentile at 7.1 cm with a range of 5 to 10 cm with EPR.  Leak on the lower side with the 95th percentile at 5 L/min.  He reports doing fairly well, he uses a fullface mask.  He tried an under the nose style fullface mask but did not do well on it.  He has some excess moisture around the mask in the middle  of the night which is bothersome, he has reduced the humidifier setting and the temperature on the air which made a little bit of a difference.  He still wakes up sometimes with a chest soreness or pressure-like sensation, no other associated symptoms such as radiating pain, shortness of breath, palpitations, cold sweats.  In fact, he feels that he is waking up a little bit better rested, less nocturia also noted although he does wake up about twice per average night to go to the bathroom still.  He has not had any further dizziness spells.  Increasing the propranolol to 40 mg once daily has helped his essential tremor which affects mostly his hands but his wife has also noticed recent trembling in his head which he was not aware of.  He had recent left knee surgery, as well as right hernia repair.  He is currently not taking any narcotic pain medication.  The patient's allergies, current medications, family history, past medical history, past social history, past surgical history and problem list were reviewed and updated as appropriate.   Previously:   10/26/21: (He) reports 2 distinct episodes of dizziness in the past 6 months.  He felt lightheaded and felt like he could fall or pass out.  He denies any spinning sensation or nausea.  He did not have a headache at the time but does have a history of migraines.  Years ago he had migraine headaches and  some years ago he started having ocular migraines without major headaches. He has had recurrent headaches but these are different from his migraines.  He wakes up in the middle of the night with a headache in the back of his head.  It feels like a tightness in the posterior head and neck muscles radiating to the shoulder areas.  He does not sleep well and snoring has worsened.  Of note, he tried an oral appliance for sleep apnea through Dr. Ron Parker but he developed TMJ issues and changes in his teeth alignment.  He could not tolerate the oral appliance.  His wife  reports that his snoring and apneic pauses have become worse.     He takes propranolol 20 mg daily for his essential tremor and has been on the same dose for years.  His tremor has become worse over time as well.   His wife reports that he has had more difficulty with concentration and memory.  He is more irritable.   His blood pressure has been more elevated consistently and he was recently started by his primary care on losartan.  Weight has been fairly stable.  He has reduced his caffeine intake.  He tries to hydrate well with water.  Of note, he has not had an eye examination in over 3 years and he does feel that his prescription is changed.   He had a head CT without contrast on 07/14/2019 and I reviewed the results: IMPRESSION: No focal acute intracranial abnormality identified.   He had a recent brain MRI without contrast through [facility] on 10/13/2019 and I was able to review the results in care everywhere: IMPRESSION: Normal MRI of the brain without contrast.    Of note, he was hospitalized with COVID-pneumonia in October 2020.  I reviewed the hospital records from his admission from 07/25/2019 through 07/27/2019 at [facility], [place].  He presented with shortness of breath and cough and exposure to COVID-19.  He had developed a severe headache.  He had initially presented to the emergency room on 07/14/2019 with the symptoms and had a head CT, he was not hypoxic at the time and was placed on outpatient steroid treatment.  He developed worsening cough and chest congestion and was noted to be hypoxic. He was treated for pneumonia with Levaquin, he was outside the window for remdesivir.   I had evaluated him over 4 years ago for sleep apnea concerns.  He had a baseline sleep study on 08/05/2017 which showed rather mild obstructive sleep apnea with an AHI of 6.1/h, O2 nadir was 87%.  He was advised to consider AutoPap therapy.  He indicated preference to go through with  dentistry with an oral appliance.     06/26/17: 61 year old right-handed gentleman with an underlying medical history of allergies, diverticulitis with status post partial colectomy in 2015, ET, hyperlipidemia, reflux disease, mitral valve prolapse, history of kidney stones, history of ocular migraines, and overweight state, who was previously diagnosed several years ago while in [place], with mild to borderline obstructive sleep apnea for which he tried CPAP. He did not think it helped and could not sleep with it. He stopped it after a month. He reports recurrent headaches for the past few months. He has also noticed tooth grinding and has started using a mouth guard which has helped a little bit. He has a history of snoring and has woken up in the middle of the night with headaches. His Epworth sleepiness score is 4 out  of 24, fatigue score is 22 out of 63. He is married and lives with his wife and 5 year old daughter. He has 4 children, 50 yo son, twins (son and daughter) and 64 yo adoptive daughter. He is a nonsmoker and drinks alcohol occasionally, 4-6 times per month, does not use illicit drugs and does not drink caffeine on a day-to-day basis. He had L spine surgery 2002. He does not have residual pain. He still has migraines, mostly visual auras. His triggers are sleep deprivation and dehydration, changing time zones. He travels about once a month for his work. He has a lot of overseas travels. He does not sleep well. He has multiple nighttime awakenings. Nocturia is about 2-3 times per average night. His snoring is reportedly loud at times and disturbing to his wife. He has restless leg symptoms. He also has bilateral shoulder pain, left more than right. He has been on clonazepam at night for sleep for some time, several years. He used to be on 0.25 mg at night, currently 0.5 mg at night. It also seems to help his restless leg symptoms. His oldest son had restless leg symptoms and also took clonazepam for  some time for it.  His Past Medical History Is Significant For: Past Medical History:  Diagnosis Date   Allergy    Blood in stool    Diverticulitis    GERD (gastroesophageal reflux disease)    CONTROLLED WITH DIET - NO MEDS   History of kidney stones    Hyperlipidemia    Hypertension    Migraines    OCULAR MIGRAINES   Sleep apnea    STATES SLEEP STUDY 4 OR 5 YEARS AGO - TOLD BORDERLINE - TRIED CPAP FOR 30 DAYS - STATES IT DID NOT HELP - NO LONGER USES.    His Past Surgical History Is Significant For: Past Surgical History:  Procedure Laterality Date   APPENDECTOMY     COLON SURGERY     INGUINAL HERNIA REPAIR Right 12/28/2021   Procedure: LAPAROSCOPIC RIGHT INGUINAL HERNIA REPAIR WITH MESH;  Surgeon: Coralie Keens, MD;  Location: [facility];  Service: General;  Laterality: Right;   LAPAROSCOPIC PARTIAL COLECTOMY N/A 06/24/2014   Procedure: LAPAROSCOPIC ASSISTED  PARTIAL COLECTOMY;  Surgeon: Jackolyn Confer, MD;  Location: [facility];  Service: General;  Laterality: N/A;   NASAL RECONSTRUCTION WITH SEPTAL REPAIR     SPINE SURGERY  2001   rupture L4-L5   VASECTOMY      His Family History Is Significant For: Family History  Problem Relation Age of Onset   Arthritis Mother    Hyperlipidemia Mother    Stroke Mother    Hypertension Mother    Heart disease Mother        valvular heart disease ? which valve.   Cancer Father 16       prostate   Hypertension Father    Cancer Maternal Grandmother        ovarian   Stroke Paternal Grandfather    Sleep apnea Neg Hx     His Social History Is Significant For: Social History   Socioeconomic History   Marital status: Married    Spouse name: Not on file   Number of children: Not on file   Years of education: Not on file   Highest education level: Not on file  Occupational History   Not on file  Tobacco Use   Smoking status: Never    Passive exposure: Never   Smokeless tobacco: Never  Vaping Use  Vaping Use: Never used   Substance and Sexual Activity   Alcohol use: Yes    Comment: RARE ALCOHOL   Drug use: No   Sexual activity: Yes    Partners: Female  Other Topics Concern   Not on file  Social History Narrative   Caffeine: 1 cup/day   Social Determinants of Health   Financial Resource Strain: Not on file  Food Insecurity: Not on file  Transportation Needs: Not on file  Physical Activity: Not on file  Stress: Not on file  Social Connections: Not on file    His Allergies Are:  Allergies  Allergen Reactions   Flomax [Tamsulosin]     Dizziness, felt "funny"   Biaxin [Clarithromycin] Diarrhea  :   His Current Medications Are:  Outpatient Encounter Medications as of 03/06/2022  Medication Sig   albuterol (VENTOLIN HFA) 108 (90 Base) MCG/ACT inhaler Inhale 1-2 puffs into the lungs every 4 (four) hours as needed for wheezing or shortness of breath. (Patient taking differently: Inhale 1-2 puffs into the lungs as needed for wheezing or shortness of breath.)   hydrochlorothiazide (HYDRODIURIL) 25 MG tablet Take 1 tablet (25 mg total) by mouth every morning.   loratadine (CLARITIN) 10 MG tablet Take 10 mg by mouth at bedtime.   losartan (COZAAR) 50 MG tablet losartan 50 mg tablet  TAKE ONE TABLET BY MOUTH DAILY.   pantoprazole (PROTONIX) 20 MG tablet Take 1 tablet (20 mg total) by mouth every morning.   propranolol (INDERAL) 20 MG tablet Take 1 tablet (20 mg total) by mouth 2 (two) times daily.   rosuvastatin (CRESTOR) 5 MG tablet Take 5 mg by mouth at bedtime.   HYDROcodone bit-homatropine (HYCODAN) 5-1.5 MG/5ML syrup Take 5 mLs by mouth every 8 (eight) hours as needed.   Multiple Vitamin (MULTIVITAMIN WITH MINERALS) TABS tablet Take 1 tablet by mouth every morning. (Patient not taking: Reported on 12/22/2021)   oxyCODONE (OXY IR/ROXICODONE) 5 MG immediate release tablet Take 1 tablet (5 mg total) by mouth every 6 (six) hours as needed for moderate pain or severe pain.   No facility-administered  encounter medications on file as of 03/06/2022.  :  Review of Systems:  Out of a complete 14 point review of systems, all are reviewed and negative with the exception of these symptoms as listed below:  Review of Systems  Neurological:        Pt is here for CPAP follow up   Pt states he had to adjust his humidity on CPAP machine . Pt states he had a lot of moisture around his mouth , pt states he did change mask but didn't work out for him . Pt states he his wearing is original mask      Objective:  Neurological Exam  Physical Exam Physical Examination:   Vitals:   03/06/22 0735  BP: 127/81  Pulse: (!) 58    General Examination: The patient is a very pleasant 61 y.o. male in no acute distress. He appears well-developed and well-nourished and well groomed.   HEENT: Normocephalic, atraumatic, pupils are equal, round and reactive to light.  He has corrective eye glasses. Extraocular tracking is good without limitation to gaze excursion or nystagmus noted. Normal smooth pursuit is noted. Hearing is grossly intact. Face is symmetric with normal facial animation and normal facial sensation. Speech is clear with no dysarthria noted. There is no hypophonia. There is a slight intermittent head tremor.  Neck is supple with full range of passive and active  motion. There are no carotid bruits on auscultation. Oropharynx exam reveals: mild mouth dryness, good dental hygiene and moderate airway crowding. Tongue protrudes centrally and palate elevates symmetrically.    Chest: Clear to auscultation without wheezing, rhonchi or crackles noted.   Heart: S1+S2+0, regular and normal without murmurs, rubs or gallops noted.  Mildly bradycardic.   Abdomen: Soft, non-tender and non-distended.   Extremities: There is no obvious edema.   Skin: Warm and dry without trophic changes noted.   Musculoskeletal: exam reveals no obvious joint deformities.    Neurologically:  Mental status: The patient is awake,  alert and oriented in all 4 spheres. His immediate and remote memory, attention, language skills and fund of knowledge are appropriate. There is no evidence of aphasia, agnosia, apraxia or anomia. Speech is clear with normal prosody and enunciation. Thought process is linear. Mood is normal and affect is normal.  Cranial nerves II - XII are as described above under HEENT exam.  Motor exam: Normal bulk, strength and tone is noted. There is no resting tremor. He has a mild left upper extremity and slight right upper extremity postural tremor, very little action tremor.  No intention tremor noted.  Fine motor skills and coordination: Grossly intact.  Cerebellar testing: No dysmetria or intention tremor on finger to nose testing. There is no truncal or gait ataxia.  Sensory exam: intact to light touch.  Gait, station and balance: He stands easily. No veering to one side is noted. No leaning to one side is noted. Posture is age-appropriate and stance is narrow based. Gait shows normal stride length and normal pace. No problems turning are noted.    Assessment and Plan:    In summary, Tim Gordon is a very pleasant 61 year old right-handed gentleman with an underlying medical history of reflux disease, kidney stones, migraine headaches, hyperlipidemia, allergies, essential tremor, diverticulitis, mild sleep apnea and overweight state, who presents for follow-up consultation of his obstructive sleep apnea.  Home sleep testing in February 2023 indicated severe obstructive sleep apnea with an AHI of 32.4/h, O2 nadir 86%.  He has established treatment with AutoPap therapy on 12/06/2021 and is compliant with treatment.  He has benefited from treatment, snoring is better, sleep is slightly more consolidated and nocturia is also a little better.  He is commended for his treatment adherence and encouraged to continue with it.  Settings are good, apnea control is very good, leak is on the low side with a fullface mask.   He may want to try a foam rimmed fullface mask called AirTouch in the future.  He is working on weight loss.  He has been on propranolol 40 mg once daily for his essential tremor, we increase this in January 2023.  He has been on a low-dose for years for essential tremor.  He notices benefit from propranolol and is advised to continue with it.  We can renew the prescription when it comes up or his primary care can maintain him if feasible.  He has not episodes of dizziness thankfully.  Work-up has been benign in the past.  He is advised to follow-up at this juncture to see our nurse practitioners primarily for sleep apnea follow-up in 1 year.  I answered all his questions today and he was in agreement.  I spent 30 minutes in total face-to-face time and in reviewing records during pre-charting, more than 50% of which was spent in counseling and coordination of care, reviewing test results, reviewing medications and treatment regimen and/or  in discussing or reviewing the diagnosis of OSA, ET, the prognosis and treatment options. Pertinent laboratory and imaging test results that were available during this visit with the patient were reviewed by me and considered in my medical decision making (see chart for details).

## 2022-03-06 NOTE — Patient Instructions (Addendum)
It was nice to see you again today.  I am glad to hear that you have not had any further dizziness spells.    Please continue using your autoPAP regularly. While your insurance requires that you use PAP at least 4 hours each night on 70% of the nights, I recommend, that you not skip any nights and use it throughout the night if you can. Getting used to PAP and staying with the treatment long term does take time and patience and discipline. Untreated obstructive sleep apnea when it is moderate to severe can have an adverse impact on cardiovascular health and raise her risk for heart disease, arrhythmias, hypertension, congestive heart failure, stroke and diabetes. Untreated obstructive sleep apnea causes sleep disruption, nonrestorative sleep, and sleep deprivation. This can have an impact on your day to day functioning and cause daytime sleepiness and impairment of cognitive function, memory loss, mood disturbance, and problems focussing. Using PAP regularly can improve these symptoms.  For your tremor, we can maintain you on propranolol 40 mg each morning.  I am happy to renew the prescription as it comes up for renewal.  I would not favor increasing it at this point due to your average heart rate in the 50s.  I am glad to hear that you have adjusted to AutoPap therapy and that you have noticed some benefit already.  You may want to try without the humidifier if you wake up with excess moisture around the mask.  Keep up the good work! We can see you in 1 year, you can see one of our nurse practitioners.  If you do weight in the 10 to 15 pound arena, we can recheck your sleep apnea with a home sleep test.

## 2022-03-08 DIAGNOSIS — G4733 Obstructive sleep apnea (adult) (pediatric): Secondary | ICD-10-CM | POA: Diagnosis not present

## 2022-03-19 DIAGNOSIS — D229 Melanocytic nevi, unspecified: Secondary | ICD-10-CM | POA: Diagnosis not present

## 2022-03-19 DIAGNOSIS — L814 Other melanin hyperpigmentation: Secondary | ICD-10-CM | POA: Diagnosis not present

## 2022-03-19 DIAGNOSIS — L570 Actinic keratosis: Secondary | ICD-10-CM | POA: Diagnosis not present

## 2022-03-19 DIAGNOSIS — L821 Other seborrheic keratosis: Secondary | ICD-10-CM | POA: Diagnosis not present

## 2022-03-23 ENCOUNTER — Telehealth: Payer: Self-pay | Admitting: Neurology

## 2022-03-23 DIAGNOSIS — R519 Headache, unspecified: Secondary | ICD-10-CM | POA: Diagnosis not present

## 2022-03-23 DIAGNOSIS — R29818 Other symptoms and signs involving the nervous system: Secondary | ICD-10-CM | POA: Diagnosis not present

## 2022-03-23 DIAGNOSIS — I10 Essential (primary) hypertension: Secondary | ICD-10-CM | POA: Diagnosis not present

## 2022-03-23 DIAGNOSIS — R42 Dizziness and giddiness: Secondary | ICD-10-CM | POA: Diagnosis not present

## 2022-03-23 DIAGNOSIS — Z882 Allergy status to sulfonamides status: Secondary | ICD-10-CM | POA: Diagnosis not present

## 2022-03-23 DIAGNOSIS — R001 Bradycardia, unspecified: Secondary | ICD-10-CM | POA: Diagnosis not present

## 2022-03-23 DIAGNOSIS — H538 Other visual disturbances: Secondary | ICD-10-CM | POA: Diagnosis not present

## 2022-03-23 DIAGNOSIS — H539 Unspecified visual disturbance: Secondary | ICD-10-CM | POA: Diagnosis not present

## 2022-03-23 DIAGNOSIS — H5340 Unspecified visual field defects: Secondary | ICD-10-CM | POA: Diagnosis not present

## 2022-03-23 DIAGNOSIS — E785 Hyperlipidemia, unspecified: Secondary | ICD-10-CM | POA: Diagnosis not present

## 2022-03-23 NOTE — Telephone Encounter (Signed)
Patient called stating he had an episode of blurry vision and his "eyes stopped working together". His left eye felt "droopy". He states this lasted 20 min. Immediately after he had a visual migraine which he has had in the past, that lasted about 15-20 minutes. He feels a little tired but otherwise completely normal. No chest pain, numbness, headaches, SOB, head pain, nausea/vomiting. Per Luvenia Starch does need to see urgent care to R/O TIA. Patient made aware and expressed understanding. Dr. Zigmund Daniel: Juluis Rainier.

## 2022-03-26 ENCOUNTER — Telehealth: Payer: Self-pay | Admitting: General Practice

## 2022-03-26 NOTE — Telephone Encounter (Signed)
Transition Care Management Follow-up Telephone Call Date of discharge and from where: 03/23/22 from [facility] How have you been since you were released from the hospital? Doing better. Reports no more blurred vision.  Any questions or concerns? No  Items Reviewed: Did the pt receive and understand the discharge instructions provided? Yes  Medications obtained and verified? Yes  Other? No  Any new allergies since your discharge? No  Dietary orders reviewed? No Do you have support at home? Yes   Home Care and Equipment/Supplies: Were home health services ordered? no  Functional Questionnaire: (I = Independent and D = Dependent) ADLs: I  Bathing/Dressing- I  Meal Prep- I  Eating- I  Maintaining continence- I  Transferring/Ambulation- I  Managing Meds- I  Follow up appointments reviewed:  PCP Hospital f/u appt confirmed? No  Patient stated that he will follow up with his next appointment. [facility] f/u appt confirmed? Yes  Scheduled to see the neurologist on 03/27/22 @ 0945. Are transportation arrangements needed? No  If their condition worsens, is the pt aware to call PCP or go to the Emergency Dept.? Yes Was the patient provided with contact information for the PCP's office or ED? Yes Was to pt encouraged to call back with questions or concerns? Yes

## 2022-03-27 ENCOUNTER — Encounter: Payer: Self-pay | Admitting: Neurology

## 2022-03-27 ENCOUNTER — Ambulatory Visit: Payer: BC Managed Care – PPO | Admitting: Neurology

## 2022-03-27 VITALS — BP 136/84 | HR 56 | Ht 69.0 in | Wt 198.2 lb

## 2022-03-27 DIAGNOSIS — H539 Unspecified visual disturbance: Secondary | ICD-10-CM | POA: Diagnosis not present

## 2022-03-27 DIAGNOSIS — G4733 Obstructive sleep apnea (adult) (pediatric): Secondary | ICD-10-CM | POA: Diagnosis not present

## 2022-03-27 DIAGNOSIS — G250 Essential tremor: Secondary | ICD-10-CM

## 2022-03-27 DIAGNOSIS — G43109 Migraine with aura, not intractable, without status migrainosus: Secondary | ICD-10-CM

## 2022-03-27 DIAGNOSIS — Z9989 Dependence on other enabling machines and devices: Secondary | ICD-10-CM

## 2022-03-27 MED ORDER — RIZATRIPTAN BENZOATE 5 MG PO TBDP: 5.0000 mg | ORAL_TABLET | ORAL | 0 refills | Status: DC | PRN

## 2022-03-27 MED ORDER — PROPRANOLOL HCL ER 60 MG PO CP24: 60.0000 mg | ORAL_CAPSULE | Freq: Every day | ORAL | 5 refills | Status: DC

## 2022-03-27 NOTE — Patient Instructions (Signed)
It was nice seeing you again today.  I do believe you had a recent ocular migraine events.  Thankfully, work-up was unremarkable but I would recommend you get your electrolytes/chemistry panel rechecked with your primary care at the next appointment, add electrolyte water at least 1 bottle per day, you can also drink some tomato juice, low sodium or eat a banana daily.  As discussed, we will increase your beta-blocker to 60 mg once daily long-acting (propranolol LA). Common side effect reported are: lethargy, sedation, low blood pressure and low pulse rate. Please monitor your BP and Pulse every few days, if your pulse drops lower than 55 you may feel bad and we may have to adjust your dose. Same with your BP below 110/55.   For as needed use for migraine, use Maxalt orally disintegrating tab, 5 mg: take 1 pill early on when you suspect a migraine attack come on. You may take another pill within 2 hours, no more than 2 pills in 24 hours. Most people who take triptans do not have any serious side-effects. However, they can cause drowsiness (remember to not drive or use heavy machinery when drowsy), nausea, dizziness, dry mouth. Less common side effects include strange sensations, such as tightness in your chest or throat, tingling, flushing, and feelings of heaviness or pressure in areas such as the face, limbs, and chest. These in the chest can mimic heart related pain (angina) and may cause alarm, but usually these sensations are not harmful or a sign of a heart attack. However, if you develop intense chest pain or sensations of discomfort, you should stop taking your medication and consult with me or your PCP or go to the nearest urgent care facility or ER or call 911.

## 2022-03-27 NOTE — Progress Notes (Signed)
Subjective:    Patient ID: Tim Gordon is a 61 y.o. male.  HPI    Interim history:   Tim Gordon is a 61 year old right-handed gentleman with an underlying medical history of reflux disease, kidney stones, migraine headaches, hyperlipidemia, allergies, essential tremor, diverticulitis, dizziness, sleep apnea and overweight state, who presents for a sooner than scheduled visit after a recent ER visit for visual disturbance.  The patient is unaccompanied today.  I last saw him on 03/06/2022 for sleep apnea follow-up, at which time he was compliant with his AutoPap.  He had some adjustment issues with the interface.  He reported intermittent chest soreness.  He reported feeling better rested and nocturia had improved.  He felt that propranolol 40 mg once daily it was helpful for his essential tremor.  Of note, he presented to the emergency room through [facility] in [facility] on 03/23/2022 with blurry vision and dizziness.  I reviewed the emergency room records.  Visual symptoms lasted about 15 to 20 minutes.  He also described a visual scotoma.  He had a head CT without contrast on 03/23/2022 and I reviewed the results: Impression: No acute intracranial abnormality.  He had a CT angiogram head and neck on 03/23/2022 and I reviewed the results: Impression: CTA HEAD NECK  1.  No large vessel occlusion identified.  2.  No aneurysm or dissection identified.   Degree of stenosis is determined using NASCET measurement technique:  Severe: 70-99%  Moderate: 50-69%.  Mild: Less than 50%.  CMP was benign with the exception of slightly low potassium at 3.5, albumin slightly elevated at 5.3.  CBC with differential showed slightly low WBC at 3.8, otherwise unremarkable.  He had a portable chest x-ray on 03/23/2022 which showed no acute cardiopulmonary disease.  EKG showed sinus bradycardia.    Today, 03/27/2022: He reports feeling at baseline.  Visual disturbance started fairly suddenly with blurry vision  bilaterally around 9 AM while at work, he had distortion in his vision particularly with the left eye.  He felt that his left eyelid was droopy.  He had no facial weakness, no slurring of speech, no difficulty comprehending, no difficulty speaking, no one-sided weakness or numbness.  He was in a meeting which he was able to complete.  He tried to rest a little bit, went to the bathroom, no lightheadedness, no nausea, no vertigo.  No difficulty walking but because of his blurry vision he uses the handrail when he was going downstairs.  Visual symptoms resolved about 20 minutes into start but he did have a visual scotoma like his usual ocular migraine.  He initially had some head pressure in the center of his forehead but no migraine-like headache afterwards.  He takes his propranolol 40 mg once daily in the morning, typically early morning before going to work.  He tries to hydrate well.  Bright light does trigger his ocular migraines he has found.  He continues to be compliant with his AutoPap and continues to benefit from it.  Essential tremor is under better control with the slight increase in propranolol to 40 mg once daily from 20 mg once daily previously.     The patient's allergies, current medications, family history, past medical history, past social history, past surgical history and problem list were reviewed and updated as appropriate.    Previously:     I evaluated him on 10/26/2021 for 2 episodes of dizziness in the recent past.  He had also noticed worsening tremor.  He  had tried an oral appliance for sleep apnea in the past but could not tolerate it.  We talked about his symptoms at length.  He was advised to continue with propranolol for migraine prevention and symptomatic control of his essential tremor.  He was advised to increase it to 20 mg twice daily.  He was advised to proceed with repeat sleep testing.  He had a home sleep test on 11/27/2021 which indicated severe obstructive sleep apnea  by number of events with an AHI of 32.4/h, O2 nadir 86% with mild to moderate intermittent snoring detected.  He was advised to start AutoPap therapy.  His set up date was 12/06/2021.  He has a ResMed air sense 11 AutoSet machine.   I reviewed his AutoPap compliance data from 01/30/2022 through 02/28/2022, which is a total of 30 days, during which time he used his machine every night with percent use days greater than 4 hours at 100%, indicating superb compliance with an average usage of 6 hours and 29 minutes, residual AHI at goal at 1.2/h, average pressure for the 95th percentile at 7.1 cm with a range of 5 to 10 cm with EPR.  Leak on the lower side with the 95th percentile at 5 L/min.   10/26/21: (He) reports 2 distinct episodes of dizziness in the past 6 months.  He felt lightheaded and felt like he could fall or pass out.  He denies any spinning sensation or nausea.  He did not have a headache at the time but does have a history of migraines.  Years ago he had migraine headaches and some years ago he started having ocular migraines without major headaches. He has had recurrent headaches but these are different from his migraines.  He wakes up in the middle of the night with a headache in the back of his head.  It feels like a tightness in the posterior head and neck muscles radiating to the shoulder areas.  He does not sleep well and snoring has worsened.  Of note, he tried an oral appliance for sleep apnea through Dr. Ron Parker but he developed TMJ issues and changes in his teeth alignment.  He could not tolerate the oral appliance.  His wife reports that his snoring and apneic pauses have become worse.     He takes propranolol 20 mg daily for his essential tremor and has been on the same dose for years.  His tremor has become worse over time as well.   His wife reports that he has had more difficulty with concentration and memory.  He is more irritable.   His blood pressure has been more elevated consistently  and he was recently started by his primary care on losartan.  Weight has been fairly stable.  He has reduced his caffeine intake.  He tries to hydrate well with water.  Of note, he has not had an eye examination in over 3 years and he does feel that his prescription is changed.   He had a head CT without contrast on 07/14/2019 and I reviewed the results: IMPRESSION: No focal acute intracranial abnormality identified.   He had a recent brain MRI without contrast through [facility] on 10/13/2019 and I was able to review the results in care everywhere: IMPRESSION: Normal MRI of the brain without contrast.    Of note, he was hospitalized with COVID-pneumonia in October 2020.  I reviewed the hospital records from his admission from 07/25/2019 through 07/27/2019 at [facility] [place].  He  presented with shortness of breath and cough and exposure to COVID-19.  He had developed a severe headache.  He had initially presented to the emergency room on 07/14/2019 with the symptoms and had a head CT, he was not hypoxic at the time and was placed on outpatient steroid treatment.  He developed worsening cough and chest congestion and was noted to be hypoxic. He was treated for pneumonia with Levaquin, he was outside the window for remdesivir.   I had evaluated him over 4 years ago for sleep apnea concerns.  He had a baseline sleep study on 08/05/2017 which showed rather mild obstructive sleep apnea with an AHI of 6.1/h, O2 nadir was 87%.  He was advised to consider AutoPap therapy.  He indicated preference to go through with dentistry with an oral appliance.     06/26/17: 61 year old right-handed gentleman with an underlying medical history of allergies, diverticulitis with status post partial colectomy in 2015, ET, hyperlipidemia, reflux disease, mitral valve prolapse, history of kidney stones, history of ocular migraines, and overweight state, who was previously diagnosed several years ago  while in [place], with mild to borderline obstructive sleep apnea for which he tried CPAP. He did not think it helped and could not sleep with it. He stopped it after a month. He reports recurrent headaches for the past few months. He has also noticed tooth grinding and has started using a mouth guard which has helped a little bit. He has a history of snoring and has woken up in the middle of the night with headaches. His Epworth sleepiness score is 4 out of 24, fatigue score is 22 out of 63. He is married and lives with his wife and 11 year old daughter. He has 4 children, 61 yo son, twins (son and daughter) and 47 yo adoptive daughter. He is a nonsmoker and drinks alcohol occasionally, 4-6 times per month, does not use illicit drugs and does not drink caffeine on a day-to-day basis. He had L spine surgery 2002. He does not have residual pain. He still has migraines, mostly visual auras. His triggers are sleep deprivation and dehydration, changing time zones. He travels about once a month for his work. He has a lot of overseas travels. He does not sleep well. He has multiple nighttime awakenings. Nocturia is about 2-3 times per average night. His snoring is reportedly loud at times and disturbing to his wife. He has restless leg symptoms. He also has bilateral shoulder pain, left more than right. He has been on clonazepam at night for sleep for some time, several years. He used to be on 0.25 mg at night, currently 0.5 mg at night. It also seems to help his restless leg symptoms. His oldest son had restless leg symptoms and also took clonazepam for some time for it.   His Past Medical History Is Significant For: Past Medical History:  Diagnosis Date   Allergy    Blood in stool    Diverticulitis    GERD (gastroesophageal reflux disease)    CONTROLLED WITH DIET - NO MEDS   History of kidney stones    Hyperlipidemia    Hypertension    Migraines    OCULAR MIGRAINES   Sleep apnea    STATES SLEEP STUDY 4 OR  5 YEARS AGO - TOLD BORDERLINE - TRIED CPAP FOR 30 DAYS - STATES IT DID NOT HELP - NO LONGER USES.    His Past Surgical History Is Significant For: Past Surgical History:  Procedure Laterality Date  APPENDECTOMY     COLON SURGERY     INGUINAL HERNIA REPAIR Right 12/28/2021   Procedure: LAPAROSCOPIC RIGHT INGUINAL HERNIA REPAIR WITH MESH;  Surgeon: Coralie Keens, MD;  Location: [facility];  Service: General;  Laterality: Right;   LAPAROSCOPIC PARTIAL COLECTOMY N/A 06/24/2014   Procedure: LAPAROSCOPIC ASSISTED  PARTIAL COLECTOMY;  Surgeon: Jackolyn Confer, MD;  Location: [facility];  Service: General;  Laterality: N/A;   NASAL RECONSTRUCTION WITH SEPTAL REPAIR     SPINE SURGERY  2001   rupture L4-L5   VASECTOMY      His Family History Is Significant For: Family History  Problem Relation Age of Onset   Arthritis Mother    Hyperlipidemia Mother    Stroke Mother    Hypertension Mother    Heart disease Mother        valvular heart disease ? which valve.   Cancer Father 24       prostate   Hypertension Father    Cancer Maternal Grandmother        ovarian   Stroke Paternal Grandfather    Sleep apnea Neg Hx     His Social History Is Significant For: Social History   Socioeconomic History   Marital status: Married    Spouse name: Not on file   Number of children: Not on file   Years of education: Not on file   Highest education level: Not on file  Occupational History   Not on file  Tobacco Use   Smoking status: Never    Passive exposure: Never   Smokeless tobacco: Never  Vaping Use   Vaping Use: Never used  Substance and Sexual Activity   Alcohol use: Yes    Comment: RARE ALCOHOL   Drug use: No   Sexual activity: Yes    Partners: Female  Other Topics Concern   Not on file  Social History Narrative   Caffeine: 1 cup/day   Social Determinants of Health   Financial Resource Strain: Not on file  Food Insecurity: Not on file  Transportation Needs: Not on file  Physical  Activity: Not on file  Stress: Not on file  Social Connections: Not on file    His Allergies Are:  Allergies  Allergen Reactions   Flomax [Tamsulosin]     Dizziness, felt "funny"   Biaxin [Clarithromycin] Diarrhea  :   His Current Medications Are:  Outpatient Encounter Medications as of 03/27/2022  Medication Sig   hydrochlorothiazide (HYDRODIURIL) 25 MG tablet Take 1 tablet (25 mg total) by mouth every morning.   loratadine (CLARITIN) 10 MG tablet Take 10 mg by mouth at bedtime.   losartan (COZAAR) 50 MG tablet losartan 50 mg tablet  TAKE ONE TABLET BY MOUTH DAILY.   pantoprazole (PROTONIX) 20 MG tablet Take 1 tablet (20 mg total) by mouth every morning.   propranolol (INDERAL) 20 MG tablet Take 1 tablet (20 mg total) by mouth 2 (two) times daily.   rosuvastatin (CRESTOR) 5 MG tablet Take 5 mg by mouth at bedtime.   albuterol (VENTOLIN HFA) 108 (90 Base) MCG/ACT inhaler Inhale 1-2 puffs into the lungs every 4 (four) hours as needed for wheezing or shortness of breath. (Patient not taking: Reported on 03/27/2022)   HYDROcodone bit-homatropine (HYCODAN) 5-1.5 MG/5ML syrup Take 5 mLs by mouth every 8 (eight) hours as needed. (Patient not taking: Reported on 03/27/2022)   Multiple Vitamin (MULTIVITAMIN WITH MINERALS) TABS tablet Take 1 tablet by mouth every morning. (Patient not taking: Reported on 03/27/2022)  oxyCODONE (OXY IR/ROXICODONE) 5 MG immediate release tablet Take 1 tablet (5 mg total) by mouth every 6 (six) hours as needed for moderate pain or severe pain. (Patient not taking: Reported on 03/27/2022)   No facility-administered encounter medications on file as of 03/27/2022.  :  Review of Systems:  Out of a complete 14 point review of systems, all are reviewed and negative with the exception of these symptoms as listed below:  Review of Systems  Neurological:        Follow up ocular migraines.  Seen in ED 03-23-2022 for visual changes. No pain.  Hard to focus.     Objective:   Neurological Exam  Physical Exam Physical Examination:   Vitals:   03/27/22 0947  BP: 136/84  Pulse: (!) 56    General Examination: The patient is a very pleasant 61 y.o. male in no acute distress. He appears well-developed and well-nourished and well groomed.   HEENT: Normocephalic, atraumatic, pupils are equal, round and reactive to light.  He has corrective eye glasses in place, no visual disturbance. Extraocular tracking is good without limitation to gaze excursion or nystagmus noted. Normal smooth pursuit is noted. Hearing is grossly intact. Face is symmetric with normal facial animation and normal facial sensation. Speech is clear with no dysarthria noted. There is no hypophonia. There is a slight intermittent head tremor stable.  Neck is supple with full range of passive and active motion. There are no carotid bruits on auscultation. Oropharynx exam reveals: Stable findings.     Chest: Clear to auscultation without wheezing, rhonchi or crackles noted.   Heart: S1+S2+0, regular and normal without murmurs, rubs or gallops noted.  Mildly bradycardic.   Abdomen: Soft, non-tender and non-distended.   Extremities: There is no obvious edema.   Skin: Warm and dry without trophic changes noted.   Musculoskeletal: exam reveals no obvious joint deformities.    Neurologically:  Mental status: The patient is awake, alert and oriented in all 4 spheres. His immediate and remote memory, attention, language skills and fund of knowledge are appropriate. There is no evidence of aphasia, agnosia, apraxia or anomia. Speech is clear with normal prosody and enunciation. Thought process is linear. Mood is normal and affect is normal.  Cranial nerves II - XII are as described above under HEENT exam.  Motor exam: Normal bulk, strength and tone is noted. There is no resting tremor. He has a mild left upper extremity and slight right upper extremity postural tremor, very little action tremor, stable  findings.  No intention tremor noted.  Fine motor skills and coordination: Grossly intact.  In particular, finger taps and hand movements and rapid alternating patting as well as foot taps unremarkable bilaterally. Cerebellar testing: No dysmetria or intention tremor on finger to nose testing, slight action tremor bilaterally, heel-to-shin unremarkable. There is no truncal or gait ataxia.  Sensory exam: intact to light touch.  Gait, station and balance: He stands easily. No veering to one side is noted. No leaning to one side is noted. Posture is age-appropriate and stance is narrow based. Gait shows normal stride length and normal pace. No problems turning are noted.  Normal tandem walk.   Assessment and Plan:    In summary, ZEBEDIAH BEEZLEY is a very pleasant 61 year old right-handed gentleman with an underlying medical history of reflux disease, kidney stones, migraine headaches, hyperlipidemia, allergies, essential tremor, diverticulitis, mild sleep apnea and overweight state, who presents for evaluation of a recent episode of visual distortion and blurry  vision.  Findings and history are still in keeping with an ocular migraine event.  We talked about migraine triggers.  He had recent work-up through the emergency room in [facility].  He was found to have a slightly low potassium and we talked about electrolyte disturbance.  He is encouraged to try to eat a banana every day but since he does not like to eat bananas, he can use some electrolyte water.  He is advised to have his electrolytes rechecked by his primary care, he has an appointment coming up next month.  He is advised to be mindful about common migraine triggers.  He is encouraged to continue to be fully compliant with his AutoPap. Home sleep testing in February 2023 indicated severe obstructive sleep apnea with an AHI of 32.4/h, O2 nadir 86%.  He established treatment with AutoPap therapy on 12/06/2021 and is compliant with treatment.  He has  benefited from treatment.  We talked about migraine prevention and abortive treatment.  Even though he does not have any significant headaches, he is advised to have Maxalt on hand to 5 mg strength as needed and take it the moment he has blurry vision.  He is advised to increase his propranolol to 60 mg once daily long-acting.  He is advised to monitor his heart rate, if it is consistently below 50, we may have to go back to 40 mg daily.  He has been on propranolol 40 mg once daily for his essential tremor, we increased this in January 2023.  Previously, he was on a low-dose for years for essential tremor.  He is advised to keep his scheduled appointment.  I answered all his questions today and he was in agreement. I spent 40 minutes in total face-to-face time and in reviewing records during pre-charting, more than 50% of which was spent in counseling and coordination of care, reviewing test results, reviewing medications and treatment regimen and/or in discussing or reviewing the diagnosis of ocular migraine, visual disturbance, the prognosis and treatment options. Pertinent laboratory and imaging test results that were available during this visit with the patient were reviewed by me and considered in my medical decision making (see chart for details).

## 2022-03-30 ENCOUNTER — Telehealth: Payer: Self-pay | Admitting: *Deleted

## 2022-04-07 DIAGNOSIS — G4733 Obstructive sleep apnea (adult) (pediatric): Secondary | ICD-10-CM | POA: Diagnosis not present

## 2022-04-11 ENCOUNTER — Telehealth: Payer: Self-pay | Admitting: *Deleted

## 2022-04-11 NOTE — Telephone Encounter (Signed)
Received call from [facility] regarding prescription for Rizatriptan. Concurrent use of Propranolol and Rizatriptan may result in increased rizatriptan exposure. Will have Dr Rexene Alberts address upon return to office tomorrow am.

## 2022-04-12 NOTE — Telephone Encounter (Signed)
[facility] back and spoke with pharmacist. Hanley Seamen verbal order to per Dr Rexene Alberts that they may fill Rizatriptan going forward as pt takes only PRN. He verbalized appreciation and said he would make a note.

## 2022-04-12 NOTE — Telephone Encounter (Signed)
He would take the rizatriptan only sporadically/prn, okay to fill.

## 2022-05-07 ENCOUNTER — Telehealth: Payer: Self-pay | Admitting: Neurology

## 2022-05-07 ENCOUNTER — Encounter: Payer: Self-pay | Admitting: Family Medicine

## 2022-05-07 ENCOUNTER — Ambulatory Visit: Payer: BC Managed Care – PPO | Admitting: Family Medicine

## 2022-05-07 ENCOUNTER — Telehealth: Payer: Self-pay | Admitting: *Deleted

## 2022-05-07 VITALS — BP 122/77 | HR 52 | Ht 69.0 in | Wt 197.0 lb

## 2022-05-07 DIAGNOSIS — H5712 Ocular pain, left eye: Secondary | ICD-10-CM | POA: Diagnosis not present

## 2022-05-07 DIAGNOSIS — H4602 Optic papillitis, left eye: Secondary | ICD-10-CM

## 2022-05-07 DIAGNOSIS — M542 Cervicalgia: Secondary | ICD-10-CM | POA: Diagnosis not present

## 2022-05-07 DIAGNOSIS — H53452 Other localized visual field defect, left eye: Secondary | ICD-10-CM | POA: Diagnosis not present

## 2022-05-07 DIAGNOSIS — H47032 Optic nerve hypoplasia, left eye: Secondary | ICD-10-CM

## 2022-05-07 DIAGNOSIS — I10 Essential (primary) hypertension: Secondary | ICD-10-CM

## 2022-05-07 DIAGNOSIS — Z23 Encounter for immunization: Secondary | ICD-10-CM | POA: Diagnosis not present

## 2022-05-07 DIAGNOSIS — G43109 Migraine with aura, not intractable, without status migrainosus: Secondary | ICD-10-CM | POA: Diagnosis not present

## 2022-05-07 DIAGNOSIS — E782 Mixed hyperlipidemia: Secondary | ICD-10-CM | POA: Diagnosis not present

## 2022-05-07 NOTE — Telephone Encounter (Signed)
Orders signed for MRI br/orb w/wo contrast.

## 2022-05-07 NOTE — Assessment & Plan Note (Signed)
BP is well controlled today.  Recommend continuation of current medications.

## 2022-05-07 NOTE — Assessment & Plan Note (Signed)
Tolerating crestor at current strength.  Recommend continuation.  Updating lipid panel.

## 2022-05-07 NOTE — Telephone Encounter (Signed)
120 mins MRI brain w/wo & MRI orbits w/wo Dr. Reece Leader Josem Kaufmann: [id number] exp. 05/07/22-07/05/22 Scheduled at [facility] 05/09/22 at 3:30pm

## 2022-05-07 NOTE — Telephone Encounter (Signed)
I have been informed the pt is scheduled for his MRIs on 05/09/22 here at [facility].

## 2022-05-07 NOTE — Assessment & Plan Note (Signed)
Given handout on home stretches.  He will see massage therapy as well. He will let me know if not improving.

## 2022-05-07 NOTE — Assessment & Plan Note (Signed)
Followed by neurology.  Maxalt if helpful, he will continue PRN.

## 2022-05-07 NOTE — Progress Notes (Signed)
Tim Gordon - 61 y.o. male MRN [id number]  Date of birth: May 25, 1961  Subjective Chief Complaint  Patient presents with   Eye Pain   Hand Pain   Neck Pain    HPI Tim Gordon is a 61 y.o. male here today for follow up visit.   Seen last month for vision changes.  Dx with ocular migraines.  Seen by neurology and maxalt added.  Having a little eye pressure today.  Denies pain or vision changes.   Having some mild neck pain and stiffness. Denies radicular pain.   Continues to do well with HCTZ, losartan and propranolol.  Denies side effects form medication. Has not had chest pain, shortness of breath., palpitations, headache  or vision changes.    Continues on crestor for cholesterol.  Tolerating well.   ROS:  A comprehensive ROS was completed and negative except as noted per HPI  Allergies  Allergen Reactions   Flomax [Tamsulosin]     Dizziness, felt "funny"   Biaxin [Clarithromycin] Diarrhea    Past Medical History:  Diagnosis Date   Allergy    Blood in stool    Diverticulitis    GERD (gastroesophageal reflux disease)    CONTROLLED WITH DIET - NO MEDS   History of kidney stones    Hyperlipidemia    Hypertension    Migraines    OCULAR MIGRAINES   Sleep apnea    STATES SLEEP STUDY 4 OR 5 YEARS AGO - TOLD BORDERLINE - TRIED CPAP FOR 30 DAYS - STATES IT DID NOT HELP - NO LONGER USES.    Past Surgical History:  Procedure Laterality Date   APPENDECTOMY     COLON SURGERY     INGUINAL HERNIA REPAIR Right 12/28/2021   Procedure: LAPAROSCOPIC RIGHT INGUINAL HERNIA REPAIR WITH MESH;  Surgeon: Coralie Keens, MD;  Location: [facility];  Service: General;  Laterality: Right;   LAPAROSCOPIC PARTIAL COLECTOMY N/A 06/24/2014   Procedure: LAPAROSCOPIC ASSISTED  PARTIAL COLECTOMY;  Surgeon: Jackolyn Confer, MD;  Location: [facility];  Service: General;  Laterality: N/A;   NASAL RECONSTRUCTION WITH SEPTAL REPAIR     SPINE SURGERY  2001   rupture L4-L5   VASECTOMY      Social  History   Socioeconomic History   Marital status: Married    Spouse name: Not on file   Number of children: Not on file   Years of education: Not on file   Highest education level: Not on file  Occupational History   Not on file  Tobacco Use   Smoking status: Never    Passive exposure: Never   Smokeless tobacco: Never  Vaping Use   Vaping Use: Never used  Substance and Sexual Activity   Alcohol use: Yes    Comment: RARE ALCOHOL   Drug use: No   Sexual activity: Yes    Partners: Female  Other Topics Concern   Not on file  Social History Narrative   Caffeine: 1 cup/day   Social Determinants of Health   Financial Resource Strain: Not on file  Food Insecurity: Not on file  Transportation Needs: Not on file  Physical Activity: Not on file  Stress: Not on file  Social Connections: Not on file    Family History  Problem Relation Age of Onset   Arthritis Mother    Hyperlipidemia Mother    Stroke Mother    Hypertension Mother    Heart disease Mother        valvular heart disease ?  which valve.   Cancer Father 45       prostate   Hypertension Father    Cancer Maternal Grandmother        ovarian   Stroke Paternal Grandfather    Sleep apnea Neg Hx     Health Maintenance  Topic Date Due   Zoster Vaccines- Shingrix (2 of 2) 01/02/2022   COVID-19 Vaccine (5 - Booster for Moderna series) 05/23/2022 (Originally 09/21/2021)   INFLUENZA VACCINE  05/08/2022   COLONOSCOPY (Pts 45-24yr Insurance coverage will need to be confirmed)  09/26/2023   TETANUS/TDAP  07/28/2031   Hepatitis C Screening  Completed   HIV Screening  Completed   HPV VACCINES  Aged Out     ----------------------------------------------------------------------------------------------------------------------------------------------------------------------------------------------------------------- Physical Exam BP 122/77 (BP Location: Left Arm, Patient Position: Sitting, Cuff Size: Large)   Pulse (!)  52   Ht '5\' 9"'$  (1.753 m)   Wt 197 lb (89.4 kg)   SpO2 98%   BMI 29.09 kg/m   Physical Exam Constitutional:      Appearance: Normal appearance.  Eyes:     General: No scleral icterus.    Extraocular Movements: Extraocular movements intact.     Pupils: Pupils are equal, round, and reactive to light.  Cardiovascular:     Rate and Rhythm: Normal rate and regular rhythm.  Pulmonary:     Effort: Pulmonary effort is normal.     Breath sounds: Normal breath sounds.  Musculoskeletal:     Cervical back: Neck supple.  Neurological:     Mental Status: He is alert.  Psychiatric:        Mood and Affect: Mood normal.        Behavior: Behavior normal.     ------------------------------------------------------------------------------------------------------------------------------------------------------------------------------------------------------------------- Assessment and Plan  Ocular migraine Followed by neurology.  Maxalt if helpful, he will continue PRN.   Essential hypertension BP is well controlled today.  Recommend continuation of current medications.    Hyperlipidemia Tolerating crestor at current strength.  Recommend continuation.  Updating lipid panel.   Neck pain Given handout on home stretches.  He will see massage therapy as well. He will let me know if not improving.    No orders of the defined types were placed in this encounter.   Return in about 6 months (around 11/07/2022) for HTN.    This visit occurred during the SARS-CoV-2 public health emergency.  Safety protocols were in place, including screening questions prior to the visit, additional usage of staff PPE, and extensive cleaning of exam room while observing appropriate contact time as indicated for disinfecting solutions.

## 2022-05-07 NOTE — Patient Instructions (Signed)
You can try voltaren gel on the thumb as needed.  Continue current medications.

## 2022-05-07 NOTE — Telephone Encounter (Signed)
Received a fax from Sarah Barts OD at [facility] stating patient has Sup ONH edema, no pallor or hemorrhages of OS. 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 Recommend MRI of brain/orbits with contrast and fat suppression (if possible). Consider labs: CBC, ESR, ACE, Lyme antibody, FTA-ABS, RPR or VRDL.   Spoke with Dr Athar. Order has been placed for MRI brain w wo contrast and MRI orbits w wo contrast. Requested fat suppression if possible. 

## 2022-05-08 DIAGNOSIS — G4733 Obstructive sleep apnea (adult) (pediatric): Secondary | ICD-10-CM | POA: Diagnosis not present

## 2022-05-08 LAB — BASIC METABOLIC PANEL
BUN/Creatinine Ratio: 21 (calc) (ref 6–22)
BUN: 26 mg/dL — ABNORMAL HIGH (ref 7–25)
CO2: 31 mmol/L (ref 20–32)
Calcium: 9.5 mg/dL (ref 8.6–10.3)
Chloride: 102 mmol/L (ref 98–110)
Creat: 1.22 mg/dL (ref 0.70–1.35)
Glucose, Bld: 99 mg/dL (ref 65–99)
Potassium: 4 mmol/L (ref 3.5–5.3)
Sodium: 140 mmol/L (ref 135–146)

## 2022-05-08 LAB — HEPATIC FUNCTION PANEL
AG Ratio: 1.9 (calc) (ref 1.0–2.5)
ALT: 21 U/L (ref 9–46)
AST: 18 U/L (ref 10–35)
Albumin: 4.5 g/dL (ref 3.6–5.1)
Alkaline phosphatase (APISO): 56 U/L (ref 35–144)
Bilirubin, Direct: 0.2 mg/dL (ref 0.0–0.2)
Globulin: 2.4 g/dL (calc) (ref 1.9–3.7)
Indirect Bilirubin: 1.1 mg/dL (calc) (ref 0.2–1.2)
Total Bilirubin: 1.3 mg/dL — ABNORMAL HIGH (ref 0.2–1.2)
Total Protein: 6.9 g/dL (ref 6.1–8.1)

## 2022-05-08 LAB — LIPID PANEL W/REFLEX DIRECT LDL
Cholesterol: 129 mg/dL (ref <200)
HDL: 32 mg/dL — ABNORMAL LOW (ref >=40)
LDL Cholesterol (Calc): 69 mg/dL (calc)
Non-HDL Cholesterol (Calc): 97 mg/dL (calc) (ref <130)
Total CHOL/HDL Ratio: 4 (calc) (ref <5.0)
Triglycerides: 221 mg/dL — ABNORMAL HIGH (ref <150)

## 2022-05-08 LAB — TSH: TSH: 2.53 mIU/L (ref 0.40–4.50)

## 2022-05-09 ENCOUNTER — Ambulatory Visit: Payer: BC Managed Care – PPO

## 2022-05-09 DIAGNOSIS — H4602 Optic papillitis, left eye: Secondary | ICD-10-CM

## 2022-05-09 DIAGNOSIS — H47032 Optic nerve hypoplasia, left eye: Secondary | ICD-10-CM

## 2022-05-09 MED ORDER — GADOBENATE DIMEGLUMINE 529 MG/ML IV SOLN
18.0000 mL | Freq: Once | INTRAVENOUS | Status: AC | PRN
  Administered 2022-05-09: 18 mL via INTRAVENOUS

## 2022-05-10 ENCOUNTER — Telehealth: Payer: Self-pay | Admitting: Neurology

## 2022-05-10 DIAGNOSIS — H53482 Generalized contraction of visual field, left eye: Secondary | ICD-10-CM

## 2022-05-10 DIAGNOSIS — H47032 Optic nerve hypoplasia, left eye: Secondary | ICD-10-CM

## 2022-05-10 DIAGNOSIS — H4602 Optic papillitis, left eye: Secondary | ICD-10-CM

## 2022-05-10 DIAGNOSIS — H539 Unspecified visual disturbance: Secondary | ICD-10-CM

## 2022-05-10 MED ORDER — PREDNISONE 50 MG PO TABS: 1000.0000 mg | ORAL_TABLET | Freq: Every day | ORAL | 0 refills | Status: DC

## 2022-05-10 NOTE — Telephone Encounter (Signed)
As far as I know, I don't think there is any obvious connection.

## 2022-05-10 NOTE — Telephone Encounter (Signed)
Pt would like a call from the nurse to discuss referral for ophthalmology.

## 2022-05-10 NOTE — Telephone Encounter (Addendum)
Spoke with pt at 1:43 pm. We discussed that his MRIs were normal. Also discussed the message from Dr Rexene Alberts as noted below in detail. Pt is amenable to moving forward with referral to ophthalmology and high dose Prednisone treatment x 3 days, 1000 mg each day using 50 mg tablets. Pt is aware he will take 20 tablets per day with food x 3 days. Pt prefers [facility] on [address]. He had some additional questions. Per Dr Felecia Shelling, pt may notice improvement in 3-5 days however sometimes improvement doesn't come from steroid and it may take some time. He also said that there is no clear answer as to why this happens. Pt wanted to relay other questions to Dr Rexene Alberts such as the following: given his increase in ocular migraines, plus the event in June that led to blurred vision, and then now the symptoms of optic neuritis, is there any connection? He doesn't want to miss anything. He was very appreciative of the call.   Pt requested Dr Oliver Barre for ophthalmology. Order placed (ok per Dr Rexene Alberts).

## 2022-05-10 NOTE — Telephone Encounter (Signed)
Referral sent to Dr. Oliver Barre [phone]

## 2022-05-10 NOTE — Telephone Encounter (Signed)
Called pt & LVM (ok per DPR) advising patient that Dr Rexene Alberts is not aware of any connection between his ocular migraines, the blurred vision event in June, and then the optic neuritis symptoms now. Recommended he see how he does with steroids. Left office number for call back if he has any questions. Pt was also made aware in previous phone call today the he is welcome to call with any questions even if after hours.

## 2022-05-10 NOTE — Telephone Encounter (Signed)
I had a chance to discuss patient's symptoms with Dr. Felecia Shelling.  He also pulled up the scans 1 more time to make sure it that there were no subtle abnormalities missed.  While the scans were normal, since the patient has symptoms of papillitis, and he is still in the early part of his symptoms, it may be worth treating him with steroids.  We mutually agreed to consider 1 g of oral steroids x 3 days.  In addition, I would like to pursue a formal evaluation with an ophthalmologist, I would like to see if patient can be seen by Dr. Katy Fitch or Dr. Tama High.  Please discussed with patient that we can treat him empirically for symptom control with high-dose oral steroids.   I have placed a referral as well.  If agreeable, please send in prescription to pharmacy of choice: Prednisone 50 mg strength take 20 pills once daily in the morning (=1000 mg) for 3 days, 60 pills no refills.

## 2022-05-10 NOTE — Addendum Note (Signed)
Addended by: Gildardo Griffes on: 05/10/2022 03:28 PM   Modules accepted: Orders

## 2022-05-10 NOTE — Telephone Encounter (Signed)
I called the pt and LVM asking for call back.

## 2022-05-14 ENCOUNTER — Encounter: Payer: Self-pay | Admitting: Family Medicine

## 2022-05-14 NOTE — Telephone Encounter (Signed)
Resent referral for Dr. Clide Dales fax number [phone]

## 2022-05-15 MED ORDER — ROSUVASTATIN CALCIUM 5 MG PO TABS: 5.0000 mg | ORAL_TABLET | Freq: Every day | ORAL | 3 refills | Status: DC

## 2022-05-15 NOTE — Telephone Encounter (Signed)
Completed.

## 2022-05-28 ENCOUNTER — Telehealth: Payer: Self-pay | Admitting: Neurology

## 2022-05-28 DIAGNOSIS — H25813 Combined forms of age-related cataract, bilateral: Secondary | ICD-10-CM | POA: Diagnosis not present

## 2022-05-28 DIAGNOSIS — H47323 Drusen of optic disc, bilateral: Secondary | ICD-10-CM | POA: Diagnosis not present

## 2022-05-28 DIAGNOSIS — H47022 Hemorrhage in optic nerve sheath, left eye: Secondary | ICD-10-CM | POA: Diagnosis not present

## 2022-05-28 DIAGNOSIS — H4602 Optic papillitis, left eye: Secondary | ICD-10-CM | POA: Diagnosis not present

## 2022-05-28 NOTE — Telephone Encounter (Signed)
Dr. Oliver Barre would like a call back from Dr. Rexene Alberts in regards to the patient.  Contact info: [phone]

## 2022-05-28 NOTE — Telephone Encounter (Signed)
I had an extended conversation with Dr. Oliver Barre about patient's encounter today.  He explained that patient's wife called for an urgent appointment a couple of weeks ago. At that point he did not have any records unfortunately from our office.  He was not quite sure why he needed to see the patient urgently but was able to see him today, her vision was 20/20, he had some discrepancy in color vision in the right eye compared to left and possibility of remnants of papillitis on the left eye affecting a small area in the inferior nasal quadrant of the left papilla, questionable bleed, normal OCT.  He is agreeable to sending his records to our office and plans to see patient back in 3 to 4 weeks for checkup, no obvious visual field defect corresponds to the finding in the left eye.  Overall eye exam is quite benign with a possibility of previous papillitis.  Patient told Dr. Clide Dales that he had not actually taken the steroids, and he wanted to wait till after seeing him.  Dr. Clide Dales and I mutually agreed that it probably is not necessary for patient to take steroids at this moment in time as he had not taken them when he was actively having initial symptoms.  I explained to Dr. Clide Dales that he can redirect patient's phone call to our office if he pt should have additional questions about the steroid I prescribed on 05/10/22. At this point, in case he calls or emails, it is probably best for him to FU with PCP, to do labs for an underlying inflammatory condition/systemic rheumatological condition, as w/u from our end thus far, did not reveal an underlying primary neurological cause. Eye exam is, if anything, improving compared to earlier in August.

## 2022-06-08 DIAGNOSIS — G4733 Obstructive sleep apnea (adult) (pediatric): Secondary | ICD-10-CM | POA: Diagnosis not present

## 2022-06-12 ENCOUNTER — Other Ambulatory Visit: Payer: Self-pay

## 2022-06-12 DIAGNOSIS — E785 Hyperlipidemia, unspecified: Secondary | ICD-10-CM | POA: Diagnosis not present

## 2022-06-12 DIAGNOSIS — H539 Unspecified visual disturbance: Secondary | ICD-10-CM | POA: Diagnosis not present

## 2022-06-12 DIAGNOSIS — H4710 Unspecified papilledema: Secondary | ICD-10-CM | POA: Diagnosis not present

## 2022-06-12 MED ORDER — LOSARTAN POTASSIUM 50 MG PO TABS: ORAL_TABLET | ORAL | 1 refills | Status: DC

## 2022-06-13 ENCOUNTER — Telehealth: Payer: Self-pay

## 2022-06-13 ENCOUNTER — Telehealth: Payer: Self-pay | Admitting: Neurology

## 2022-06-13 NOTE — Telephone Encounter (Signed)
I called pt back.  PCP prescribes losartan.  He has been on for about 6 months.  Has call into his pcp (has not heard back).  Wife read that losartan can cause eye problems,  [facility]  06-12-2022 (notes - not is care everywhere yet) .  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 He is wanting your input about this.  I referred to pcp for bp control and also to contact [facility] as well thru portal.  He said he would.

## 2022-06-13 NOTE — Telephone Encounter (Signed)
He really did not have any evidence of a stroke on his recent MRIs.  I am not aware of losartan causing optic neuritis.

## 2022-06-13 NOTE — Telephone Encounter (Signed)
Pt lvm requesting a brief conversation with you concerning Losartan possibly causing his left eye issues. Pt also contacted Neurology concerning the same.

## 2022-06-13 NOTE — Telephone Encounter (Signed)
Wouldn't think losartan would  be the cause of his eye issue.

## 2022-06-13 NOTE — Telephone Encounter (Signed)
Pt is calling. Pt stated he wants to talk to a nurse about medication losartan (COZAAR) 50 MG tablet .  Pt stated he is having eye problems and stated it could be because of the medication losartan (COZAAR) 50 MG tablet he is taking

## 2022-06-14 NOTE — Telephone Encounter (Signed)
I spoke with the patient and gave him Dr. Guadelupe Sabin response back to his question.  He verbalized understanding. He states the eye specialist in [place] told him the likely cause could have been a small stroke that would not show up on the MRI. He also stated his wife had read that Losartan could cause issues with eyes including pain, swelling etc.  He asked if Dr Rexene Alberts would consult with PCP Dr Zigmund Daniel about the Losartan and see if it would be worth a trial off the medication to see if he gets better. He states he doesn't want to further lose any vision. He was told to follow-up with ophthalmology in a month but he doesn't feel he can wait another month. He started the Losartan in January and the eye problems started early June.   Office note from Dr Florene Glen (ophthalmology at [facility]) as been printed and given to Dr Rexene Alberts for review as well.

## 2022-06-14 NOTE — Telephone Encounter (Signed)
I called pt and LMVM that per losartan (coming off as a trial) will need to contact pcp until he see's opthamologist again, or may contact eye specialist as well (call or portal). And per Bethany's note Dr. Rexene Alberts did reach out to pcp and he was not aware of losartan causing any eye issues and his office will update him.  He can call back as needed.

## 2022-06-14 NOTE — Telephone Encounter (Signed)
Dr Rexene Alberts also reached out to pt's primary care who also is not aware of Losartan causing any eye issues and his office will update patient.

## 2022-06-14 NOTE — Telephone Encounter (Signed)
If he would like to come off the Losartan for now, he will have to talk to his PCP, until he sees his ophthalmologist again. He can also his eye specialist about it.

## 2022-06-15 ENCOUNTER — Ambulatory Visit: Payer: BC Managed Care – PPO | Admitting: Family Medicine

## 2022-06-15 ENCOUNTER — Encounter: Payer: Self-pay | Admitting: Family Medicine

## 2022-06-15 VITALS — BP 132/81 | Ht 69.0 in | Wt 198.0 lb

## 2022-06-15 DIAGNOSIS — K219 Gastro-esophageal reflux disease without esophagitis: Secondary | ICD-10-CM | POA: Insufficient documentation

## 2022-06-15 DIAGNOSIS — K2100 Gastro-esophageal reflux disease with esophagitis, without bleeding: Secondary | ICD-10-CM

## 2022-06-15 DIAGNOSIS — J069 Acute upper respiratory infection, unspecified: Secondary | ICD-10-CM | POA: Diagnosis not present

## 2022-06-15 NOTE — Assessment & Plan Note (Signed)
We discussed how to taper off of chronic PPIs.  He will need to taper down to every other day and probably do that for several months and then can taper down to every third day.  He can use an occasional Tums or Rolaids type product in between.  We also discussed that if he has difficulty going to every other day he can use an H2 blocker such as Pepcid,  on the in between days.

## 2022-06-15 NOTE — Progress Notes (Signed)
Acute Office Visit  Subjective:     Patient ID: Tim Gordon, male    DOB: August 13, 1961, 61 y.o.   MRN: [id number]  Chief Complaint  Patient presents with   Cough    HPI Patient is in today for ST, drainage, and slight cough.  He has had a mild headache.  Possible fever yesterday.  No GI symptoms.  Home test negative for COVID.  He also has some sinus congestion and chest congestion today.  He also had some questions about his losartan and whether or not it could cause optic neuritis.  He had an event recently where he was seen by his eye doctor and then referred to [facility] for some visual changes that occurred after a brief neurologic event.  He just wanted to make sure that the medication was not contributing.  He had started it last December.  He also need to discuss some literature that he read about chronic PPI use.  He has been on them for about 3 years he was previously on 40 mg but now on 20 mg.  ROS      Objective:    BP 132/81   Ht '5\' 9"'$  (1.753 m)   Wt 198 lb (89.8 kg)   SpO2 97%   BMI 29.24 kg/m    Physical Exam Constitutional:      Appearance: He is well-developed.  HENT:     Head: Normocephalic and atraumatic.     Right Ear: Tympanic membrane, ear canal and external ear normal.     Left Ear: Tympanic membrane, ear canal and external ear normal.     Nose: Nose normal.     Comments: + cobblestoning.   Eyes:     Conjunctiva/sclera: Conjunctivae normal.     Pupils: Pupils are equal, round, and reactive to light.  Neck:     Thyroid: No thyromegaly.  Cardiovascular:     Rate and Rhythm: Normal rate.     Heart sounds: Normal heart sounds.  Pulmonary:     Effort: Pulmonary effort is normal.     Breath sounds: Normal breath sounds.  Musculoskeletal:     Cervical back: Neck supple.  Lymphadenopathy:     Cervical: No cervical adenopathy.  Skin:    General: Skin is warm and dry.  Neurological:     Mental Status: He is alert and oriented to person,  place, and time.     No results found for any visits on 06/15/22.      Assessment & Plan:   Problem List Items Addressed This Visit       Digestive   GERD (gastroesophageal reflux disease)    We discussed how to taper off of chronic PPIs.  He will need to taper down to every other day and probably do that for several months and then can taper down to every third day.  He can use an occasional Tums or Rolaids type product in between.  We also discussed that if he has difficulty going to every other day he can use an H2 blocker such as Pepcid,  on the in between days.      Other Visit Diagnoses     Viral upper respiratory tract infection    -  Primary      Upper respiratory infection-gave reassurance okay to continue conservative measures.  If not better after the weekend then please let us know.  I did discuss with him that I was not aware of any connection with losartan  and optic neuritis but I am certainly happy to do a little research and see if there is any connection that I am not aware of.  No orders of the defined types were placed in this encounter.   No follow-ups on file.  Beatrice Lecher, MD

## 2022-06-18 ENCOUNTER — Encounter: Payer: Self-pay | Admitting: Family Medicine

## 2022-06-18 ENCOUNTER — Telehealth: Payer: Self-pay | Admitting: *Deleted

## 2022-06-18 MED ORDER — AMOXICILLIN-POT CLAVULANATE 875-125 MG PO TABS: 1.0000 | ORAL_TABLET | Freq: Two times a day (BID) | ORAL | 0 refills | Status: DC

## 2022-06-18 NOTE — Telephone Encounter (Signed)
Meds ordered this encounter  ?Medications  ? amoxicillin-clavulanate (AUGMENTIN) 875-125 MG tablet  ?  Sig: Take 1 tablet by mouth 2 (two) times daily.  ?  Dispense:  14 tablet  ?  Refill:  0  ? ? ?

## 2022-06-18 NOTE — Telephone Encounter (Signed)
Spoke w/pt and informed him that Dr. Madilyn Fireman is calling in an ABX for him to take. Pt was advised that if he did not get any better to call and f/u with his pcp or another provider.

## 2022-06-22 DIAGNOSIS — G4733 Obstructive sleep apnea (adult) (pediatric): Secondary | ICD-10-CM | POA: Diagnosis not present

## 2022-06-26 DIAGNOSIS — H47012 Ischemic optic neuropathy, left eye: Secondary | ICD-10-CM | POA: Diagnosis not present

## 2022-06-26 DIAGNOSIS — H53432 Sector or arcuate defects, left eye: Secondary | ICD-10-CM | POA: Diagnosis not present

## 2022-07-08 DIAGNOSIS — G4733 Obstructive sleep apnea (adult) (pediatric): Secondary | ICD-10-CM | POA: Diagnosis not present

## 2022-07-10 DIAGNOSIS — N400 Enlarged prostate without lower urinary tract symptoms: Secondary | ICD-10-CM | POA: Diagnosis not present

## 2022-08-08 DIAGNOSIS — G4733 Obstructive sleep apnea (adult) (pediatric): Secondary | ICD-10-CM | POA: Diagnosis not present

## 2022-08-28 DIAGNOSIS — H53432 Sector or arcuate defects, left eye: Secondary | ICD-10-CM | POA: Diagnosis not present

## 2022-08-28 DIAGNOSIS — Z881 Allergy status to other antibiotic agents status: Secondary | ICD-10-CM | POA: Diagnosis not present

## 2022-08-28 DIAGNOSIS — H47012 Ischemic optic neuropathy, left eye: Secondary | ICD-10-CM | POA: Diagnosis not present

## 2022-09-07 DIAGNOSIS — G4733 Obstructive sleep apnea (adult) (pediatric): Secondary | ICD-10-CM | POA: Diagnosis not present

## 2022-09-13 ENCOUNTER — Encounter: Payer: Self-pay | Admitting: Neurology

## 2022-09-13 MED ORDER — PROPRANOLOL HCL ER 60 MG PO CP24: 60.0000 mg | ORAL_CAPSULE | Freq: Every day | ORAL | 1 refills | Status: DC

## 2022-10-08 DIAGNOSIS — G4733 Obstructive sleep apnea (adult) (pediatric): Secondary | ICD-10-CM | POA: Diagnosis not present

## 2022-11-05 ENCOUNTER — Telehealth (INDEPENDENT_AMBULATORY_CARE_PROVIDER_SITE_OTHER): Payer: BC Managed Care – PPO | Admitting: Family Medicine

## 2022-11-05 DIAGNOSIS — U071 COVID-19: Secondary | ICD-10-CM | POA: Diagnosis not present

## 2022-11-05 MED ORDER — NIRMATRELVIR/RITONAVIR (PAXLOVID)TABLET: 3.0000 | ORAL_TABLET | Freq: Two times a day (BID) | ORAL | 0 refills | Status: AC

## 2022-11-05 NOTE — Progress Notes (Signed)
    Virtual Visit via Video Note  I connected with Tim Gordon on 11/05/22 at  1:20 PM EST by a video enabled telemedicine application and verified that I am speaking with the correct person using two identifiers.   I discussed the limitations of evaluation and management by telemedicine and the availability of in person appointments. The patient expressed understanding and agreed to proceed.  Patient location: at home Provider location: in office  Subjective:    CC:   Chief Complaint  Patient presents with   Covid Positive    HPI: Started with cold sxs on Friday and felt worse on Sat. Took OTC meds.  + sick contacts at work w/ COVID. Did home COVID test at home.  Today has a fever. 100. 2 + nasal congestion, sneezing, HA.  Mild cough.  More frequent BMs. Has been hospitalized with COVID.    Past medical history, Surgical history, Family history not pertinant except as noted below, Social history, Allergies, and medications have been entered into the medical record, reviewed, and corrections made.    Objective:    General: Speaking clearly in complete sentences without any shortness of breath.  Alert and oriented x3.  Normal judgment. No apparent acute distress.    Impression and Recommendations:    Problem List Items Addressed This Visit   None Visit Diagnoses     COVID-19    -  Primary   Relevant Medications   nirmatrelvir/ritonavir (PAXLOVID) 20 x 150 MG & 10 x '100MG'$  TABS      Discussed quarantine guidelines and symptomatic care.  Nre rx sent. Call if not better or getting worse.   No orders of the defined types were placed in this encounter.   Meds ordered this encounter  Medications   nirmatrelvir/ritonavir (PAXLOVID) 20 x 150 MG & 10 x '100MG'$  TABS    Sig: Take 3 tablets by mouth 2 (two) times daily for 5 days. (Take nirmatrelvir 150 mg two tablets twice daily for 5 days and ritonavir 100 mg one tablet twice daily for 5 days) Patient GFR is 79    Dispense:   30 tablet    Refill:  0     I discussed the assessment and treatment plan with the patient. The patient was provided an opportunity to ask questions and all were answered. The patient agreed with the plan and demonstrated an understanding of the instructions.   The patient was advised to call back or seek an in-person evaluation if the symptoms worsen or if the condition fails to improve as anticipated.   Beatrice Lecher, MD

## 2022-11-07 ENCOUNTER — Ambulatory Visit: Payer: BC Managed Care – PPO | Admitting: Family Medicine

## 2022-11-08 DIAGNOSIS — G4733 Obstructive sleep apnea (adult) (pediatric): Secondary | ICD-10-CM | POA: Diagnosis not present

## 2022-11-21 ENCOUNTER — Ambulatory Visit: Payer: BC Managed Care – PPO | Admitting: Family Medicine

## 2022-11-28 ENCOUNTER — Telehealth: Payer: Self-pay | Admitting: Family Medicine

## 2022-11-28 NOTE — Telephone Encounter (Signed)
LVM and sent mychart msg informing pt of need to reschedule 03/13/23 appointment - NP out

## 2022-11-30 ENCOUNTER — Encounter: Payer: Self-pay | Admitting: Family Medicine

## 2022-11-30 ENCOUNTER — Ambulatory Visit: Payer: BC Managed Care – PPO | Admitting: Family Medicine

## 2022-11-30 VITALS — BP 130/82 | HR 60 | Ht 69.0 in | Wt 198.0 lb

## 2022-11-30 DIAGNOSIS — E782 Mixed hyperlipidemia: Secondary | ICD-10-CM

## 2022-11-30 DIAGNOSIS — E538 Deficiency of other specified B group vitamins: Secondary | ICD-10-CM

## 2022-11-30 DIAGNOSIS — I10 Essential (primary) hypertension: Secondary | ICD-10-CM

## 2022-11-30 DIAGNOSIS — D485 Neoplasm of uncertain behavior of skin: Secondary | ICD-10-CM

## 2022-11-30 DIAGNOSIS — R3916 Straining to void: Secondary | ICD-10-CM

## 2022-11-30 DIAGNOSIS — R413 Other amnesia: Secondary | ICD-10-CM

## 2022-11-30 DIAGNOSIS — N401 Enlarged prostate with lower urinary tract symptoms: Secondary | ICD-10-CM | POA: Diagnosis not present

## 2022-11-30 DIAGNOSIS — G43109 Migraine with aura, not intractable, without status migrainosus: Secondary | ICD-10-CM

## 2022-11-30 DIAGNOSIS — F5101 Primary insomnia: Secondary | ICD-10-CM

## 2022-11-30 MED ORDER — HYDROCHLOROTHIAZIDE 25 MG PO TABS: 25.0000 mg | ORAL_TABLET | Freq: Every morning | ORAL | 3 refills | Status: DC

## 2022-11-30 MED ORDER — LOSARTAN POTASSIUM 50 MG PO TABS: ORAL_TABLET | ORAL | 3 refills | Status: DC

## 2022-11-30 MED ORDER — PROPRANOLOL HCL ER 60 MG PO CP24: 60.0000 mg | ORAL_CAPSULE | Freq: Every day | ORAL | 3 refills | Status: DC

## 2022-11-30 NOTE — Progress Notes (Signed)
Tim Gordon - 62 y.o. male MRN [id number]  Date of birth: 06-01-1961  Subjective No chief complaint on file.   HPI Tim Gordon is a 62 y.o. male here today for follow up.   He reports that he is ***.  Continues on losartan, hctz and propranolol for management of HTN.  He is tolerating medication well without significant side effects.    Tremor and migraines are stable with propranolol.  Tolerating crestor well at current strength.   Allergies  Allergen Reactions  . Flomax [Tamsulosin]     Dizziness, felt "funny"  . Biaxin [Clarithromycin] Diarrhea    Past Medical History:  Diagnosis Date  . Allergy   . Blood in stool   . Diverticulitis   . GERD (gastroesophageal reflux disease)    CONTROLLED WITH DIET - NO MEDS  . History of kidney stones   . Hyperlipidemia   . Hypertension   . Migraines    OCULAR MIGRAINES  . Sleep apnea    STATES SLEEP STUDY 4 OR 5 YEARS AGO - TOLD BORDERLINE - TRIED CPAP FOR 30 DAYS - STATES IT DID NOT HELP - NO LONGER USES.    Past Surgical History:  Procedure Laterality Date  . APPENDECTOMY    . COLON SURGERY    . INGUINAL HERNIA REPAIR Right 12/28/2021   Procedure: LAPAROSCOPIC RIGHT INGUINAL HERNIA REPAIR WITH MESH;  Surgeon: Coralie Keens, MD;  Location: [facility];  Service: General;  Laterality: Right;  . LAPAROSCOPIC PARTIAL COLECTOMY N/A 06/24/2014   Procedure: LAPAROSCOPIC ASSISTED  PARTIAL COLECTOMY;  Surgeon: Jackolyn Confer, MD;  Location: [facility];  Service: General;  Laterality: N/A;  . NASAL RECONSTRUCTION WITH SEPTAL REPAIR    . SPINE SURGERY  2001   rupture L4-L5  . VASECTOMY      Social History   Socioeconomic History  . Marital status: Married    Spouse name: Not on file  . Number of children: Not on file  . Years of education: Not on file  . Highest education level: Not on file  Occupational History  . Not on file  Tobacco Use  . Smoking status: Never    Passive exposure: Never  . Smokeless tobacco: Never  Vaping  Use  . Vaping Use: Never used  Substance and Sexual Activity  . Alcohol use: Yes    Comment: RARE ALCOHOL  . Drug use: No  . Sexual activity: Yes    Partners: Female  Other Topics Concern  . Not on file  Social History Narrative   Caffeine: 1 cup/day   Social Determinants of Health   Financial Resource Strain: Not on file  Food Insecurity: Not on file  Transportation Needs: Not on file  Physical Activity: Not on file  Stress: Not on file  Social Connections: Not on file    Family History  Problem Relation Age of Onset  . Arthritis Mother   . Hyperlipidemia Mother   . Stroke Mother   . Hypertension Mother   . Heart disease Mother        valvular heart disease ? which valve.  . Cancer Father 1       prostate  . Hypertension Father   . Cancer Maternal Grandmother        ovarian  . Stroke Paternal Grandfather   . Sleep apnea Neg Hx     Health Maintenance  Topic Date Due  . COVID-19 Vaccine (5 - 2023-24 season) 06/08/2022  . INFLUENZA VACCINE  01/06/2023 (Originally 05/08/2022)  .  COLONOSCOPY (Pts 45-58yr Insurance coverage will need to be confirmed)  09/26/2023  . DTaP/Tdap/Td (2 - Td or Tdap) 07/28/2031  . Hepatitis C Screening  Completed  . HIV Screening  Completed  . Zoster Vaccines- Shingrix  Completed  . HPV VACCINES  Aged Out     ----------------------------------------------------------------------------------------------------------------------------------------------------------------------------------------------------------------- Physical Exam There were no vitals taken for this visit.  Physical Exam  ------------------------------------------------------------------------------------------------------------------------------------------------------------------------------------------------------------------- Assessment and Plan  No problem-specific Assessment & Plan notes found for this encounter.   No orders of the defined types were placed  in this encounter.   No follow-ups on file.    This visit occurred during the SARS-CoV-2 public health emergency.  Safety protocols were in place, including screening questions prior to the visit, additional usage of staff PPE, and extensive cleaning of exam room while observing appropriate contact time as indicated for disinfecting solutions.

## 2022-11-30 NOTE — Patient Instructions (Signed)
You can try magnesium glycinate 250-'500mg'$  daily to help with sleep.  Valerian root may also be helpful.  See me again in 6 months.

## 2022-12-01 LAB — LIPID PANEL W/REFLEX DIRECT LDL
Cholesterol: 207 mg/dL — ABNORMAL HIGH (ref <200)
HDL: 39 mg/dL — ABNORMAL LOW (ref >=40)
Non-HDL Cholesterol (Calc): 168 mg/dL (calc) — ABNORMAL HIGH (ref <130)
Total CHOL/HDL Ratio: 5.3 (calc) — ABNORMAL HIGH (ref <5.0)
Triglycerides: 421 mg/dL — ABNORMAL HIGH (ref <150)

## 2022-12-01 LAB — DIRECT LDL: Direct LDL: 101 mg/dL — ABNORMAL HIGH (ref <100)

## 2022-12-01 LAB — PSA: PSA: 2 ng/mL (ref <=4.00)

## 2022-12-01 LAB — VITAMIN B12: Vitamin B-12: 527 pg/mL (ref 200–1100)

## 2022-12-02 DIAGNOSIS — E538 Deficiency of other specified B group vitamins: Secondary | ICD-10-CM | POA: Insufficient documentation

## 2022-12-02 DIAGNOSIS — G4700 Insomnia, unspecified: Secondary | ICD-10-CM | POA: Insufficient documentation

## 2022-12-02 DIAGNOSIS — D485 Neoplasm of uncertain behavior of skin: Secondary | ICD-10-CM | POA: Insufficient documentation

## 2022-12-02 NOTE — Assessment & Plan Note (Signed)
He referred to try natural means first.  Recommend addition of magnesium supplement.  Alternatively could try valerian root as needed.  We discussed prescription options if he is not effective.

## 2022-12-02 NOTE — Assessment & Plan Note (Signed)
Stable at this time.  Update PSA.

## 2022-12-02 NOTE — Assessment & Plan Note (Signed)
Blood pressure is well-controlled at this time.  Recommend continuation of current medications for management of hypertension.  Low-sodium diet and active lifestyle encouraged.

## 2022-12-02 NOTE — Assessment & Plan Note (Signed)
Has discontinued rosuvastatin.  Recheck lipid panel today.

## 2022-12-02 NOTE — Assessment & Plan Note (Signed)
Referral placed to dermatology

## 2022-12-02 NOTE — Assessment & Plan Note (Signed)
Continue propranolol at current strength.  Maxalt as needed.

## 2022-12-02 NOTE — Assessment & Plan Note (Signed)
He has had some occasional changes with short-term memory.  Recheck B12 levels.

## 2022-12-13 DIAGNOSIS — K5730 Diverticulosis of large intestine without perforation or abscess without bleeding: Secondary | ICD-10-CM | POA: Diagnosis not present

## 2022-12-13 DIAGNOSIS — I10 Essential (primary) hypertension: Secondary | ICD-10-CM | POA: Diagnosis not present

## 2022-12-13 DIAGNOSIS — K5732 Diverticulitis of large intestine without perforation or abscess without bleeding: Secondary | ICD-10-CM | POA: Diagnosis not present

## 2022-12-13 DIAGNOSIS — R1032 Left lower quadrant pain: Secondary | ICD-10-CM | POA: Diagnosis not present

## 2023-01-04 DIAGNOSIS — G4733 Obstructive sleep apnea (adult) (pediatric): Secondary | ICD-10-CM | POA: Diagnosis not present

## 2023-02-06 DIAGNOSIS — H47212 Primary optic atrophy, left eye: Secondary | ICD-10-CM | POA: Diagnosis not present

## 2023-03-11 DIAGNOSIS — L821 Other seborrheic keratosis: Secondary | ICD-10-CM | POA: Diagnosis not present

## 2023-03-11 DIAGNOSIS — D225 Melanocytic nevi of trunk: Secondary | ICD-10-CM | POA: Diagnosis not present

## 2023-03-11 DIAGNOSIS — L578 Other skin changes due to chronic exposure to nonionizing radiation: Secondary | ICD-10-CM | POA: Diagnosis not present

## 2023-03-11 DIAGNOSIS — L570 Actinic keratosis: Secondary | ICD-10-CM | POA: Diagnosis not present

## 2023-03-11 DIAGNOSIS — L814 Other melanin hyperpigmentation: Secondary | ICD-10-CM | POA: Diagnosis not present

## 2023-03-13 ENCOUNTER — Other Ambulatory Visit: Payer: Self-pay | Admitting: Family Medicine

## 2023-03-13 ENCOUNTER — Ambulatory Visit: Payer: BC Managed Care – PPO | Admitting: Family Medicine

## 2023-03-13 ENCOUNTER — Encounter: Payer: Self-pay | Admitting: Family Medicine

## 2023-03-13 VITALS — BP 106/68 | HR 72 | Temp 98.6°F | Ht 69.0 in | Wt 195.0 lb

## 2023-03-13 DIAGNOSIS — J189 Pneumonia, unspecified organism: Secondary | ICD-10-CM | POA: Diagnosis not present

## 2023-03-13 MED ORDER — ALBUTEROL SULFATE HFA 108 (90 BASE) MCG/ACT IN AERS: 2.0000 | INHALATION_SPRAY | Freq: Four times a day (QID) | RESPIRATORY_TRACT | 0 refills | Status: DC | PRN

## 2023-03-13 MED ORDER — PREDNISONE 50 MG PO TABS: ORAL_TABLET | ORAL | 0 refills | Status: DC

## 2023-03-13 MED ORDER — HYDROCODONE BIT-HOMATROP MBR 5-1.5 MG/5ML PO SOLN: 5.0000 mL | Freq: Three times a day (TID) | ORAL | 0 refills | Status: DC | PRN

## 2023-03-13 MED ORDER — DOXYCYCLINE HYCLATE 100 MG PO TABS: 100.0000 mg | ORAL_TABLET | Freq: Two times a day (BID) | ORAL | 0 refills | Status: DC

## 2023-03-13 NOTE — Progress Notes (Signed)
Tim Gordon - 62 y.o. male MRN [id number]  Date of birth: 10-Dec-1960  Subjective Chief Complaint  Patient presents with   chest congestion    HPI Tim Gordon is a 62 y.o. male here today with complaint of chest and sinus congestion.  Had headache, fever and chills at initial onset.   Symptoms started about 4 days ago.  He has tried OTC cold and sinus medication and netti pot.  His sinus congestion has improved however still with chest congestion.  He has not had any further fevers but he does feel quite fatigued.    ROS:  A comprehensive ROS was completed and negative except as noted per HPI  Allergies  Allergen Reactions   Flomax [Tamsulosin]     Dizziness, felt "funny"   Biaxin [Clarithromycin] Diarrhea    Past Medical History:  Diagnosis Date   Allergy    Blood in stool    Diverticulitis    GERD (gastroesophageal reflux disease)    CONTROLLED WITH DIET - NO MEDS   History of kidney stones    Hyperlipidemia    Hypertension    Migraines    OCULAR MIGRAINES   Sleep apnea    STATES SLEEP STUDY 4 OR 5 YEARS AGO - TOLD BORDERLINE - TRIED CPAP FOR 30 DAYS - STATES IT DID NOT HELP - NO LONGER USES.    Past Surgical History:  Procedure Laterality Date   APPENDECTOMY     COLON SURGERY     INGUINAL HERNIA REPAIR Right 12/28/2021   Procedure: LAPAROSCOPIC RIGHT INGUINAL HERNIA REPAIR WITH MESH;  Surgeon: Abigail Miyamoto, MD;  Location: [facility];  Service: General;  Laterality: Right;   LAPAROSCOPIC PARTIAL COLECTOMY N/A 06/24/2014   Procedure: LAPAROSCOPIC ASSISTED  PARTIAL COLECTOMY;  Surgeon: Avel Peace, MD;  Location: [facility];  Service: General;  Laterality: N/A;   NASAL RECONSTRUCTION WITH SEPTAL REPAIR     SPINE SURGERY  2001   rupture L4-L5   VASECTOMY      Social History   Socioeconomic History   Marital status: Married    Spouse name: Not on file   Number of children: Not on file   Years of education: Not on file   Highest education level: Not on file   Occupational History   Not on file  Tobacco Use   Smoking status: Never    Passive exposure: Never   Smokeless tobacco: Never  Vaping Use   Vaping Use: Never used  Substance and Sexual Activity   Alcohol use: Yes    Comment: RARE ALCOHOL   Drug use: No   Sexual activity: Yes    Partners: Female  Other Topics Concern   Not on file  Social History Narrative   Caffeine: 1 cup/day   Social Determinants of Health   Financial Resource Strain: Not on file  Food Insecurity: Not on file  Transportation Needs: Not on file  Physical Activity: Not on file  Stress: Not on file  Social Connections: Not on file    Family History  Problem Relation Age of Onset   Arthritis Mother    Hyperlipidemia Mother    Stroke Mother    Hypertension Mother    Heart disease Mother        valvular heart disease ? which valve.   Cancer Father 67       prostate   Hypertension Father    Cancer Maternal Grandmother        ovarian   Stroke Paternal Grandfather  Sleep apnea Neg Hx     Health Maintenance  Topic Date Due   COVID-19 Vaccine (5 - 2023-24 season) 12/16/2023 (Originally 06/08/2022)   INFLUENZA VACCINE  05/09/2023   Colonoscopy  09/26/2023   DTaP/Tdap/Td (2 - Td or Tdap) 07/28/2031   Hepatitis C Screening  Completed   HIV Screening  Completed   Zoster Vaccines- Shingrix  Completed   HPV VACCINES  Aged Out     ----------------------------------------------------------------------------------------------------------------------------------------------------------------------------------------------------------------- Physical Exam BP 106/68 (BP Location: Left Arm, Patient Position: Sitting, Cuff Size: Normal)   Pulse 72   Temp 98.6 F (37 C) (Oral)   Ht 5\' 9"  (1.753 m)   Wt 195 lb (88.5 kg)   SpO2 97%   BMI 28.80 kg/m   Physical Exam Constitutional:      Appearance: Normal appearance.  HENT:     Head: Normocephalic and atraumatic.  Eyes:     General: No scleral  icterus. Cardiovascular:     Rate and Rhythm: Normal rate and regular rhythm.  Pulmonary:     Breath sounds: Wheezing present.     Comments: LLL diminished breath sounds with rhonchi.  Neurological:     Mental Status: He is alert.  Psychiatric:        Mood and Affect: Mood normal.        Behavior: Behavior normal.     ------------------------------------------------------------------------------------------------------------------------------------------------------------------------------------------------------------------- Assessment and Plan  Atypical pneumonia Exam consistent with pneumonia, likely atypical based on his clinical presentation.  Start doxycycline and prednisone.  Albuterol as needed. Hycodan as needed.  Add mucinex.  Contact clinic if having worsening symptoms.    Meds ordered this encounter  Medications   doxycycline (VIBRA-TABS) 100 MG tablet    Sig: Take 1 tablet (100 mg total) by mouth 2 (two) times daily.    Dispense:  20 tablet    Refill:  0   predniSONE (DELTASONE) 50 MG tablet    Sig: Take 50mg  daily x5 days.    Dispense:  5 tablet    Refill:  0   albuterol (VENTOLIN HFA) 108 (90 Base) MCG/ACT inhaler    Sig: Inhale 2 puffs into the lungs every 6 (six) hours as needed for wheezing or shortness of breath.    Dispense:  8 g    Refill:  0   HYDROcodone bit-homatropine (HYCODAN) 5-1.5 MG/5ML syrup    Sig: Take 5 mLs by mouth every 8 (eight) hours as needed for cough.    Dispense:  120 mL    Refill:  0    No follow-ups on file.    This visit occurred during the SARS-CoV-2 public health emergency.  Safety protocols were in place, including screening questions prior to the visit, additional usage of staff PPE, and extensive cleaning of exam room while observing appropriate contact time as indicated for disinfecting solutions.

## 2023-03-13 NOTE — Assessment & Plan Note (Signed)
Exam consistent with pneumonia, likely atypical based on his clinical presentation.  Start doxycycline and prednisone.  Albuterol as needed. Hycodan as needed.  Add mucinex.  Contact clinic if having worsening symptoms.

## 2023-03-13 NOTE — Patient Instructions (Signed)
Stay well hydrated.  Add mucinex.  Start doxycycline and prednisone.  Albuterol inhaler as needed.  Cough syrup as needed.  Let me know if symptoms worsen or are not improving over the next few days.

## 2023-03-21 ENCOUNTER — Telehealth: Payer: Self-pay | Admitting: Neurology

## 2023-03-21 ENCOUNTER — Ambulatory Visit: Payer: BC Managed Care – PPO | Admitting: Neurology

## 2023-03-21 ENCOUNTER — Encounter: Payer: Self-pay | Admitting: Neurology

## 2023-03-21 VITALS — BP 118/73 | HR 55 | Ht 69.0 in | Wt 192.0 lb

## 2023-03-21 DIAGNOSIS — G250 Essential tremor: Secondary | ICD-10-CM

## 2023-03-21 DIAGNOSIS — G43109 Migraine with aura, not intractable, without status migrainosus: Secondary | ICD-10-CM

## 2023-03-21 DIAGNOSIS — F439 Reaction to severe stress, unspecified: Secondary | ICD-10-CM

## 2023-03-21 DIAGNOSIS — G4700 Insomnia, unspecified: Secondary | ICD-10-CM

## 2023-03-21 DIAGNOSIS — R419 Unspecified symptoms and signs involving cognitive functions and awareness: Secondary | ICD-10-CM | POA: Diagnosis not present

## 2023-03-21 DIAGNOSIS — G4733 Obstructive sleep apnea (adult) (pediatric): Secondary | ICD-10-CM | POA: Diagnosis not present

## 2023-03-21 NOTE — Patient Instructions (Addendum)
It was nice to see you both today.  As discussed, we will pursue a formal neuropsychological evaluation (aka cognitive testing) for your memory complaints. This requires a referral to a trained and licensed neuropsychologist and will be a separate appointment at a different clinic.  Please continue using your autoPAP regularly. While your insurance requires that you use PAP at least 4 hours each night on 70% of the nights, I recommend, that you not skip any nights and use it throughout the night if you can. Getting used to PAP and staying with the treatment long term does take time and patience and discipline. Untreated obstructive sleep apnea when it is moderate to severe can have an adverse impact on cardiovascular health and raise her risk for heart disease, arrhythmias, hypertension, congestive heart failure, stroke and diabetes. Untreated obstructive sleep apnea causes sleep disruption, nonrestorative sleep, and sleep deprivation. This can have an impact on your day to day functioning and cause daytime sleepiness and impairment of cognitive function, memory loss, mood disturbance, and problems focussing. Using PAP regularly can improve these symptoms. You can try Melatonin at night for sleep: take 3 to 5 mg, one to 2 hours before your bedtime. You can go up to 10 mg if needed. It is over the counter and comes in pill form, chewable form and spray, if you prefer.   Please talk to your primary care about stress and anxiety management and the possibility of trying a low-dose antidepressant, please consider counseling as well. Please make an appointment with your urologist as you still have problems urinating at night. We will plan to follow-up routinely in 1 year, sooner if needed.

## 2023-03-21 NOTE — Telephone Encounter (Signed)
Referral sent in Epic to [facility]: Phone: [phone]  Fax: [phone]

## 2023-03-21 NOTE — Progress Notes (Signed)
Subjective:    Patient ID: Tim Gordon is a 62 y.o. male.  HPI    Interim history:   Tim Gordon is a 62 year old right-handed gentleman with an underlying medical history of reflux disease, kidney stones, migraine headaches, ocular migraines, nonarteritic AION of the left eye, hyperlipidemia, allergies, essential tremor, diverticulitis, dizziness, sleep apnea and overweight state, who presents for follow-up consultation of his sleep apnea, on AutoPap therapy and difficulty sleeping.  The patient is accompanied by his wife today.  I last saw him on 03/27/2022 for a sooner than scheduled visit due to visual disturbance bilaterally.  He had gone to the emergency room.  His exam was benign.  He was advised to continue with propranolol for his essential tremor and with his AutoPap machine.  He had an interim consultation with neuro-ophthalmology in September 2023 and a follow-up in November 2023.  He saw Dr. Gentry Roch through [facility].  Today, 03/21/2023: I reviewed his AutoPap compliance data from 02/17/2023 through 03/18/2023, which is a total of 30 days, during which time he used his machine every night with percent use days greater than 4 hours at 97%, indicating excellent compliance with an average usage of 6 hours and 29 minutes, residual AHI at goal at 1.4/h, 95th percentile of pressure at 6.9 cm with a range of 5 to 10 cm with EPR of 3.  Leak on the low side with the 95th percentile at 0.5 L/min.  He reports not sleeping well currently.  He has trouble staying asleep.  He has not tried melatonin recently but it did not seem to help in the past.  He tolerates the mask and the pressure but humidity seems to be high at times it makes the mask too moist.  He has adjusted the moisture level a little bit.  He is willing to tweak the moisture level down even more.  He has had 2 ocular migraines recently.  His wife is worried about his forgetfulness which has been more noticeable in  the past 6 months.  His B12 level was checked recently, it was 527 on 11/30/2022.  His cholesterol panel showed elevated triglycerides at 421.  He had a normal PSA at the time.  He has had nocturia.  He has seen urology, he tried Flomax but it caused side effects.  He has an appointment pending for August. He does endorse stress, especially what with his youngest daughter being in the hospital currently.  I had ordered a brain MRI with and without contrast as well as orbital MRI with and without contrast last year.  He had his scans on 05/09/2022 and I reviewed the results:  IMPRESSION: This is a normal MRI of the brain with and without contrast.   IMPRESSION: This is a normal MRI of the orbits with and without contrast.      His tremor is under good control with propranolol long-acting 60 mg daily.  He has not talked to his primary care about stress and anxiety management, he has not been on any antidepressant medication or anxiety medication recently, years ago he was on clonazepam but weaned himself off of it.  He was diagnosed with pneumonia recently, he is finishing antibiotics, he has finished his prednisone course.  He hydrates well with water.  His GI specialist wants him to drink at least 80 ounces of water per day.   The patient's allergies, current medications, family history, past medical history, past social history, past surgical history and  problem list were reviewed and updated as appropriate.    Previously:    I saw him on 03/06/2022 for sleep apnea follow-up, at which time he was compliant with his AutoPap.  He had some adjustment issues with the interface.  He reported intermittent chest soreness.  He reported feeling better rested and nocturia had improved.  He felt that propranolol 40 mg once daily it was helpful for his essential tremor.  Of note, he presented to the emergency room through [facility] on 03/23/2022 with blurry vision and dizziness.  I reviewed  the emergency room records.  Visual symptoms lasted about 15 to 20 minutes.  He also described a visual scotoma.  He had a head CT without contrast on 03/23/2022 and I reviewed the results: Impression: No acute intracranial abnormality.  He had a CT angiogram head and neck on 03/23/2022 and I reviewed the results: Impression: CTA HEAD NECK  1.  No large vessel occlusion identified.  2.  No aneurysm or dissection identified.    Degree of stenosis is determined using NASCET measurement technique:  Severe: 70-99%  Moderate: 50-69%.  Mild: Less than 50%.  CMP was benign with the exception of slightly low potassium at 3.5, albumin slightly elevated at 5.3.  CBC with differential showed slightly low WBC at 3.8, otherwise unremarkable.  He had a portable chest x-ray on 03/23/2022 which showed no acute cardiopulmonary disease.  EKG showed sinus bradycardia.        I evaluated him on 10/26/2021 for 2 episodes of dizziness in the recent past.  He had also noticed worsening tremor.  He had tried an oral appliance for sleep apnea in the past but could not tolerate it.  We talked about his symptoms at length.  He was advised to continue with propranolol for migraine prevention and symptomatic control of his essential tremor.  He was advised to increase it to 20 mg twice daily.  He was advised to proceed with repeat sleep testing.  He had a home sleep test on 11/27/2021 which indicated severe obstructive sleep apnea by number of events with an AHI of 32.4/h, O2 nadir 86% with mild to moderate intermittent snoring detected.  He was advised to start AutoPap therapy.  His set up date was 12/06/2021.  He has a ResMed air sense 11 AutoSet machine.   I reviewed his AutoPap compliance data from 01/30/2022 through 02/28/2022, which is a total of 30 days, during which time he used his machine every night with percent use days greater than 4 hours at 100%, indicating superb compliance with an average usage of 6 hours and 29 minutes,  residual AHI at goal at 1.2/h, average pressure for the 95th percentile at 7.1 cm with a range of 5 to 10 cm with EPR.  Leak on the lower side with the 95th percentile at 5 L/min.   10/26/21: (He) reports 2 distinct episodes of dizziness in the past 6 months.  He felt lightheaded and felt like he could fall or pass out.  He denies any spinning sensation or nausea.  He did not have a headache at the time but does have a history of migraines.  Years ago he had migraine headaches and some years ago he started having ocular migraines without major headaches. He has had recurrent headaches but these are different from his migraines.  He wakes up in the middle of the night with a headache in the back of his head.  It feels like a  tightness in the posterior head and neck muscles radiating to the shoulder areas.  He does not sleep well and snoring has worsened.  Of note, he tried an oral appliance for sleep apnea through Dr. Myrtis Ser but he developed TMJ issues and changes in his teeth alignment.  He could not tolerate the oral appliance.  His wife reports that his snoring and apneic pauses have become worse.     He takes propranolol 20 mg daily for his essential tremor and has been on the same dose for years.  His tremor has become worse over time as well.   His wife reports that he has had more difficulty with concentration and memory.  He is more irritable.   His blood pressure has been more elevated consistently and he was recently started by his primary care on losartan.  Weight has been fairly stable.  He has reduced his caffeine intake.  He tries to hydrate well with water.  Of note, he has not had an eye examination in over 3 years and he does feel that his prescription is changed.   He had a head CT without contrast on 07/14/2019 and I reviewed the results: IMPRESSION: No focal acute intracranial abnormality identified.   He had a recent brain MRI without contrast through [facility] on 10/13/2019 and I was  able to review the results in care everywhere: IMPRESSION: Normal MRI of the brain without contrast.    Of note, he was hospitalized with COVID-pneumonia in October 2020.  I reviewed the hospital records from his admission from 07/25/2019 through 07/27/2019 at [facility] [place] [facility].  He presented with shortness of breath and cough and exposure to COVID-19.  He had developed a severe headache.  He had initially presented to the emergency room on 07/14/2019 with the symptoms and had a head CT, he was not hypoxic at the time and was placed on outpatient steroid treatment.  He developed worsening cough and chest congestion and was noted to be hypoxic. He was treated for pneumonia with Levaquin, he was outside the window for remdesivir.   I had evaluated him over 4 years ago for sleep apnea concerns.  He had a baseline sleep study on 08/05/2017 which showed rather mild obstructive sleep apnea with an AHI of 6.1/h, O2 nadir was 87%.  He was advised to consider AutoPap therapy.  He indicated preference to go through with dentistry with an oral appliance.     06/26/17: 62 year old right-handed gentleman with an underlying medical history of allergies, diverticulitis with status post partial colectomy in 2015, ET, hyperlipidemia, reflux disease, mitral valve prolapse, history of kidney stones, history of ocular migraines, and overweight state, who was previously diagnosed several years ago while in [place], with mild to borderline obstructive sleep apnea for which he tried CPAP. He did not think it helped and could not sleep with it. He stopped it after a month. He reports recurrent headaches for the past few months. He has also noticed tooth grinding and has started using a mouth guard which has helped a little bit. He has a history of snoring and has woken up in the middle of the night with headaches. His Epworth sleepiness score is 4 out of 24, fatigue score is 22 out of 63. He is married and lives  with his wife and 60 year old daughter. He has 4 children, 49 yo son, twins (son and daughter) and 37 yo adoptive daughter. He is a nonsmoker and drinks alcohol occasionally, 4-6 times per  month, does not use illicit drugs and does not drink caffeine on a day-to-day basis. He had L spine surgery 2002. He does not have residual pain. He still has migraines, mostly visual auras. His triggers are sleep deprivation and dehydration, changing time zones. He travels about once a month for his work. He has a lot of overseas travels. He does not sleep well. He has multiple nighttime awakenings. Nocturia is about 2-3 times per average night. His snoring is reportedly loud at times and disturbing to his wife. He has restless leg symptoms. He also has bilateral shoulder pain, left more than right. He has been on clonazepam at night for sleep for some time, several years. He used to be on 0.25 mg at night, currently 0.5 mg at night. It also seems to help his restless leg symptoms. His oldest son had restless leg symptoms and also took clonazepam for some time for it.      His Past Medical History Is Significant For: Past Medical History:  Diagnosis Date   Allergy    Blood in stool    Diverticulitis    GERD (gastroesophageal reflux disease)    CONTROLLED WITH DIET - NO MEDS   History of kidney stones    Hyperlipidemia    Hypertension    Migraines    OCULAR MIGRAINES   Pneumonia    walking   Sleep apnea    STATES SLEEP STUDY 4 OR 5 YEARS AGO - TOLD BORDERLINE - TRIED CPAP FOR 30 DAYS - STATES IT DID NOT HELP - NO LONGER USES.    His Past Surgical History Is Significant For: Past Surgical History:  Procedure Laterality Date   APPENDECTOMY     COLON SURGERY     INGUINAL HERNIA REPAIR Right 12/28/2021   Procedure: LAPAROSCOPIC RIGHT INGUINAL HERNIA REPAIR WITH MESH;  Surgeon: Abigail Miyamoto, MD;  Location: [facility] OR;  Service: General;  Laterality: Right;   LAPAROSCOPIC PARTIAL COLECTOMY N/A 06/24/2014    Procedure: LAPAROSCOPIC ASSISTED  PARTIAL COLECTOMY;  Surgeon: Avel Peace, MD;  Location: [facility] ORS;  Service: General;  Laterality: N/A;   NASAL RECONSTRUCTION WITH SEPTAL REPAIR     SPINE SURGERY  2001   rupture L4-L5   VASECTOMY      His Family History Is Significant For: Family History  Problem Relation Age of Onset   Arthritis Mother    Hyperlipidemia Mother    Stroke Mother    Hypertension Mother    Heart disease Mother        valvular heart disease ? which valve.   Cancer Father 49       prostate   Hypertension Father    Cancer Maternal Grandmother        ovarian   Stroke Paternal Grandfather    Sleep apnea Neg Hx     His Social History Is Significant For: Social History   Socioeconomic History   Marital status: Married    Spouse name: Not on file   Number of children: Not on file   Years of education: Not on file   Highest education level: Not on file  Occupational History   Not on file  Tobacco Use   Smoking status: Never    Passive exposure: Never   Smokeless tobacco: Never  Vaping Use   Vaping Use: Never used  Substance and Sexual Activity   Alcohol use: Yes    Comment: RARE ALCOHOL   Drug use: No   Sexual activity: Yes    Partners:  Female  Other Topics Concern   Not on file  Social History Narrative   Caffeine: 1 cup/day   Social Determinants of Health   Financial Resource Strain: Not on file  Food Insecurity: Not on file  Transportation Needs: Not on file  Physical Activity: Not on file  Stress: Not on file  Social Connections: Not on file    His Allergies Are:  Allergies  Allergen Reactions   Flomax [Tamsulosin]     Dizziness, felt "funny"   Biaxin [Clarithromycin] Diarrhea  :   His Current Medications Are:  Outpatient Encounter Medications as of 03/21/2023  Medication Sig   albuterol (VENTOLIN HFA) 108 (90 Base) MCG/ACT inhaler Inhale 2 puffs into the lungs every 6 (six) hours as needed for wheezing or shortness of breath.    doxycycline (VIBRA-TABS) 100 MG tablet Take 1 tablet (100 mg total) by mouth 2 (two) times daily.   hydrochlorothiazide (HYDRODIURIL) 25 MG tablet Take 1 tablet (25 mg total) by mouth every morning.   HYDROcodone bit-homatropine (HYCODAN) 5-1.5 MG/5ML syrup Take 5 mLs by mouth every 8 (eight) hours as needed for cough.   KRILL OIL PO Take by mouth.   loratadine (CLARITIN) 10 MG tablet Take 10 mg by mouth at bedtime.   losartan (COZAAR) 50 MG tablet losartan 50 mg tablet  TAKE ONE TABLET BY MOUTH DAILY.   pantoprazole (PROTONIX) 20 MG tablet Take 1 tablet (20 mg total) by mouth every morning.   propranolol ER (INDERAL LA) 60 MG 24 hr capsule Take 1 capsule (60 mg total) by mouth daily.   rizatriptan (MAXALT-MLT) 5 MG disintegrating tablet Take 1 tablet (5 mg total) by mouth as needed for migraine. May repeat in 2 hours if needed   [DISCONTINUED] predniSONE (DELTASONE) 50 MG tablet Take 50mg  daily x5 days. (Patient not taking: Reported on 03/21/2023)   No facility-administered encounter medications on file as of 03/21/2023.  :  Review of Systems:  Out of a complete 14 point review of systems, all are reviewed and negative with the exception of these symptoms as listed below:  Review of Systems  Neurological:        Pt having issues with staying asleep (deep sleep).  Having issues with memory per wife.  ESS 10.    Objective:  Neurological Exam  Physical Exam Physical Examination:   Vitals:   03/21/23 1034  BP: 118/73  Pulse: (!) 55   General Examination: The patient is a very pleasant 62 y.o. male in no acute distress. He appears well-developed and well-nourished and well groomed.   HEENT: Normocephalic, atraumatic, pupils are reactive, corrective eyeglasses in place, no photophobia.  Tracking is well-preserved. Hearing is grossly intact. Face is symmetric with normal facial animation. Speech is clear with no dysarthria, hypophonia or voice tremor.  No significant head tremor noted.  There are no carotid bruits on auscultation. Oropharynx exam reveals: Stable findings, mild mouth dryness.     Chest: Clear to auscultation without wheezing, rhonchi or crackles noted.   Heart: S1+S2+0, regular and normal without murmurs, rubs or gallops noted.  Mildly bradycardic.   Abdomen: no obvious abnormality, no distention.     Extremities: There is no obvious edema.   Skin: Warm and dry without trophic changes noted.   Musculoskeletal: exam reveals no obvious joint deformities.    Neurologically:  Mental status: The patient is awake, alert and oriented, able to provide his history well.  No evidence of evidence of aphasia, agnosia, apraxia or anomia. Speech is  clear with normal prosody and enunciation. Thought process is linear. Mood is normal and affect is normal.  Cranial nerves II - XII are as described above under HEENT exam.  Motor exam: Normal bulk, moves all 4 extremities, no obvious action or resting tremor.  No intention tremor.  Fine motor skills and coordination: Grossly intact.  Cerebellar testing: No dysmetria or intention tremor. There is no truncal or gait ataxia.  Sensory exam: intact to light touch.  Gait, station and balance: He stands easily. No veering to one side is noted. No leaning to one side is noted. Posture is age-appropriate and stance is narrow based. Gait shows normal stride length and normal pace. No problems turning are noted.     Assessment and Plan:    In summary, Tim Gordon is a 62 year old right-handed gentleman with an underlying medical history of reflux disease, kidney stones, migraine headaches, ocular migraines, nonarteritic AION of the left eye, hyperlipidemia, allergies, essential tremor, diverticulitis, dizziness, sleep apnea and overweight state, who presents for follow-up consultation of his sleep apnea and sleep disturbance.  He is compliant with his AutoPap and commended for it.  He has trouble maintaining sleep.  We talked at length  today.  There are certainly different reasons for why he may not be sleeping well.  He is advised to consider melatonin again.  Stress may be a player, recent illness as well, as well as underlying tendency for anxiety.  He is advised to circle back to primary care to discuss possible management of anxiety with a low-dose medication, counseling should be considered as well in light of recent stress.  He is advised to try melatonin up to 10 mg at night.  He is advised to continue to be consistent with his AutoPap.  He has had some discomfort with the mask especially if the humidity level is too high, he is advised to down adjust the humidity level and he can certainly talk to his DME provider about alternative mask options.  He is advised to continue for his essential tremor propranolol long-acting 60 mg daily.  He has had cognitive complaints for months or even years.  We will proceed with a formal evaluation with the help of a neuropsychologist.  I made a referral and he is agreeable to pursuing this.  He had a brain MRI about a year ago without any obvious concerns.  He has used Maxalt as needed for his migraines.  He is advised to make an appointment with his urologist as he has sleep disruption also from nocturia and difficulty emptying his bladder.  This may also be part of his sleep disruption and difficulty maintaining sleep.  At this juncture, he is advised to follow-up routinely in this clinic in 1 year, sooner if needed.  This was an extended visit of over 40 minutes due to extended chart review and addressing multiple problems.  I answered all the questions today and the patient and his wife were in agreement.

## 2023-04-05 ENCOUNTER — Telehealth: Payer: Self-pay | Admitting: Family Medicine

## 2023-04-05 NOTE — Telephone Encounter (Signed)
Pt called.  Patient states he is still having a  rigorous cough since having walking pneumonia and he has completed Prednisone.  Please advise.

## 2023-04-18 NOTE — Telephone Encounter (Signed)
Pattricia Boss, can you check referral again? Looks like it was supposed to go to Dr Rueben Bash. Dr Kieth Brightly is booked until next year. I can place a new order if needed!

## 2023-04-22 NOTE — Addendum Note (Signed)
Addended by: Huston Foley on: 04/22/2023 12:13 PM   Modules accepted: Orders

## 2023-04-22 NOTE — Telephone Encounter (Signed)
New referral placed.

## 2023-05-31 ENCOUNTER — Ambulatory Visit: Payer: BC Managed Care – PPO | Admitting: Family Medicine

## 2023-06-06 ENCOUNTER — Ambulatory Visit (INDEPENDENT_AMBULATORY_CARE_PROVIDER_SITE_OTHER): Payer: BC Managed Care – PPO | Admitting: Family Medicine

## 2023-06-06 ENCOUNTER — Encounter: Payer: Self-pay | Admitting: Family Medicine

## 2023-06-06 ENCOUNTER — Other Ambulatory Visit: Payer: Self-pay | Admitting: Family Medicine

## 2023-06-06 VITALS — BP 137/88 | HR 55 | Ht 69.0 in | Wt 197.0 lb

## 2023-06-06 DIAGNOSIS — M7989 Other specified soft tissue disorders: Secondary | ICD-10-CM | POA: Diagnosis not present

## 2023-06-06 DIAGNOSIS — E782 Mixed hyperlipidemia: Secondary | ICD-10-CM

## 2023-06-06 DIAGNOSIS — G43109 Migraine with aura, not intractable, without status migrainosus: Secondary | ICD-10-CM

## 2023-06-06 DIAGNOSIS — I10 Essential (primary) hypertension: Secondary | ICD-10-CM

## 2023-06-06 NOTE — Assessment & Plan Note (Signed)
TG elevated previously.  Dietary changes encouraged. Update lipids.

## 2023-06-06 NOTE — Assessment & Plan Note (Signed)
Continue propranolol at current strength.  Maxalt as needed.

## 2023-06-06 NOTE — Progress Notes (Signed)
Tim Gordon - 62 y.o. male MRN [id number]  Date of birth: [birth year]  Subjective Chief Complaint  Patient presents with   Hypertension    HPI Tim Gordon is a 62 y.o. male here today for follow up visit.   He continues on losartan, hydrochlorothiazide, and propranolol for management of HTN.  He is tolerating medications well at current strength.   His BP is mildly elevated on initial check this morning.  He feels like he has some swelling in his hands at night and first thing in the morning.  Denies chest pain, shortness of breath, palpitations, headache or vision changes.   Propranolol also used for migraine prevention.  Uses Maxalt as needed for Migraines.  This seems to be working for him.   ROS:  A comprehensive ROS was completed and negative except as noted per HPI  Allergies  Allergen Reactions   Flomax [Tamsulosin]     Dizziness, felt "funny"   Biaxin [Clarithromycin] Diarrhea    Past Medical History:  Diagnosis Date   Allergy    Blood in stool    Diverticulitis    GERD (gastroesophageal reflux disease)    CONTROLLED WITH DIET - NO MEDS   History of kidney stones    Hyperlipidemia    Hypertension    Migraines    OCULAR MIGRAINES   Pneumonia    walking   Sleep apnea    STATES SLEEP STUDY 4 OR 5 YEARS AGO - TOLD BORDERLINE - TRIED CPAP FOR 30 DAYS - STATES IT DID NOT HELP - NO LONGER USES.    Past Surgical History:  Procedure Laterality Date   APPENDECTOMY     COLON SURGERY     INGUINAL HERNIA REPAIR Right 12/28/2021   Procedure: LAPAROSCOPIC RIGHT INGUINAL HERNIA REPAIR WITH MESH;  Surgeon: Abigail Miyamoto, MD;  Location: [facility] OR;  Service: General;  Laterality: Right;   LAPAROSCOPIC PARTIAL COLECTOMY N/A 06/24/2014   Procedure: LAPAROSCOPIC ASSISTED  PARTIAL COLECTOMY;  Surgeon: Avel Peace, MD;  Location: [facility] ORS;  Service: General;  Laterality: N/A;   NASAL RECONSTRUCTION WITH SEPTAL REPAIR     SPINE SURGERY  2001   rupture L4-L5   VASECTOMY       Social History   Socioeconomic History   Marital status: Married    Spouse name: Not on file   Number of children: Not on file   Years of education: Not on file   Highest education level: Not on file  Occupational History   Not on file  Tobacco Use   Smoking status: Never    Passive exposure: Never   Smokeless tobacco: Never  Vaping Use   Vaping status: Never Used  Substance and Sexual Activity   Alcohol use: Yes    Comment: RARE ALCOHOL   Drug use: No   Sexual activity: Yes    Partners: Female  Other Topics Concern   Not on file  Social History Narrative   Caffeine: 1 cup/day   Social Determinants of Health   Financial Resource Strain: Not on file  Food Insecurity: No Food Insecurity (07/10/2022)   Received from [facility], [facility]   Hunger Vital Sign    Worried About Running Out of Food in the Last Year: Never true    Ran Out of Food in the Last Year: Never true  Transportation Needs: Not on file  Physical Activity: Not on file  Stress: Not on file  Social Connections: Unknown (02/08/2022)   Received from [facility], [facility]   Social Network    Social Network: Not on file    Family History  Problem Relation Age of Onset   Arthritis Mother    Hyperlipidemia Mother    Stroke Mother    Hypertension Mother    Heart disease Mother        valvular heart disease ? which valve.   Cancer Father 98       prostate   Hypertension Father    Cancer Maternal Grandmother        ovarian   Stroke Paternal Grandfather    Sleep apnea Neg Hx     Health Maintenance  Topic Date Due   Colonoscopy  09/26/2023   COVID-19 Vaccine (5 - 2023-24 season) 12/16/2023 (Originally 06/08/2022)   INFLUENZA VACCINE  01/06/2024 (Originally 05/09/2023)   DTaP/Tdap/Td (2 - Td or Tdap) 07/28/2031   Hepatitis C Screening  Completed   HIV Screening  Completed   Zoster Vaccines- Shingrix  Completed   HPV VACCINES  Aged Out      ----------------------------------------------------------------------------------------------------------------------------------------------------------------------------------------------------------------- Physical Exam BP 137/88   Pulse (!) 55   Ht 5\' 9"  (1.753 m)   Wt 197 lb (89.4 kg)   SpO2 98%   BMI 29.09 kg/m   Physical Exam Constitutional:      Appearance: Normal appearance.  HENT:     Head: Normocephalic and atraumatic.  Eyes:     General: No scleral icterus. Cardiovascular:     Rate and Rhythm: Normal rate and regular rhythm.  Pulmonary:     Effort: Pulmonary effort is normal.     Breath sounds: Normal breath sounds.  Neurological:     Mental Status: He is alert.  Psychiatric:        Mood and Affect: Mood normal.        Behavior: Behavior normal.     ------------------------------------------------------------------------------------------------------------------------------------------------------------------------------------------------------------------- Assessment and Plan  Ocular migraine Continue propranolol at current strength.  Maxalt as needed.   Hyperlipidemia TG elevated previously.  Dietary changes encouraged. Update lipids.   Essential hypertension Doing well with current medications.  BP is fairly well controlled.  Will plan to continue current medications.   Hand swelling Checking renal function and TSH.    No orders of the defined types were placed in this encounter.   No follow-ups on file.    This visit occurred during the SARS-CoV-2 public health emergency.  Safety protocols were in place, including screening questions prior to the visit, additional usage of staff PPE, and extensive cleaning of exam room while observing appropriate contact time as indicated for disinfecting solutions.

## 2023-06-06 NOTE — Assessment & Plan Note (Signed)
Doing well with current medications.  BP is fairly well controlled.  Will plan to continue current medications.

## 2023-06-06 NOTE — Assessment & Plan Note (Signed)
Checking renal function and TSH.

## 2023-06-07 LAB — CMP14+EGFR
ALT: 28 IU/L (ref 0–44)
AST: 25 IU/L (ref 0–40)
Albumin: 4.7 g/dL (ref 3.9–4.9)
Alkaline Phosphatase: 69 IU/L (ref 44–121)
BUN/Creatinine Ratio: 15 (ref 10–24)
BUN: 18 mg/dL (ref 8–27)
Bilirubin Total: 1.6 mg/dL — ABNORMAL HIGH (ref 0.0–1.2)
CO2: 27 mmol/L (ref 20–29)
Calcium: 9.4 mg/dL (ref 8.6–10.2)
Chloride: 101 mmol/L (ref 96–106)
Creatinine, Ser: 1.2 mg/dL (ref 0.76–1.27)
Globulin, Total: 2.3 g/dL (ref 1.5–4.5)
Glucose: 98 mg/dL (ref 70–99)
Potassium: 3.9 mmol/L (ref 3.5–5.2)
Sodium: 140 mmol/L (ref 134–144)
Total Protein: 7 g/dL (ref 6.0–8.5)
eGFR: 69 mL/min/{1.73_m2} (ref >59)

## 2023-06-07 LAB — TSH: TSH: 2.87 u[IU]/mL (ref 0.450–4.500)

## 2023-06-07 LAB — CBC WITH DIFFERENTIAL/PLATELET
Basophils Absolute: 0 10*3/uL (ref 0.0–0.2)
Basos: 1 %
EOS (ABSOLUTE): 0.1 10*3/uL (ref 0.0–0.4)
Eos: 2 %
Hematocrit: 47.7 % (ref 37.5–51.0)
Hemoglobin: 16 g/dL (ref 13.0–17.7)
Immature Grans (Abs): 0 10*3/uL (ref 0.0–0.1)
Immature Granulocytes: 0 %
Lymphocytes Absolute: 1.1 10*3/uL (ref 0.7–3.1)
Lymphs: 32 %
MCH: 31.6 pg (ref 26.6–33.0)
MCHC: 33.5 g/dL (ref 31.5–35.7)
MCV: 94 fL (ref 79–97)
Monocytes Absolute: 0.4 10*3/uL (ref 0.1–0.9)
Monocytes: 11 %
Neutrophils Absolute: 1.9 10*3/uL (ref 1.4–7.0)
Neutrophils: 54 %
Platelets: 167 10*3/uL (ref 150–450)
RBC: 5.06 x10E6/uL (ref 4.14–5.80)
RDW: 13 % (ref 11.6–15.4)
WBC: 3.3 10*3/uL — ABNORMAL LOW (ref 3.4–10.8)

## 2023-06-07 LAB — LIPID PANEL WITH LDL/HDL RATIO
Cholesterol, Total: 159 mg/dL (ref 100–199)
HDL: 33 mg/dL — ABNORMAL LOW (ref >39)
LDL Chol Calc (NIH): 80 mg/dL (ref 0–99)
LDL/HDL Ratio: 2.4 ratio (ref 0.0–3.6)
Triglycerides: 283 mg/dL — ABNORMAL HIGH (ref 0–149)
VLDL Cholesterol Cal: 46 mg/dL — ABNORMAL HIGH (ref 5–40)

## 2023-06-25 ENCOUNTER — Encounter: Payer: Self-pay | Admitting: Family Medicine

## 2023-06-25 ENCOUNTER — Ambulatory Visit (INDEPENDENT_AMBULATORY_CARE_PROVIDER_SITE_OTHER): Payer: BC Managed Care – PPO | Admitting: Family Medicine

## 2023-06-25 VITALS — BP 134/76 | HR 61 | Temp 98.2°F | Ht 69.0 in | Wt 198.0 lb

## 2023-06-25 DIAGNOSIS — Z20822 Contact with and (suspected) exposure to covid-19: Secondary | ICD-10-CM

## 2023-06-25 DIAGNOSIS — J40 Bronchitis, not specified as acute or chronic: Secondary | ICD-10-CM | POA: Diagnosis not present

## 2023-06-25 DIAGNOSIS — J329 Chronic sinusitis, unspecified: Secondary | ICD-10-CM | POA: Diagnosis not present

## 2023-06-25 DIAGNOSIS — J069 Acute upper respiratory infection, unspecified: Secondary | ICD-10-CM

## 2023-06-25 LAB — POCT INFLUENZA A/B
Influenza A, POC: NEGATIVE
Influenza B, POC: NEGATIVE

## 2023-06-25 LAB — POC COVID19 BINAXNOW: SARS Coronavirus 2 Ag: NEGATIVE

## 2023-06-25 MED ORDER — HYDROCODONE BIT-HOMATROP MBR 5-1.5 MG/5ML PO SOLN: 5.0000 mL | Freq: Three times a day (TID) | ORAL | 0 refills | Status: DC | PRN

## 2023-06-25 MED ORDER — PREDNISONE 50 MG PO TABS: ORAL_TABLET | ORAL | 0 refills | Status: DC

## 2023-06-25 MED ORDER — DOXYCYCLINE HYCLATE 100 MG PO TABS: 100.0000 mg | ORAL_TABLET | Freq: Two times a day (BID) | ORAL | 0 refills | Status: DC

## 2023-06-25 NOTE — Progress Notes (Signed)
Tim Gordon - 62 y.o. male MRN [id number]  Date of birth: [birth year]  Subjective Chief Complaint  Patient presents with   Nasal Congestion   URI   Cough    HPI Tim Gordon is a 62 y.o. male here today with complaint of nasal congestio, cough, and chest congestion.  Symptoms started a few days ago.  He has been using left over hycodan cough syrup which helps.  Denies dyspnea, fever, chills, body aches or GI symptoms.    ROS:  A comprehensive ROS was completed and negative except as noted per HPI  Allergies  Allergen Reactions   Flomax [Tamsulosin]     Dizziness, felt "funny"   Biaxin [Clarithromycin] Diarrhea    Past Medical History:  Diagnosis Date   Allergy    Blood in stool    Diverticulitis    GERD (gastroesophageal reflux disease)    CONTROLLED WITH DIET - NO MEDS   History of kidney stones    Hyperlipidemia    Hypertension    Migraines    OCULAR MIGRAINES   Pneumonia    walking   Sleep apnea    STATES SLEEP STUDY 4 OR 5 YEARS AGO - TOLD BORDERLINE - TRIED CPAP FOR 30 DAYS - STATES IT DID NOT HELP - NO LONGER USES.    Past Surgical History:  Procedure Laterality Date   APPENDECTOMY     COLON SURGERY     INGUINAL HERNIA REPAIR Right 12/28/2021   Procedure: LAPAROSCOPIC RIGHT INGUINAL HERNIA REPAIR WITH MESH;  Surgeon: Abigail Miyamoto, MD;  Location: [facility] OR;  Service: General;  Laterality: Right;   LAPAROSCOPIC PARTIAL COLECTOMY N/A 06/24/2014   Procedure: LAPAROSCOPIC ASSISTED  PARTIAL COLECTOMY;  Surgeon: Avel Peace, MD;  Location: [facility] ORS;  Service: General;  Laterality: N/A;   NASAL RECONSTRUCTION WITH SEPTAL REPAIR     SPINE SURGERY  2001   rupture L4-L5   VASECTOMY      Social History   Socioeconomic History   Marital status: Married    Spouse name: Not on file   Number of children: Not on file   Years of education: Not on file   Highest education level: Not on file  Occupational History   Not on file  Tobacco Use   Smoking status: Never     Passive exposure: Never   Smokeless tobacco: Never  Vaping Use   Vaping status: Never Used  Substance and Sexual Activity   Alcohol use: Yes    Comment: RARE ALCOHOL   Drug use: No   Sexual activity: Yes    Partners: Female  Other Topics Concern   Not on file  Social History Narrative   Caffeine: 1 cup/day   Social Determinants of Health   Financial Resource Strain: Not on file  Food Insecurity: No Food Insecurity (07/10/2022)   Received from [facility]   Hunger Vital Sign    Worried About Running Out of Food in the Last Year: Never true    Ran Out of Food in the Last Year: Never true  Transportation Needs: Not on file  Physical Activity: Not on file  Stress: Not on file  Social Connections: Unknown (02/08/2022)   Received from [facility]   Social Network    Social Network: Not on file    Family History  Problem Relation Age of Onset   Arthritis Mother    Hyperlipidemia Mother    Stroke Mother    Hypertension Mother  Heart disease Mother        valvular heart disease ? which valve.   Cancer Father 42       prostate   Hypertension Father    Cancer Maternal Grandmother        ovarian   Stroke Paternal Grandfather    Sleep apnea Neg Hx     Health Maintenance  Topic Date Due   Colonoscopy  09/26/2023   COVID-19 Vaccine (5 - 2023-24 season) 08/11/2023 (Originally 06/09/2023)   INFLUENZA VACCINE  01/06/2024 (Originally 05/09/2023)   DTaP/Tdap/Td (2 - Td or Tdap) 07/28/2031   Hepatitis C Screening  Completed   HIV Screening  Completed   Zoster Vaccines- Shingrix  Completed   HPV VACCINES  Aged Out     ----------------------------------------------------------------------------------------------------------------------------------------------------------------------------------------------------------------- Physical Exam BP 134/76 (BP Location: Left Arm, Patient Position: Sitting, Cuff Size: Large)   Pulse 61   Temp 98.2 F  (36.8 C) (Oral)   Ht 5\' 9"  (1.753 m)   Wt 198 lb (89.8 kg)   SpO2 98%   BMI 29.24 kg/m   Physical Exam Constitutional:      Appearance: Normal appearance.  HENT:     Head: Normocephalic and atraumatic.  Cardiovascular:     Rate and Rhythm: Normal rate and regular rhythm.  Pulmonary:     Effort: Pulmonary effort is normal.     Breath sounds: Normal breath sounds.  Musculoskeletal:     Cervical back: Neck supple.  Neurological:     General: No focal deficit present.     Mental Status: He is alert.  Psychiatric:        Mood and Affect: Mood normal.        Behavior: Behavior normal.     ------------------------------------------------------------------------------------------------------------------------------------------------------------------------------------------------------------------- Assessment and Plan  Sinobronchitis COVID and flu vaccine Recommend continued supportive care.  Adding course of doxycycline and prednisone.  Hydromet cough syrup renewed.    Meds ordered this encounter  Medications   predniSONE (DELTASONE) 50 MG tablet    Sig: Take 50mg  po daily    Dispense:  5 tablet    Refill:  0   doxycycline (VIBRA-TABS) 100 MG tablet    Sig: Take 1 tablet (100 mg total) by mouth 2 (two) times daily.    Dispense:  20 tablet    Refill:  0   HYDROcodone bit-homatropine (HYCODAN) 5-1.5 MG/5ML syrup    Sig: Take 5 mLs by mouth every 8 (eight) hours as needed for cough.    Dispense:  120 mL    Refill:  0    No follow-ups on file.    This visit occurred during the SARS-CoV-2 public health emergency.  Safety protocols were in place, including screening questions prior to the visit, additional usage of staff PPE, and extensive cleaning of exam room while observing appropriate contact time as indicated for disinfecting solutions.

## 2023-06-25 NOTE — Assessment & Plan Note (Signed)
COVID and flu vaccine Recommend continued supportive care.  Adding course of doxycycline and prednisone.  Hydromet cough syrup renewed.

## 2023-07-17 DIAGNOSIS — N400 Enlarged prostate without lower urinary tract symptoms: Secondary | ICD-10-CM | POA: Diagnosis not present

## 2023-07-23 DIAGNOSIS — R972 Elevated prostate specific antigen [PSA]: Secondary | ICD-10-CM | POA: Diagnosis not present

## 2023-07-23 DIAGNOSIS — N489 Disorder of penis, unspecified: Secondary | ICD-10-CM | POA: Diagnosis not present

## 2023-07-23 DIAGNOSIS — N400 Enlarged prostate without lower urinary tract symptoms: Secondary | ICD-10-CM | POA: Diagnosis not present

## 2023-07-23 DIAGNOSIS — Z8042 Family history of malignant neoplasm of prostate: Secondary | ICD-10-CM | POA: Diagnosis not present

## 2023-07-23 DIAGNOSIS — Z1331 Encounter for screening for depression: Secondary | ICD-10-CM | POA: Diagnosis not present

## 2023-08-11 ENCOUNTER — Encounter (INDEPENDENT_AMBULATORY_CARE_PROVIDER_SITE_OTHER): Payer: BC Managed Care – PPO | Admitting: Family Medicine

## 2023-08-11 DIAGNOSIS — J0100 Acute maxillary sinusitis, unspecified: Secondary | ICD-10-CM

## 2023-08-12 DIAGNOSIS — K4020 Bilateral inguinal hernia, without obstruction or gangrene, not specified as recurrent: Secondary | ICD-10-CM | POA: Diagnosis not present

## 2023-08-12 DIAGNOSIS — N402 Nodular prostate without lower urinary tract symptoms: Secondary | ICD-10-CM | POA: Diagnosis not present

## 2023-08-12 DIAGNOSIS — R972 Elevated prostate specific antigen [PSA]: Secondary | ICD-10-CM | POA: Diagnosis not present

## 2023-08-12 MED ORDER — DOXYCYCLINE HYCLATE 100 MG PO TABS: 100.0000 mg | ORAL_TABLET | Freq: Two times a day (BID) | ORAL | 0 refills | Status: DC

## 2023-08-12 NOTE — Telephone Encounter (Signed)
Please see the MyChart message reply(ies) for my assessment and plan.    This patient gave consent for this Medical Advice Message and is aware that it may result in a bill to their insurance company, as well as the possibility of receiving a bill for a co-payment or deductible. They are an established patient, but are not seeking medical advice exclusively about a problem treated during an in person or video visit in the last seven days. I did not recommend an in person or video visit within seven days of my reply.    I spent a total of 6 minutes cumulative time within 7 days through MyChart messaging.  Chenay Nesmith, DO   

## 2023-09-10 DIAGNOSIS — G4733 Obstructive sleep apnea (adult) (pediatric): Secondary | ICD-10-CM | POA: Diagnosis not present

## 2023-11-12 DIAGNOSIS — K5730 Diverticulosis of large intestine without perforation or abscess without bleeding: Secondary | ICD-10-CM | POA: Diagnosis not present

## 2023-11-12 DIAGNOSIS — Z1211 Encounter for screening for malignant neoplasm of colon: Secondary | ICD-10-CM | POA: Diagnosis not present

## 2023-11-12 DIAGNOSIS — K625 Hemorrhage of anus and rectum: Secondary | ICD-10-CM | POA: Diagnosis not present

## 2023-11-13 ENCOUNTER — Other Ambulatory Visit: Payer: Self-pay | Admitting: Gastroenterology

## 2023-12-06 ENCOUNTER — Encounter: Payer: BC Managed Care – PPO | Admitting: Family Medicine

## 2023-12-07 ENCOUNTER — Other Ambulatory Visit: Payer: Self-pay | Admitting: Family Medicine

## 2023-12-19 NOTE — Anesthesia Preprocedure Evaluation (Signed)
 Anesthesia Evaluation  Patient identified by MRN, date of birth, ID band Patient awake    Reviewed: Allergy & Precautions, NPO status , Patient's Chart, lab work & pertinent test results, reviewed documented beta blocker date and time   Airway Mallampati: III  TM Distance: >3 FB Neck ROM: Full    Dental  (+) Teeth Intact, Dental Advisory Given   Pulmonary sleep apnea and Continuous Positive Airway Pressure Ventilation    Pulmonary exam normal breath sounds clear to auscultation       Cardiovascular hypertension, Pt. on medications and Pt. on home beta blockers Normal cardiovascular exam Rhythm:Regular Rate:Normal     Neuro/Psych  Headaches    GI/Hepatic Neg liver ROS,GERD  Medicated,,  Endo/Other  negative endocrine ROS    Renal/GU negative Renal ROS     Musculoskeletal  (+) Arthritis ,    Abdominal   Peds  Hematology negative hematology ROS (+)   Anesthesia Other Findings   Reproductive/Obstetrics                             Anesthesia Physical Anesthesia Plan  ASA: 3  Anesthesia Plan: MAC   Post-op Pain Management: Minimal or no pain anticipated   Induction: Intravenous  PONV Risk Score and Plan: 1 and TIVA and Treatment may vary due to age or medical condition  Airway Management Planned: Natural Airway and Simple Face Mask  Additional Equipment:   Intra-op Plan:   Post-operative Plan:   Informed Consent: I have reviewed the patients History and Physical, chart, labs and discussed the procedure including the risks, benefits and alternatives for the proposed anesthesia with the patient or authorized representative who has indicated his/her understanding and acceptance.     Dental advisory given  Plan Discussed with: CRNA  Anesthesia Plan Comments:        Anesthesia Quick Evaluation

## 2023-12-20 ENCOUNTER — Ambulatory Visit (HOSPITAL_COMMUNITY): Admitting: Anesthesiology

## 2023-12-20 ENCOUNTER — Other Ambulatory Visit: Payer: Self-pay

## 2023-12-20 ENCOUNTER — Encounter (HOSPITAL_COMMUNITY): Payer: Self-pay | Admitting: Gastroenterology

## 2023-12-20 ENCOUNTER — Ambulatory Visit (HOSPITAL_COMMUNITY)
Admission: RE | Admit: 2023-12-20 | Discharge: 2023-12-20 | Disposition: A | Discharge status: Home / Self Care | Payer: BC Managed Care – PPO | Attending: Gastroenterology | Admitting: Gastroenterology

## 2023-12-20 ENCOUNTER — Encounter (HOSPITAL_COMMUNITY)
Admission: RE | Disposition: A | Discharge status: Home / Self Care | Payer: Self-pay | Source: Home / Self Care | Attending: Gastroenterology

## 2023-12-20 DIAGNOSIS — G4730 Sleep apnea, unspecified: Secondary | ICD-10-CM | POA: Insufficient documentation

## 2023-12-20 DIAGNOSIS — D120 Benign neoplasm of cecum: Secondary | ICD-10-CM | POA: Insufficient documentation

## 2023-12-20 DIAGNOSIS — Z139 Encounter for screening, unspecified: Secondary | ICD-10-CM | POA: Diagnosis not present

## 2023-12-20 DIAGNOSIS — K5730 Diverticulosis of large intestine without perforation or abscess without bleeding: Secondary | ICD-10-CM | POA: Insufficient documentation

## 2023-12-20 DIAGNOSIS — K5790 Diverticulosis of intestine, part unspecified, without perforation or abscess without bleeding: Secondary | ICD-10-CM | POA: Diagnosis not present

## 2023-12-20 DIAGNOSIS — K219 Gastro-esophageal reflux disease without esophagitis: Secondary | ICD-10-CM | POA: Diagnosis not present

## 2023-12-20 DIAGNOSIS — I10 Essential (primary) hypertension: Secondary | ICD-10-CM | POA: Insufficient documentation

## 2023-12-20 DIAGNOSIS — Z79899 Other long term (current) drug therapy: Secondary | ICD-10-CM | POA: Insufficient documentation

## 2023-12-20 DIAGNOSIS — Z1211 Encounter for screening for malignant neoplasm of colon: Secondary | ICD-10-CM | POA: Insufficient documentation

## 2023-12-20 DIAGNOSIS — K635 Polyp of colon: Secondary | ICD-10-CM | POA: Diagnosis not present

## 2023-12-20 SURGERY — COLONOSCOPY WITH PROPOFOL
Anesthesia: Monitor Anesthesia Care

## 2023-12-20 MED ORDER — PROPOFOL 500 MG/50ML IV EMUL
INTRAVENOUS | Status: AC
  Filled 2023-12-20: qty 50

## 2023-12-20 MED ORDER — PROPOFOL 500 MG/50ML IV EMUL
INTRAVENOUS | Status: DC | PRN
  Administered 2023-12-20: 130 ug/kg/min via INTRAVENOUS

## 2023-12-20 MED ORDER — PROPOFOL 10 MG/ML IV BOLUS
INTRAVENOUS | Status: DC | PRN
  Administered 2023-12-20: 30 mg via INTRAVENOUS
  Administered 2023-12-20: 20 mg via INTRAVENOUS

## 2023-12-20 MED ORDER — PROPOFOL 1000 MG/100ML IV EMUL
INTRAVENOUS | Status: AC
  Filled 2023-12-20: qty 100

## 2023-12-20 MED ORDER — SODIUM CHLORIDE 0.9 % IV SOLN: INTRAVENOUS | Status: DC | PRN

## 2023-12-20 SURGICAL SUPPLY — 20 items

## 2023-12-20 NOTE — Op Note (Signed)
 [facility] Patient Name: Tim Gordon Procedure Date: 12/20/2023 MRN: [id number] Attending MD: Jeani Hawking , MD, [id number] Date of Birth: July 25, 1961 CSN: [id number] Age: 63 Admit Type: Outpatient Procedure:                Colonoscopy Indications:              Screening for colorectal malignant neoplasm Providers:                Jeani Hawking, MD, Rogue Jury, RN, Kandice Robinsons, Technician Referring MD:             Jeani Hawking, MD Medicines:                Propofol per Anesthesia Complications:            No immediate complications. Estimated Blood Loss:     Estimated blood loss: none. Procedure:                Pre-Anesthesia Assessment:                           - Prior to the procedure, a History and Physical                            was performed, and patient medications and                            allergies were reviewed. The patient's tolerance of                            previous anesthesia was also reviewed. The risks                            and benefits of the procedure and the sedation                            options and risks were discussed with the patient.                            All questions were answered, and informed consent                            was obtained. Prior Anticoagulants: The patient has                            taken no anticoagulant or antiplatelet agents. ASA                            Grade Assessment: II - A patient with mild systemic                            disease. After reviewing the risks and benefits,  the patient was deemed in satisfactory condition to                            undergo the procedure.                           - Sedation was administered by an anesthesia                            professional. Deep sedation was attained.                           After obtaining informed consent, the colonoscope                            was passed  under direct vision. Throughout the                            procedure, the patient's blood pressure, pulse, and                            oxygen saturations were monitored continuously. The                            CF-HQ190L ([id number]) Olympus colonoscope was                            introduced through the anus and advanced to the the                            cecum, identified by appendiceal orifice and                            ileocecal valve. The colonoscopy was performed                            without difficulty. The patient tolerated the                            procedure well. The quality of the bowel                            preparation was evaluated using the BBPS [facility]                            Bowel Preparation Scale) with scores of: Right                            Colon = 3, Transverse Colon = 3 and Left Colon = 3                            (entire mucosa seen well with no residual staining,  small fragments of stool or opaque liquid). The                            total BBPS score equals 9. The ileocecal valve,                            appendiceal orifice, and rectum were photographed. Scope In: 7:37:31 AM Scope Out: 7:55:44 AM Scope Withdrawal Time: 0 hours 14 minutes 46 seconds  Total Procedure Duration: 0 hours 18 minutes 13 seconds  Findings:      A 3 mm polyp was found in the cecum. The polyp was sessile. The polyp       was removed with a cold snare. Resection and retrieval were complete.      Multiple large-mouthed, medium-mouthed and small-mouthed diverticula       were found in the sigmoid colon and descending colon. Impression:               - One 3 mm polyp in the cecum, removed with a cold                            snare. Resected and retrieved.                           - Diverticulosis in the sigmoid colon and in the                            descending colon. Moderate Sedation:      Not Applicable - Patient  had care per Anesthesia. Recommendation:           - Patient has a contact number available for                            emergencies. The signs and symptoms of potential                            delayed complications were discussed with the                            patient. Return to normal activities tomorrow.                            Written discharge instructions were provided to the                            patient.                           - Resume previous diet.                           - Continue present medications.                           - Await pathology results.                           -  Repeat colonoscopy in 7-10 years for surveillance. Procedure Code(s):        --- Professional ---                           [phone], Colonoscopy, flexible; with removal of                            tumor(s), polyp(s), or other lesion(s) by snare                            technique Diagnosis Code(s):        --- Professional ---                           Z12.11, Encounter for screening for malignant                            neoplasm of colon                           D12.0, Benign neoplasm of cecum                           K57.30, Diverticulosis of large intestine without                            perforation or abscess without bleeding CPT copyright 2022 American Medical Association. All rights reserved. The codes documented in this report are preliminary and upon coder review may  be revised to meet current compliance requirements. Jeani Hawking, MD Jeani Hawking, MD 12/20/2023 8:09:35 AM This report has been signed electronically. Number of Addenda: 0

## 2023-12-20 NOTE — Transfer of Care (Signed)
 Immediate Anesthesia Transfer of Care Note  Patient: Tim Gordon  Procedure(s) Performed: COLONOSCOPY WITH PROPOFOL POLYPECTOMY  Patient Location: PACU  Anesthesia Type:MAC  Level of Consciousness: sedated  Airway & Oxygen Therapy: Patient Spontanous Breathing and Patient connected to face mask oxygen  Post-op Assessment: Report given to RN and Post -op Vital signs reviewed and stable  Post vital signs: Reviewed and stable  Last Vitals:  Vitals Value Taken Time  BP    Temp    Pulse    Resp    SpO2      Last Pain:  Vitals:   12/20/23 0703  TempSrc: Temporal  PainSc: 0-No pain         Complications: No notable events documented.

## 2023-12-20 NOTE — H&P (Signed)
 Tim Gordon HPI: This 63 year old white male presents to the office for colorectal cancer screening. He has 1-2 BM's per day. He has occasional bright red blood on the toilet tissue and in the stool. He has good appetite and his weight is stable. He denies any complaints of abdominal pain, nausea, vomiting, acid reflux, dysphagia or odynophagia. He denies having a family history of colon cancer, celiac sprue or IBD. His last colonoscopy was done on 09/25/2013 which revealed diverticulosis in the sigmoid colon.  Past Medical History:  Diagnosis Date   Allergy    Blood in stool    Diverticulitis    GERD (gastroesophageal reflux disease)    CONTROLLED WITH DIET - NO MEDS   History of kidney stones    Hyperlipidemia    Hypertension    Migraines    OCULAR MIGRAINES   Pneumonia    walking   Sleep apnea    STATES SLEEP STUDY 4 OR 5 YEARS AGO - TOLD BORDERLINE - TRIED CPAP FOR 30 DAYS - STATES IT DID NOT HELP - NO LONGER USES.    Past Surgical History:  Procedure Laterality Date   APPENDECTOMY     COLON SURGERY     INGUINAL HERNIA REPAIR Right 12/28/2021   Procedure: LAPAROSCOPIC RIGHT INGUINAL HERNIA REPAIR WITH MESH;  Surgeon: Abigail Miyamoto, MD;  Location: [facility] OR;  Service: General;  Laterality: Right;   LAPAROSCOPIC PARTIAL COLECTOMY N/A 06/24/2014   Procedure: LAPAROSCOPIC ASSISTED  PARTIAL COLECTOMY;  Surgeon: Avel Peace, MD;  Location: [facility] ORS;  Service: General;  Laterality: N/A;   NASAL RECONSTRUCTION WITH SEPTAL REPAIR     SPINE SURGERY  2001   rupture L4-L5   VASECTOMY      Family History  Problem Relation Age of Onset   Arthritis Mother    Hyperlipidemia Mother    Stroke Mother    Hypertension Mother    Heart disease Mother        valvular heart disease ? which valve.   Cancer Father 34       prostate   Hypertension Father    Cancer Maternal Grandmother        ovarian   Stroke Paternal Grandfather    Sleep apnea Neg Hx     Social History:  reports that he  has never smoked. He has never been exposed to tobacco smoke. He has never used smokeless tobacco. He reports current alcohol use. He reports that he does not use drugs.  Allergies:  Allergies  Allergen Reactions   Flomax [Tamsulosin]     Dizziness, felt "funny"   Biaxin [Clarithromycin] Diarrhea    Medications: Scheduled: Continuous:  No results found for this or any previous visit (from the past 24 hours).   No results found.  ROS:  As stated above in the HPI otherwise negative.  Blood pressure (!) 147/89, pulse 62, temperature (!) 97.4 F (36.3 C), temperature source Temporal, resp. rate 17, height 5\' 9"  (1.753 m), weight 89.8 kg, SpO2 98%.    PE: Gen: NAD, Alert and Oriented HEENT:  [place]/AT, EOMI Neck: Supple, no LAD Lungs: CTA Bilaterally CV: RRR without M/G/R ABD: Soft, NTND, +BS Ext: No C/C/E  Assessment/Plan: 1) Screening colonoscopy.  [name] 12/20/2023, 7:22 AM

## 2023-12-20 NOTE — Discharge Instructions (Signed)

## 2023-12-22 NOTE — Anesthesia Postprocedure Evaluation (Signed)
 Anesthesia Post Note  Patient: Tim Gordon  Procedure(s) Performed: COLONOSCOPY WITH PROPOFOL POLYPECTOMY     Patient location during evaluation: Endoscopy Anesthesia Type: MAC Level of consciousness: oriented, awake and alert and awake Pain management: pain level controlled Vital Signs Assessment: post-procedure vital signs reviewed and stable Respiratory status: spontaneous breathing, nonlabored ventilation, respiratory function stable and patient connected to nasal cannula oxygen Cardiovascular status: blood pressure returned to baseline and stable Postop Assessment: no headache, no backache and no apparent nausea or vomiting Anesthetic complications: no   No notable events documented.  Last Vitals:  Vitals:   12/20/23 0810 12/20/23 0820  BP: 126/78 126/81  Pulse: 61 (!) 55  Resp: 15 15  Temp:    SpO2: 97% 98%    Last Pain:  Vitals:   12/20/23 0820  TempSrc:   PainSc: 0-No pain                 Collene Schlichter

## 2023-12-23 LAB — SURGICAL PATHOLOGY

## 2023-12-24 ENCOUNTER — Encounter (HOSPITAL_COMMUNITY): Payer: Self-pay | Admitting: Gastroenterology

## 2023-12-30 ENCOUNTER — Ambulatory Visit (INDEPENDENT_AMBULATORY_CARE_PROVIDER_SITE_OTHER): Payer: BC Managed Care – PPO | Admitting: Family Medicine

## 2023-12-30 ENCOUNTER — Encounter: Payer: Self-pay | Admitting: Family Medicine

## 2023-12-30 VITALS — BP 125/80 | HR 56 | Ht 69.0 in | Wt 199.0 lb

## 2023-12-30 DIAGNOSIS — Z0000 Encounter for general adult medical examination without abnormal findings: Secondary | ICD-10-CM | POA: Insufficient documentation

## 2023-12-30 DIAGNOSIS — L570 Actinic keratosis: Secondary | ICD-10-CM | POA: Diagnosis not present

## 2023-12-30 DIAGNOSIS — I10 Essential (primary) hypertension: Secondary | ICD-10-CM

## 2023-12-30 DIAGNOSIS — M79662 Pain in left lower leg: Secondary | ICD-10-CM

## 2023-12-30 DIAGNOSIS — M79669 Pain in unspecified lower leg: Secondary | ICD-10-CM | POA: Insufficient documentation

## 2023-12-30 DIAGNOSIS — E538 Deficiency of other specified B group vitamins: Secondary | ICD-10-CM

## 2023-12-30 DIAGNOSIS — E782 Mixed hyperlipidemia: Secondary | ICD-10-CM

## 2023-12-30 DIAGNOSIS — R3916 Straining to void: Secondary | ICD-10-CM | POA: Diagnosis not present

## 2023-12-30 DIAGNOSIS — N401 Enlarged prostate with lower urinary tract symptoms: Secondary | ICD-10-CM

## 2023-12-30 NOTE — Assessment & Plan Note (Signed)
 Well adult Orders Placed This Encounter  Procedures   CMP14+EGFR   CBC with Differential/Platelet   Lipid Panel With LDL/HDL Ratio   B12   TSH   PSA  Screenings: per lab orders Immunizations:  UTD Anticipatory guidance/Risk factor reduction:  recommendations per AVS

## 2023-12-30 NOTE — Progress Notes (Signed)
 Tim Gordon - 63 y.o. male MRN [id number]  Date of birth: Nov 01, 1960  Subjective Chief Complaint  Patient presents with   Annual Exam    HPI Tim Gordon is a 63 y.o. male here today for annual exam.   He reports that he is doing pretty well.  Having some pain in the L calf.  This started  a few weeks ago.  Worse when playing raquetball.  He does have pain in the back of the knee at times as well.  He denies swelling of the calf but note some swelling behind the knee after activity at times.   He has a couple of spots on his scalp he would like checked out.   He is moderately active. He feels that diet is pretty good.   Non-smoker.  Rare EtOH.   Review of Systems  Constitutional:  Negative for chills, fever, malaise/fatigue and weight loss.  HENT:  Negative for congestion, ear pain and sore throat.   Eyes:  Negative for blurred vision, double vision and pain.  Respiratory:  Negative for cough and shortness of breath.   Cardiovascular:  Negative for chest pain and palpitations.  Gastrointestinal:  Negative for abdominal pain, blood in stool, constipation, heartburn and nausea.  Genitourinary:  Negative for dysuria and urgency.  Musculoskeletal:  Negative for joint pain and myalgias.  Neurological:  Negative for dizziness and headaches.  Endo/Heme/Allergies:  Does not bruise/bleed easily.  Psychiatric/Behavioral:  Negative for depression. The patient is not nervous/anxious and does not have insomnia.     Allergies  Allergen Reactions   Flomax [Tamsulosin]     Dizziness, felt "funny"   Biaxin [Clarithromycin] Diarrhea    Past Medical History:  Diagnosis Date   Allergy    Blood in stool    Diverticulitis    GERD (gastroesophageal reflux disease)    CONTROLLED WITH DIET - NO MEDS   History of kidney stones    Hyperlipidemia    Hypertension    Migraines    OCULAR MIGRAINES   Pneumonia    walking   Sleep apnea    STATES SLEEP STUDY 4 OR 5 YEARS AGO - TOLD BORDERLINE  - TRIED CPAP FOR 30 DAYS - STATES IT DID NOT HELP - NO LONGER USES.    Past Surgical History:  Procedure Laterality Date   APPENDECTOMY     COLON SURGERY     COLONOSCOPY WITH PROPOFOL N/A 12/20/2023   Procedure: COLONOSCOPY WITH PROPOFOL;  Surgeon: Jeani Hawking, MD;  Location: [facility];  Service: Gastroenterology;  Laterality: N/A;   INGUINAL HERNIA REPAIR Right 12/28/2021   Procedure: LAPAROSCOPIC RIGHT INGUINAL HERNIA REPAIR WITH MESH;  Surgeon: Abigail Miyamoto, MD;  Location: [facility] OR;  Service: General;  Laterality: Right;   LAPAROSCOPIC PARTIAL COLECTOMY N/A 06/24/2014   Procedure: LAPAROSCOPIC ASSISTED  PARTIAL COLECTOMY;  Surgeon: Avel Peace, MD;  Location: [facility]S;  Service: General;  Laterality: N/A;   NASAL RECONSTRUCTION WITH SEPTAL REPAIR     POLYPECTOMY  12/20/2023   Procedure: POLYPECTOMY;  Surgeon: Jeani Hawking, MD;  Location: [facility];  Service: Gastroenterology;;   SPINE SURGERY  2001   rupture L4-L5   VASECTOMY      Social History   Socioeconomic History   Marital status: Married    Spouse name: Not on file   Number of children: Not on file   Years of education: Not on file   Highest education level: Not on file  Occupational History   Not on file  Tobacco Use   Smoking status: Never    Passive exposure: Never   Smokeless tobacco: Never  Vaping Use   Vaping status: Never Used  Substance and Sexual Activity   Alcohol use: Yes    Comment: RARE ALCOHOL   Drug use: No   Sexual activity: Yes    Partners: Female  Other Topics Concern   Not on file  Social History Narrative   Caffeine: 1 cup/day   Social Drivers of Corporate investment banker Strain: Low Risk  (07/21/2023)   Received from [facility]   Overall Financial Resource Strain (CARDIA)    Difficulty of Paying Living Expenses: Not very hard  Food Insecurity: No Food Insecurity (07/21/2023)   [facility]   Hunger Vital Sign    Worried About Running Out of Food in  the Last Year: Never true    Ran Out of Food in the Last Year: Never true  Transportation Needs: No Transportation Needs (07/21/2023)   Received from [facility] - Transportation    Lack of Transportation (Medical): No    Lack of Transportation (Non-Medical): No  Physical Activity: Insufficiently Active (07/21/2023)   Received from [facility]   Exercise Vital Sign    Days of Exercise per Week: 1 day    Minutes of Exercise per Session: 30 min  Stress: No Stress Concern Present (07/21/2023)   Received from [facility] - Occupational Stress Questionnaire    Feeling of Stress : Not at all  Social Connections: Somewhat Isolated (07/21/2023)   Received from [facility]   Social Network    How would you rate your social network (family, work, friends)?: Restricted participation with some degree of social isolation    Family History  Problem Relation Age of Onset   Arthritis Mother    Hyperlipidemia Mother    Stroke Mother    Hypertension Mother    Heart disease Mother        valvular heart disease ? which valve.   Cancer Father 16       prostate   Hypertension Father    Cancer Maternal Grandmother        ovarian   Stroke Paternal Grandfather    Sleep apnea Neg Hx     Health Maintenance  Topic Date Due   COVID-19 Vaccine (5 - 2024-25 season) 06/16/2024 (Originally 06/09/2023)   DTaP/Tdap/Td (2 - Td or Tdap) 07/28/2031   Colonoscopy  12/19/2033   INFLUENZA VACCINE  Completed   Hepatitis C Screening  Completed   HIV Screening  Completed   Zoster Vaccines- Shingrix  Completed   Pneumococcal Vaccine 63-31 Years old  Aged Out   HPV VACCINES  Aged Out     ----------------------------------------------------------------------------------------------------------------------------------------------------------------------------------------------------------------- Physical Exam BP 125/80 (BP Location: Left Arm, Patient  Position: Sitting, Cuff Size: Normal)   Pulse (!) 56   Ht 5\' 9"  (1.753 m)   Wt 199 lb (90.3 kg)   SpO2 97%   BMI 29.39 kg/m   Physical Exam Constitutional:      General: He is not in acute distress. HENT:     Head: Normocephalic and atraumatic.     Right Ear: Tympanic membrane and external ear normal.     Left Ear: Tympanic membrane and external ear normal.  Eyes:     General: No scleral icterus. Neck:     Thyroid: No thyromegaly.  Cardiovascular:     Rate and Rhythm: Normal rate  and regular rhythm.     Heart sounds: Normal heart sounds.  Pulmonary:     Effort: Pulmonary effort is normal.     Breath sounds: Normal breath sounds.  Abdominal:     General: Bowel sounds are normal. There is no distension.     Palpations: Abdomen is soft.     Tenderness: There is no abdominal tenderness. There is no guarding.  Musculoskeletal:     Cervical back: Normal range of motion.  Lymphadenopathy:     Cervical: No cervical adenopathy.  Skin:    General: Skin is warm and dry.     Findings: No rash.     Comments: White 3mm Keratotic lesion on scalp, slightly raised.   Neurological:     Mental Status: He is alert and oriented to person, place, and time.     Cranial Nerves: No cranial nerve deficit.     Motor: No abnormal muscle tone.  Psychiatric:        Mood and Affect: Mood normal.        Behavior: Behavior normal.    Procedure:  cryosurgery discussed with patient and all questions answered.  Area on scalp treated with liquid nitrogen x 2 cycles achieving 2mm frost ring around lesion with each cycle.  He tolerated this well. Post procedure instructions discussed.  ------------------------------------------------------------------------------------------------------------------------------------------------------------------------------------------------------------------- Assessment and Plan  Well adult exam Well adult Orders Placed This Encounter  Procedures   CMP14+EGFR   CBC  with Differential/Platelet   Lipid Panel With LDL/HDL Ratio   B12   TSH   PSA  Screenings: per lab orders Immunizations:  UTD Anticipatory guidance/Risk factor reduction:  recommendations per AVS  AK (actinic keratosis) Single lesion on scalp treated with liquid nitrogen today.  Tolerated well.    Calf pain Given handout for HEP.  Will have him schedule with sports med if not improving.    No orders of the defined types were placed in this encounter.   No follow-ups on file.    This visit occurred during the SARS-CoV-2 public health emergency.  Safety protocols were in place, including screening questions prior to the visit, additional usage of staff PPE, and extensive cleaning of exam room while observing appropriate contact time as indicated for disinfecting solutions.

## 2023-12-30 NOTE — Patient Instructions (Signed)

## 2023-12-30 NOTE — Assessment & Plan Note (Signed)
 Single lesion on scalp treated with liquid nitrogen today.  Tolerated well.

## 2023-12-30 NOTE — Assessment & Plan Note (Signed)
 Given handout for HEP.  Will have him schedule with sports med if not improving.

## 2024-01-02 LAB — LIPID PANEL WITH LDL/HDL RATIO
Cholesterol, Total: 156 mg/dL (ref 100–199)
HDL: 32 mg/dL — ABNORMAL LOW (ref >39)
LDL Chol Calc (NIH): 84 mg/dL (ref 0–99)
LDL/HDL Ratio: 2.6 ratio (ref 0.0–3.6)
Triglycerides: 242 mg/dL — ABNORMAL HIGH (ref 0–149)
VLDL Cholesterol Cal: 40 mg/dL (ref 5–40)

## 2024-01-02 LAB — CMP14+EGFR
ALT: 39 IU/L (ref 0–44)
AST: 30 IU/L (ref 0–40)
Albumin: 4.9 g/dL (ref 3.9–4.9)
Alkaline Phosphatase: 73 IU/L (ref 44–121)
BUN/Creatinine Ratio: 12 (ref 10–24)
BUN: 16 mg/dL (ref 8–27)
Bilirubin Total: 1.4 mg/dL — ABNORMAL HIGH (ref 0.0–1.2)
CO2: 22 mmol/L (ref 20–29)
Calcium: 9.6 mg/dL (ref 8.6–10.2)
Chloride: 101 mmol/L (ref 96–106)
Creatinine, Ser: 1.3 mg/dL — ABNORMAL HIGH (ref 0.76–1.27)
Globulin, Total: 2.2 g/dL (ref 1.5–4.5)
Glucose: 102 mg/dL — ABNORMAL HIGH (ref 70–99)
Potassium: 3.8 mmol/L (ref 3.5–5.2)
Sodium: 143 mmol/L (ref 134–144)
Total Protein: 7.1 g/dL (ref 6.0–8.5)
eGFR: 62 mL/min/{1.73_m2} (ref >59)

## 2024-01-02 LAB — CBC WITH DIFFERENTIAL/PLATELET
Basophils Absolute: 0 10*3/uL (ref 0.0–0.2)
Basos: 1 %
EOS (ABSOLUTE): 0.1 10*3/uL (ref 0.0–0.4)
Eos: 2 %
Hematocrit: 46.2 % (ref 37.5–51.0)
Hemoglobin: 15.8 g/dL (ref 13.0–17.7)
Immature Grans (Abs): 0 10*3/uL (ref 0.0–0.1)
Immature Granulocytes: 0 %
Lymphocytes Absolute: 1.4 10*3/uL (ref 0.7–3.1)
Lymphs: 35 %
MCH: 31.5 pg (ref 26.6–33.0)
MCHC: 34.2 g/dL (ref 31.5–35.7)
MCV: 92 fL (ref 79–97)
Monocytes Absolute: 0.5 10*3/uL (ref 0.1–0.9)
Monocytes: 12 %
Neutrophils Absolute: 2 10*3/uL (ref 1.4–7.0)
Neutrophils: 50 %
Platelets: 182 10*3/uL (ref 150–450)
RBC: 5.01 x10E6/uL (ref 4.14–5.80)
RDW: 12.6 % (ref 11.6–15.4)
WBC: 3.9 10*3/uL (ref 3.4–10.8)

## 2024-01-02 LAB — VITAMIN B12: Vitamin B-12: 663 pg/mL (ref 232–1245)

## 2024-01-02 LAB — PSA: Prostate Specific Ag, Serum: 2.3 ng/mL (ref 0.0–4.0)

## 2024-01-02 LAB — TSH: TSH: 1.93 u[IU]/mL (ref 0.450–4.500)

## 2024-01-13 ENCOUNTER — Encounter: Payer: Self-pay | Admitting: Family Medicine

## 2024-03-10 ENCOUNTER — Encounter: Payer: Self-pay | Admitting: Family Medicine

## 2024-03-10 DIAGNOSIS — I10 Essential (primary) hypertension: Secondary | ICD-10-CM

## 2024-03-11 MED ORDER — HYDROCHLOROTHIAZIDE 25 MG PO TABS: 25.0000 mg | ORAL_TABLET | Freq: Every morning | ORAL | 1 refills | Status: DC

## 2024-03-11 MED ORDER — PROPRANOLOL HCL ER 60 MG PO CP24: 60.0000 mg | ORAL_CAPSULE | Freq: Every day | ORAL | 1 refills | Status: DC

## 2024-03-11 MED ORDER — LOSARTAN POTASSIUM 50 MG PO TABS: 50.0000 mg | ORAL_TABLET | Freq: Every day | ORAL | 1 refills | Status: DC

## 2024-03-17 DIAGNOSIS — L814 Other melanin hyperpigmentation: Secondary | ICD-10-CM | POA: Diagnosis not present

## 2024-03-17 DIAGNOSIS — L821 Other seborrheic keratosis: Secondary | ICD-10-CM | POA: Diagnosis not present

## 2024-03-17 DIAGNOSIS — D225 Melanocytic nevi of trunk: Secondary | ICD-10-CM | POA: Diagnosis not present

## 2024-03-17 DIAGNOSIS — L219 Seborrheic dermatitis, unspecified: Secondary | ICD-10-CM | POA: Diagnosis not present

## 2024-03-25 ENCOUNTER — Telehealth: Payer: Self-pay | Admitting: Neurology

## 2024-03-25 NOTE — Telephone Encounter (Signed)
 Pt has called to r/s appointment, will not be available, will be out of town.  Pt has been r/s and is on wait list.

## 2024-03-26 ENCOUNTER — Ambulatory Visit: Payer: BC Managed Care – PPO | Admitting: Neurology

## 2024-05-21 DIAGNOSIS — N400 Enlarged prostate without lower urinary tract symptoms: Secondary | ICD-10-CM | POA: Diagnosis not present

## 2024-05-28 DIAGNOSIS — Z1331 Encounter for screening for depression: Secondary | ICD-10-CM | POA: Diagnosis not present

## 2024-05-28 DIAGNOSIS — N400 Enlarged prostate without lower urinary tract symptoms: Secondary | ICD-10-CM | POA: Diagnosis not present

## 2024-05-31 ENCOUNTER — Encounter: Payer: Self-pay | Admitting: Family Medicine

## 2024-06-01 MED ORDER — ROSUVASTATIN CALCIUM 5 MG PO TABS: 5.0000 mg | ORAL_TABLET | Freq: Every day | ORAL | 2 refills | Status: AC

## 2024-06-15 ENCOUNTER — Ambulatory Visit: Admitting: Medical-Surgical

## 2024-06-15 VITALS — BP 125/77 | HR 62 | Temp 98.8°F | Resp 20 | Ht 69.0 in | Wt 201.0 lb

## 2024-06-15 DIAGNOSIS — J40 Bronchitis, not specified as acute or chronic: Secondary | ICD-10-CM

## 2024-06-15 DIAGNOSIS — J329 Chronic sinusitis, unspecified: Secondary | ICD-10-CM

## 2024-06-15 LAB — POC COVID19/FLU A&B COMBO
Covid Antigen, POC: NEGATIVE
Influenza A Antigen, POC: NEGATIVE
Influenza B Antigen, POC: NEGATIVE

## 2024-06-15 LAB — POCT RAPID STREP A (OFFICE): Rapid Strep A Screen: NEGATIVE

## 2024-06-15 MED ORDER — HYDROCODONE BIT-HOMATROP MBR 5-1.5 MG/5ML PO SOLN: 5.0000 mL | Freq: Three times a day (TID) | ORAL | 0 refills | Status: DC | PRN

## 2024-06-15 MED ORDER — AZITHROMYCIN 250 MG PO TABS: ORAL_TABLET | ORAL | 0 refills | Status: AC

## 2024-06-15 NOTE — Progress Notes (Signed)
        Established patient visit   History of Present Illness   Discussed the use of AI scribe software for clinical note transcription with the patient, who gave verbal consent to proceed.  History of Present Illness   Tim Gordon is a 63 year old male who presents with upper respiratory symptoms persisting for ten days.  Upper respiratory symptoms - Sore throat, nasal congestion, headache, and cough persisting for ten days - Intermittent ear clogging with sensation of plugged ears - Productive cough with phlegm, green in the mornings then lighter in color throughout the day - Symptoms worsen during sleep due to drainage - No fever  Medication response and tolerability - Over-the-counter medications have not provided symptom relief - Cough medicine with codeine previously helped at night but did not resolve current symptoms       Physical Exam   Physical Exam Vitals and nursing note reviewed.  Constitutional:      General: He is not in acute distress.    Appearance: Normal appearance. He is well-developed. He is not ill-appearing.  HENT:     Head: Normocephalic and atraumatic.     Right Ear: Tympanic membrane and ear canal normal. Tympanic membrane is not erythematous.     Left Ear: Tympanic membrane and ear canal normal. Tympanic membrane is not erythematous.     Nose: Congestion present. No rhinorrhea.     Mouth/Throat:     Pharynx: Uvula midline. No oropharyngeal exudate or posterior oropharyngeal erythema.     Tonsils: No tonsillar exudate or tonsillar abscesses. 2+ on the right. 2+ on the left.  Eyes:     Conjunctiva/sclera: Conjunctivae normal.     Pupils: Pupils are equal, round, and reactive to light.  Cardiovascular:     Rate and Rhythm: Normal rate and regular rhythm.     Pulses: Normal pulses.     Heart sounds: Normal heart sounds. No murmur heard.    No friction rub. No gallop.  Pulmonary:     Effort: Pulmonary effort is normal. No respiratory distress.      Breath sounds: Normal breath sounds.  Lymphadenopathy:     Cervical: No cervical adenopathy.  Skin:    General: Skin is warm and dry.  Neurological:     Mental Status: He is alert and oriented to person, place, and time.  Psychiatric:        Mood and Affect: Mood normal.        Behavior: Behavior normal.        Thought Content: Thought content normal.        Judgment: Judgment normal.    Assessment & Plan   Assessment and Plan    Sinobronchitis Symptoms suggest viral infection with possible secondary bacterial infection. Negative swab tests for flu, COVID, and strep. No fever, lungs clear. - Prescribed azithromycin  for secondary bacterial infection. - Prescribed Hycodan cough syrup every 8 hours prn. - Advised wearing a mask to prevent spread if in public/close contact. - Instructed to monitor symptoms and consider steroids if no improvement in a few days. Contact if symptoms persist or worsen.     Follow up   Return if symptoms worsen or fail to improve. __________________________________ Zada FREDRIK Palin, DNP, APRN, FNP-BC Primary Care and Sports Medicine [facility] [place]

## 2024-06-16 ENCOUNTER — Telehealth: Payer: Self-pay | Admitting: Neurology

## 2024-06-16 NOTE — Telephone Encounter (Signed)
 LVM and sent mychart msg informing pt of need to reschedule 06/22/24 appt - MD out

## 2024-06-17 NOTE — Telephone Encounter (Signed)
 Pt has called and r/s his appointment

## 2024-06-19 ENCOUNTER — Encounter: Payer: Self-pay | Admitting: Family Medicine

## 2024-06-22 ENCOUNTER — Ambulatory Visit: Admitting: Neurology

## 2024-07-04 ENCOUNTER — Encounter (HOSPITAL_COMMUNITY): Payer: Self-pay | Admitting: Pharmacy Technician

## 2024-07-04 ENCOUNTER — Emergency Department (HOSPITAL_COMMUNITY)

## 2024-07-04 ENCOUNTER — Other Ambulatory Visit: Payer: Self-pay

## 2024-07-04 ENCOUNTER — Observation Stay (HOSPITAL_COMMUNITY)
Admission: EM | Admit: 2024-07-04 | Discharge: 2024-07-05 | Disposition: A | Discharge status: Home / Self Care | Attending: Internal Medicine | Admitting: Internal Medicine

## 2024-07-04 DIAGNOSIS — F10920 Alcohol use, unspecified with intoxication, uncomplicated: Secondary | ICD-10-CM | POA: Insufficient documentation

## 2024-07-04 DIAGNOSIS — Z72820 Sleep deprivation: Secondary | ICD-10-CM | POA: Diagnosis not present

## 2024-07-04 DIAGNOSIS — G250 Essential tremor: Secondary | ICD-10-CM | POA: Diagnosis not present

## 2024-07-04 DIAGNOSIS — Z7982 Long term (current) use of aspirin: Secondary | ICD-10-CM | POA: Insufficient documentation

## 2024-07-04 DIAGNOSIS — R413 Other amnesia: Principal | ICD-10-CM | POA: Insufficient documentation

## 2024-07-04 DIAGNOSIS — E876 Hypokalemia: Secondary | ICD-10-CM | POA: Diagnosis not present

## 2024-07-04 DIAGNOSIS — E785 Hyperlipidemia, unspecified: Secondary | ICD-10-CM | POA: Diagnosis not present

## 2024-07-04 DIAGNOSIS — G43109 Migraine with aura, not intractable, without status migrainosus: Principal | ICD-10-CM | POA: Insufficient documentation

## 2024-07-04 DIAGNOSIS — R569 Unspecified convulsions: Secondary | ICD-10-CM | POA: Diagnosis not present

## 2024-07-04 DIAGNOSIS — R29818 Other symptoms and signs involving the nervous system: Secondary | ICD-10-CM | POA: Diagnosis not present

## 2024-07-04 DIAGNOSIS — I10 Essential (primary) hypertension: Secondary | ICD-10-CM | POA: Diagnosis not present

## 2024-07-04 DIAGNOSIS — G454 Transient global amnesia: Secondary | ICD-10-CM

## 2024-07-04 DIAGNOSIS — G4733 Obstructive sleep apnea (adult) (pediatric): Secondary | ICD-10-CM | POA: Diagnosis not present

## 2024-07-04 DIAGNOSIS — I6523 Occlusion and stenosis of bilateral carotid arteries: Secondary | ICD-10-CM | POA: Diagnosis not present

## 2024-07-04 DIAGNOSIS — Z79899 Other long term (current) drug therapy: Secondary | ICD-10-CM | POA: Insufficient documentation

## 2024-07-04 DIAGNOSIS — R4182 Altered mental status, unspecified: Secondary | ICD-10-CM | POA: Diagnosis not present

## 2024-07-04 LAB — CBC WITH DIFFERENTIAL/PLATELET
Abs Immature Granulocytes: 0.01 K/uL (ref 0.00–0.07)
Basophils Absolute: 0 K/uL (ref 0.0–0.1)
Basophils Relative: 1 %
Eosinophils Absolute: 0.1 K/uL (ref 0.0–0.5)
Eosinophils Relative: 1 %
HCT: 45.4 % (ref 39.0–52.0)
Hemoglobin: 15.8 g/dL (ref 13.0–17.0)
Immature Granulocytes: 0 %
Lymphocytes Relative: 31 %
Lymphs Abs: 1.2 K/uL (ref 0.7–4.0)
MCH: 31.9 pg (ref 26.0–34.0)
MCHC: 34.8 g/dL (ref 30.0–36.0)
MCV: 91.5 fL (ref 80.0–100.0)
Monocytes Absolute: 0.5 K/uL (ref 0.1–1.0)
Monocytes Relative: 14 %
Neutro Abs: 2.1 K/uL (ref 1.7–7.7)
Neutrophils Relative %: 53 %
Platelets: 160 K/uL (ref 150–400)
RBC: 4.96 MIL/uL (ref 4.22–5.81)
RDW: 12.7 % (ref 11.5–15.5)
WBC: 3.9 K/uL — ABNORMAL LOW (ref 4.0–10.5)
nRBC: 0 % (ref 0.0–0.2)

## 2024-07-04 LAB — I-STAT CHEM 8, ED
BUN: 20 mg/dL (ref 8–23)
Calcium, Ion: 1.19 mmol/L (ref 1.15–1.40)
Chloride: 103 mmol/L (ref 98–111)
Creatinine, Ser: 1.1 mg/dL (ref 0.61–1.24)
Glucose, Bld: 117 mg/dL — ABNORMAL HIGH (ref 70–99)
HCT: 41 % (ref 39.0–52.0)
Hemoglobin: 13.9 g/dL (ref 13.0–17.0)
Potassium: 3.4 mmol/L — ABNORMAL LOW (ref 3.5–5.1)
Sodium: 142 mmol/L (ref 135–145)
TCO2: 27 mmol/L (ref 22–32)

## 2024-07-04 LAB — COMPREHENSIVE METABOLIC PANEL WITH GFR
ALT: 31 U/L (ref 0–44)
AST: 31 U/L (ref 15–41)
Albumin: 4.3 g/dL (ref 3.5–5.0)
Alkaline Phosphatase: 57 U/L (ref 38–126)
Anion gap: 10 (ref 5–15)
BUN: 19 mg/dL (ref 8–23)
CO2: 27 mmol/L (ref 22–32)
Calcium: 9.1 mg/dL (ref 8.9–10.3)
Chloride: 101 mmol/L (ref 98–111)
Creatinine, Ser: 1.08 mg/dL (ref 0.61–1.24)
GFR, Estimated: >60 mL/min (ref >60)
Glucose, Bld: 120 mg/dL — ABNORMAL HIGH (ref 70–99)
Potassium: 3.3 mmol/L — ABNORMAL LOW (ref 3.5–5.1)
Sodium: 138 mmol/L (ref 135–145)
Total Bilirubin: 1.8 mg/dL — ABNORMAL HIGH (ref 0.0–1.2)
Total Protein: 6.8 g/dL (ref 6.5–8.1)

## 2024-07-04 LAB — VITAMIN B12: Vitamin B-12: 474 pg/mL (ref 180–914)

## 2024-07-04 LAB — URINALYSIS, ROUTINE W REFLEX MICROSCOPIC
Bilirubin Urine: NEGATIVE
Glucose, UA: NEGATIVE mg/dL
Hgb urine dipstick: NEGATIVE
Ketones, ur: NEGATIVE mg/dL
Leukocytes,Ua: NEGATIVE
Nitrite: NEGATIVE
Protein, ur: NEGATIVE mg/dL
Specific Gravity, Urine: 1.008 (ref 1.005–1.030)
pH: 7 (ref 5.0–8.0)

## 2024-07-04 LAB — FOLATE: Folate: >20 ng/mL (ref >5.9)

## 2024-07-04 LAB — TSH: TSH: 1.511 u[IU]/mL (ref 0.350–4.500)

## 2024-07-04 LAB — C-REACTIVE PROTEIN: CRP: 1 mg/dL — ABNORMAL HIGH (ref <1.0)

## 2024-07-04 LAB — MAGNESIUM: Magnesium: 2.3 mg/dL (ref 1.7–2.4)

## 2024-07-04 LAB — SEDIMENTATION RATE: Sed Rate: 1 mm/h (ref 0–16)

## 2024-07-04 MED ORDER — ACETAMINOPHEN 160 MG/5ML PO SOLN: 650.0000 mg | Freq: Four times a day (QID) | ORAL | Status: DC | PRN

## 2024-07-04 MED ORDER — GADOBUTROL 1 MMOL/ML IV SOLN
9.0000 mL | Freq: Once | INTRAVENOUS | Status: AC | PRN
Start: 2024-07-04 — End: 2024-07-04
  Administered 2024-07-04: 9 mL via INTRAVENOUS

## 2024-07-04 MED ORDER — STROKE: EARLY STAGES OF RECOVERY BOOK
Freq: Once | Status: AC
  Filled 2024-07-04: qty 1

## 2024-07-04 MED ORDER — ACETAMINOPHEN 325 MG PO TABS
650.0000 mg | ORAL_TABLET | Freq: Four times a day (QID) | ORAL | Status: DC | PRN
  Administered 2024-07-05: 650 mg via ORAL
  Filled 2024-07-04: qty 2

## 2024-07-04 MED ORDER — ACETAMINOPHEN 650 MG RE SUPP: 650.0000 mg | Freq: Four times a day (QID) | RECTAL | Status: DC | PRN

## 2024-07-04 MED ORDER — ROSUVASTATIN CALCIUM 5 MG PO TABS
5.0000 mg | ORAL_TABLET | Freq: Every day | ORAL | Status: DC
Start: 2024-07-05 — End: 2024-07-05
  Filled 2024-07-04: qty 1

## 2024-07-04 MED ORDER — POTASSIUM CHLORIDE CRYS ER 20 MEQ PO TBCR
40.0000 meq | EXTENDED_RELEASE_TABLET | Freq: Once | ORAL | Status: AC
  Administered 2024-07-04: 40 meq via ORAL
  Filled 2024-07-04: qty 2

## 2024-07-04 MED ORDER — ASPIRIN 81 MG PO TBEC
81.0000 mg | DELAYED_RELEASE_TABLET | Freq: Every day | ORAL | Status: DC
  Administered 2024-07-04 – 2024-07-05 (×2): 81 mg via ORAL
  Filled 2024-07-04 (×2): qty 1

## 2024-07-04 MED ORDER — IOHEXOL 350 MG/ML SOLN
75.0000 mL | Freq: Once | INTRAVENOUS | Status: AC | PRN
  Administered 2024-07-04: 75 mL via INTRAVENOUS

## 2024-07-04 MED ORDER — ENOXAPARIN SODIUM 40 MG/0.4ML IJ SOSY
40.0000 mg | PREFILLED_SYRINGE | INTRAMUSCULAR | Status: DC
  Filled 2024-07-04: qty 0.4

## 2024-07-04 MED ORDER — CLOPIDOGREL BISULFATE 75 MG PO TABS
75.0000 mg | ORAL_TABLET | Freq: Every day | ORAL | Status: DC
  Administered 2024-07-04 – 2024-07-05 (×2): 75 mg via ORAL
  Filled 2024-07-04 (×2): qty 1

## 2024-07-04 MED ORDER — ACETAMINOPHEN 325 MG PO TABS
650.0000 mg | ORAL_TABLET | Freq: Once | ORAL | Status: AC
  Administered 2024-07-04: 650 mg via ORAL
  Filled 2024-07-04: qty 2

## 2024-07-04 NOTE — Consult Note (Signed)
 NEUROLOGY CONSULT NOTE   Date of service: July 04, 2024 Patient Name: Tim Gordon MRN:  [id number] DOB:  08/21/1961 Chief Complaint: memory changes Requesting Provider: Simon Lavonia SAILOR, MD  History of Present Illness  Tim Gordon is a 63 y.o. male with hx of HTN, HLD, OSA (on AutoPAP), Migraines, Ocular Migraines, Essential tremor (controlled with propanolol), mitral valve prolapse diverticulitis with status post partial colectomy in 2015 who presented to ED after change in mental status with repetitive questions and inability to name president or current month.   Patient was noted to be in usual state of health last night before bed, 2330. He began having difficulty remembering how to do simple tasks and forgot his computer password. He mentioned this to his wife, and then a few minutes later, told her the same thing without realizing he was repeating himself. Denies any staring off spells or seizure-like activity.   On exam today, patient is oriented to self, place, month, year, situation. He does forget some exact details of today per wife. 1/3 word recall. No focal or sensory deficits. Endorses a frontal headache, states it does not feel like a migraine.   Patient states that he had an URI about 3 weeks ago. He does travel frequently by plane for work, last to [place] just a few days ago. Says he became very weak yesterday after hitting golf balls at the driving range, he figured it was because he got hot.   At outpatient neurology visit 06/24/2023, MD noted that wife was worried about his forgetfulness which she said was more noticeable in the 6 months prior to this appointment. Essential tremor was noted to be under control with use of Propanalol  ROS  Comprehensive ROS performed and pertinent positives documented in HPI   Past History   Past Medical History:  Diagnosis Date   Allergy    Blood in stool    Diverticulitis    GERD (gastroesophageal reflux disease)     CONTROLLED WITH DIET - NO MEDS   History of kidney stones    Hyperlipidemia    Hypertension    Migraines    OCULAR MIGRAINES   Pneumonia    walking   Sleep apnea    STATES SLEEP STUDY 4 OR 5 YEARS AGO - TOLD BORDERLINE - TRIED CPAP FOR 30 DAYS - STATES IT DID NOT HELP - NO LONGER USES.    Past Surgical History:  Procedure Laterality Date   APPENDECTOMY     COLON SURGERY     COLONOSCOPY WITH PROPOFOL  N/A 12/20/2023   Procedure: COLONOSCOPY WITH PROPOFOL ;  Surgeon: Rollin Dover, MD;  Location: [facility];  Service: Gastroenterology;  Laterality: N/A;   INGUINAL HERNIA REPAIR Right 12/28/2021   Procedure: LAPAROSCOPIC RIGHT INGUINAL HERNIA REPAIR WITH MESH;  Surgeon: Vernetta Berg, MD;  Location: [facility] OR;  Service: General;  Laterality: Right;   LAPAROSCOPIC PARTIAL COLECTOMY N/A 06/24/2014   Procedure: LAPAROSCOPIC ASSISTED  PARTIAL COLECTOMY;  Surgeon: Krystal Russell, MD;  Location: [facility];  Service: General;  Laterality: N/A;   NASAL RECONSTRUCTION WITH SEPTAL REPAIR     POLYPECTOMY  12/20/2023   Procedure: POLYPECTOMY;  Surgeon: Rollin Dover, MD;  Location: [facility];  Service: Gastroenterology;;   SPINE SURGERY  2001   rupture L4-L5   VASECTOMY      Family History: Family History  Problem Relation Age of Onset   Arthritis Mother    Hyperlipidemia Mother    Stroke Mother    Hypertension Mother  Heart disease Mother        valvular heart disease ? which valve.   Cancer Father 42       prostate   Hypertension Father    Cancer Maternal Grandmother        ovarian   Stroke Paternal Grandfather    Sleep apnea Neg Hx     Social History  reports that he has never smoked. He has never been exposed to tobacco smoke. He has never used smokeless tobacco. He reports current alcohol use. He reports that he does not use drugs.  Allergies  Allergen Reactions   Flomax  [Tamsulosin ]     Dizziness, felt funny   Biaxin [Clarithromycin] Diarrhea    Medications  No  current facility-administered medications for this encounter.  Current Outpatient Medications:    albuterol  (VENTOLIN  HFA) 108 (90 Base) MCG/ACT inhaler, Inhale 2 puffs into the lungs every 6 (six) hours as needed for wheezing or shortness of breath., Disp: 8 g, Rfl: 0   hydrochlorothiazide  (HYDRODIURIL ) 25 MG tablet, Take 1 tablet (25 mg total) by mouth every morning., Disp: 90 tablet, Rfl: 1   HYDROcodone  bit-homatropine (HYCODAN) 5-1.5 MG/5ML syrup, Take 5 mLs by mouth every 8 (eight) hours as needed for cough., Disp: 120 mL, Rfl: 0   KRILL OIL PO, Take by mouth., Disp: , Rfl:    loratadine (CLARITIN) 10 MG tablet, Take 10 mg by mouth at bedtime., Disp: , Rfl:    losartan  (COZAAR ) 50 MG tablet, Take 1 tablet (50 mg total) by mouth daily., Disp: 90 tablet, Rfl: 1   propranolol  ER (INDERAL  LA) 60 MG 24 hr capsule, Take 1 capsule (60 mg total) by mouth daily., Disp: 90 capsule, Rfl: 1   rizatriptan  (MAXALT -MLT) 5 MG disintegrating tablet, Take 1 tablet (5 mg total) by mouth as needed for migraine. May repeat in 2 hours if needed, Disp: 10 tablet, Rfl: 0   rosuvastatin  (CRESTOR ) 5 MG tablet, Take 1 tablet (5 mg total) by mouth daily., Disp: 90 tablet, Rfl: 2  Vitals   Vitals:   07/04/24 1244 07/04/24 1445 07/04/24 1530 07/04/24 1553  BP:  136/87 128/86   Pulse:  (!) 56 (!) 57   Resp:  20 16   Temp:    98.2 F (36.8 C)  TempSrc:    Oral  SpO2:  100% 99%   Weight: 90.7 kg     Height: 5' 9 (1.753 m)       Body mass index is 29.53 kg/m.   Physical Exam   Constitutional: Appears well-developed and well-nourished.  Cardiovascular: Normal rate and regular rhythm.  Respiratory: Effort normal, non-labored breathing.   Neurologic Examination   Neuro: Mental Status: Patient is awake, alert, oriented to person, place, month, year, and situation. Patient is able to give good history, but does forget small details that wife at bedside states that he has taked about with her before.  No  signs of aphasia or neglect. Able to name simple objects, repetition intact. 1/3 word recall. Cranial Nerves: II: Visual Fields are full. Pupils are equal, round, and reactive to light.   III,IV, VI: EOMI without ptosis or diploplia.  V: Facial sensation is symmetric to light touch VII: Facial movement is symmetric.  VIII: hearing is intact to voice X: Uvula elevates symmetrically. NO dysarthria.  XI: Shoulder shrug is symmetric. XII: tongue is midline  Motor: Tone is normal. Bulk is normal. 5/5 strength was present in all four extremities.  Sensory: Sensation is symmetric to light  touch and temperature in the arms and legs. Cerebellar: FNF intact bilaterally   Labs/Imaging/Neurodiagnostic studies   CBC:  Recent Labs  Lab July 31, 2024 1202 2024/07/31 1243  WBC 3.9*  --   NEUTROABS 2.1  --   HGB 15.8 13.9  HCT 45.4 41.0  MCV 91.5  --   PLT 160  --    Basic Metabolic Panel:  Lab Results  Component Value Date   NA 142 31-Jul-2024   K 3.4 (L) 2024-07-31   CO2 27 07/31/2024   GLUCOSE 117 (H) 07-31-24   BUN 20 2024/07/31   CREATININE 1.10 07-31-2024   CALCIUM  9.1 07-31-24   GFRNONAA >60 07/31/24   GFRAA >60 07/14/2019   Lipid Panel:  Lab Results  Component Value Date   LDLCALC 84 12/30/2023   HgbA1c: No results found for: HGBA1C Urine Drug Screen: No results found for: LABOPIA, COCAINSCRNUR, LABBENZ, AMPHETMU, THCU, LABBARB  Alcohol Level No results found for: [facility] INR  Lab Results  Component Value Date   INR 1.12 06/18/2014   APTT No results found for: APTT AED levels: No results found for: PHENYTOIN, ZONISAMIDE, LAMOTRIGINE, LEVETIRACETA  CT Head without contrast(Personally reviewed): No acute abnormality  CT angio Head and Neck with contrast(Personally reviewed): No large vessel occlusion, hemodynamically significant stenosis, or aneurysm in the head or neck  MRI Brain with and without(Personally  reviewed): pending   ASSESSMENT   Tim Gordon is a 63 y.o. male with hx of HTN, HLD, OSA (on AutoPAP), Migraines, Ocular Migraines, Essential tremor (controlled with propanolol) who presented with altered mental status, short-term memory issues.   Patient states his memory has improved and he is oriented now. However, he still is forgetting some details of his day. 1/3 word recall. No focal deficits. He does have a frontal headache, intermittent pressure.   UA negative. Labs unremarkable, sans minor hypokalemia. No anemia or leukocytosis.   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 Was repeating himself, did not know the current month, year, forgot half of his computer password.  Etiology I suspect was likely secondary to a TIA like event, less likely but could be a focal seizure. Less concerning but exhaustion secondary to poor sleep quality may also be playing a role.   RECOMMENDATIONS  - Frequent Neuro checks per stroke unit protocol - Recommend obtaining TTE - Recommend obtaining Lipid panel with LDL - Please start statin if LDL > 70 - Recommend HbA1c to evaluate for diabetes and how well it is controlled. - Antithrombotic - aspirin 81mg  daily along with plavix 75mg  daily x 21days, followed by Aspirin 81mg  daily alone. - Recommend DVT ppx - SBP goal - permissive hypertension first 24 h < 220/110. Held home meds.  - Recommend Telemetry monitoring for arrythmia - Recommend bedside swallow screen prior to PO intake. - Stroke education booklet - Recommend PT/OT/SLP consult - routine EEG in addition to stroke workup above to evaluate for any epileptiform discharges.  ______________________________________________________________________    Tim Rocky JAYSON Judithe, NP [facility]   NEUROHOSPITALIST ADDENDUM Performed a face to face diagnostic evaluation.   I have reviewed the contents of history and physical exam as documented by PA/ARNP/Resident and agree with above  documentation.  I have discussed and formulated the above plan as documented. Edits to the note have been made as needed.  Impression/Key exam findings/Plan: episode seems consistent with TGA. He seems to be improving. Risk factors for stroke/TIA include HTN, HLD, OSA on CPAP. Endorses only getting 3 hours of sleep a  night. 3/3 recall immediately, 1/3 recall at 5 mins. Neuro exam is otherwise normal with no cognitive impairment or weakness.  In addition to stroke workup, will also get routine EEG.  Anastasha Ortez, MD [facility] [phone]   If 7pm to 7am, please call on call as listed on AMION.

## 2024-07-04 NOTE — ED Notes (Signed)
 Patient transported to MRI

## 2024-07-04 NOTE — ED Notes (Signed)
 CCMD called.

## 2024-07-04 NOTE — ED Provider Notes (Signed)
 [facility] AT [facility] Provider Note   CSN: [id number] Arrival date & time: 07/04/24  1129     Patient presents with: Altered Mental Status   Tim Gordon is a 63 y.o. male.    Altered Mental Status    Patient presented to the ED for evaluation of memory difficulties.  Patient went to bed last evening and was fine.  Last known well was 11:30 PM.  This morning when he woke up he was having difficulty remembering simple tasks, his password to the computer conversations that he had recently.  Patient has a password to use on his work computer that he uses all the time and generally should not have any difficulty remembering it.  Patient was not able to remember his password.  He ended up mentioning this to his wife.  Several minutes later he ended up saying the same thing not remembering that this had occurred.  Patient was not able to recall the president or what month it was.  Patient still feels like he is having difficulty with his memory and recall.  He is not having any trouble with his speech.  No trouble with his vision.  He is not having any trouble with his balance or coordination  Prior to Admission medications   Medication Sig Start Date End Date Taking? Authorizing Provider  albuterol  (VENTOLIN  HFA) 108 (90 Base) MCG/ACT inhaler Inhale 2 puffs into the lungs every 6 (six) hours as needed for wheezing or shortness of breath. 03/13/23   Alvia Bring, DO  hydrochlorothiazide  (HYDRODIURIL ) 25 MG tablet Take 1 tablet (25 mg total) by mouth every morning. 03/11/24   Alvia Bring, DO  HYDROcodone  bit-homatropine (HYCODAN) 5-1.5 MG/5ML syrup Take 5 mLs by mouth every 8 (eight) hours as needed for cough. 06/15/24   Willo Mini, NP  KRILL OIL PO Take by mouth.    [provider]  loratadine (CLARITIN) 10 MG tablet Take 10 mg by mouth at bedtime.    [provider]  losartan  (COZAAR ) 50 MG tablet Take 1 tablet (50 mg total) by mouth daily.  03/11/24   Alvia Bring, DO  propranolol  ER (INDERAL  LA) 60 MG 24 hr capsule Take 1 capsule (60 mg total) by mouth daily. 03/11/24   Alvia Bring, DO  rizatriptan  (MAXALT -MLT) 5 MG disintegrating tablet Take 1 tablet (5 mg total) by mouth as needed for migraine. May repeat in 2 hours if needed 03/27/22   Athar, Saima, MD  rosuvastatin  (CRESTOR ) 5 MG tablet Take 1 tablet (5 mg total) by mouth daily. 06/01/24   Alvia Bring, DO    Allergies: Flomax  [tamsulosin ] and Biaxin [clarithromycin]    Review of Systems  Updated Vital Signs BP 136/87   Pulse (!) 56   Temp (!) 97.4 F (36.3 C)   Resp 20   Ht 1.753 m (5' 9)   Wt 90.7 kg   SpO2 100%   BMI 29.53 kg/m   Physical Exam Vitals and nursing note reviewed.  Constitutional:      General: He is not in acute distress.    Appearance: He is well-developed.  HENT:     Head: Normocephalic and atraumatic.     Right Ear: External ear normal.     Left Ear: External ear normal.  Eyes:     General: No visual field deficit or scleral icterus.       Right eye: No discharge.        Left eye: No discharge.  Conjunctiva/sclera: Conjunctivae normal.  Neck:     Trachea: No tracheal deviation.  Cardiovascular:     Rate and Rhythm: Normal rate and regular rhythm.  Pulmonary:     Effort: Pulmonary effort is normal. No respiratory distress.     Breath sounds: Normal breath sounds. No stridor. No wheezing or rales.  Abdominal:     General: Bowel sounds are normal. There is no distension.     Palpations: Abdomen is soft.     Tenderness: There is no abdominal tenderness. There is no guarding or rebound.  Musculoskeletal:        General: No tenderness.     Cervical back: Neck supple.  Skin:    General: Skin is warm and dry.     Findings: No rash.  Neurological:     Mental Status: He is alert and oriented to person, place, and time.     Cranial Nerves: No cranial nerve deficit, dysarthria or facial asymmetry.     Sensory: No sensory deficit.      Motor: Tremor present. No abnormal muscle tone, seizure activity or pronator drift.     Coordination: Coordination normal.     Comments:  able to hold both legs off bed for 5 seconds, sensation intact in all extremities,  no left or right sided neglect, normal finger-nose exam bilaterally, no nystagmus noted, patient able to name objects such as watch TV  Intention tremor noted, chronic per patient  Psychiatric:        Mood and Affect: Mood normal.     (all labs ordered are listed, but only abnormal results are displayed) Labs Reviewed  COMPREHENSIVE METABOLIC PANEL WITH GFR - Abnormal; Notable for the following components:      Result Value   Potassium 3.3 (*)    Glucose, Bld 120 (*)    Total Bilirubin 1.8 (*)    All other components within normal limits  CBC WITH DIFFERENTIAL/PLATELET - Abnormal; Notable for the following components:   WBC 3.9 (*)    All other components within normal limits  URINALYSIS, ROUTINE W REFLEX MICROSCOPIC - Abnormal; Notable for the following components:   Color, Urine STRAW (*)    All other components within normal limits  I-STAT CHEM 8, ED - Abnormal; Notable for the following components:   Potassium 3.4 (*)    Glucose, Bld 117 (*)    All other components within normal limits    EKG: EKG Interpretation Date/Time:  Saturday July 04 2024 11:40:04 EDT Ventricular Rate:  63 PR Interval:  150 QRS Duration:  98 QT Interval:  412 QTC Calculation: 421 R Axis:   35  Text Interpretation: Normal sinus rhythm Normal ECG When compared with ECG of 26-Dec-2021 08:46, No significant change since last tracing Confirmed by Randol Simmonds [phone]) on 07/04/2024 2:56:01 PM  Radiology: CT ANGIO HEAD NECK W WO CM Result Date: 07/04/2024 EXAM: CT HEAD WITHOUT CTA HEAD AND NECK WITH AND WITHOUT 07/04/2024 02:30:26 PM TECHNIQUE: CTA of the head and neck was performed with and without the administration of intravenous contrast. 75 mL (iohexol  (OMNIPAQUE ) 350  MG/ML injection 75 mL IOHEXOL  350 MG/ML SOLN) was administered. Noncontrast CT of the head with reconstructed 2-D images are also provided for review. Multiplanar 2D and/or 3D reformatted images are provided for review. Automated exposure control, iterative reconstruction, and/or weight based adjustment of the mA/kV was utilized to reduce the radiation dose to as low as reasonably achievable. COMPARISON: MR head without and with contrast 05/09/2022. CLINICAL HISTORY: Neuro  deficit, acute, stroke suspected. Acute onset of memory problems. FINDINGS: CT HEAD: BRAIN AND VENTRICLES: No acute intracranial hemorrhage. No mass effect or midline shift. No extra-axial fluid collection. Gray-white differentiation is maintained. No hydrocephalus. ORBITS: No acute abnormality. SINUSES: No acute abnormality. SOFT TISSUES AND SKULL: No acute abnormality. CTA NECK: AORTIC ARCH AND ARCH VESSELS: No dissection or arterial injury. No significant stenosis of the brachiocephalic or subclavian arteries. CERVICAL CAROTID ARTERIES: Minimal atherosclerotic change is present in the proximal left ICA without significant stenosis relative to the more distal vessel. No dissection, arterial injury, or hemodynamically significant stenosis by NASCET criteria. CERVICAL VERTEBRAL ARTERIES: No dissection, arterial injury, or significant stenosis. VISUALIZED LUNGS AND MEDIASTINUM: Unremarkable. SOFT TISSUES: No acute abnormality. BONES: Straightening of the normal cervical lordosis is present. Uncovertebral and facet hypertrophy results in moderate right foraminal stenosis at C3-C4. CTA HEAD: ANTERIOR CIRCULATION: No significant stenosis of the internal carotid arteries. No significant stenosis of the anterior cerebral arteries. No significant stenosis of the middle cerebral arteries. No aneurysm. POSTERIOR CIRCULATION: Fetal type posterior cerebral arteries are present bilaterally. The basilar artery terminates at the superior cerebellar arteries, a  normal variant. No significant stenosis of the posterior cerebral arteries. No significant stenosis of the basilar artery. No significant stenosis of the vertebral arteries. No aneurysm. OTHER: No dural venous sinus thrombosis on this non-dedicated study. IMPRESSION: 1. No acute intracranial hemorrhage. 2. No large vessel occlusion, hemodynamically significant stenosis, or aneurysm in the head or neck. 3. Minimal atherosclerotic change in the proximal left ICA without significant stenosis relative to the more distal vessel. Electronically signed by: Lonni Necessary MD 07/04/2024 02:47 PM EDT RP Workstation: [id number]     Procedures   Medications Ordered in the ED  iohexol  (OMNIPAQUE ) 350 MG/ML injection 75 mL (75 mLs Intravenous Contrast Given 07/04/24 1432)  acetaminophen  (TYLENOL ) tablet 650 mg (650 mg Oral Given 07/04/24 1502)    Clinical Course as of 07/04/24 1516  Sat Jul 04, 2024  1449 CBC metabolic panel unremarkable.  Urinalysis negative.  CT of head does not show any signs of acute hemorrhage.  Angiogram does not show any signs of stenosis or aneurysm [JK]  1504 Case discussed with Dr. Michaela.  Will proceed with MRI.  He will also see patient in consultation [JK]    Clinical Course User Index [JK] Randol Simmonds, MD                                 Medical Decision Making Problems Addressed: Memory loss: acute illness or injury that poses a threat to life or bodily functions  Amount and/or Complexity of Data Reviewed Labs: ordered. Decision-making details documented in ED Course. Radiology: ordered and independent interpretation performed.  Risk OTC drugs. Prescription drug management.   Patient presented to the ED with acute memory issues.  Patient forgot a common password that he uses all the time.  He was also forgetting conversations that he just had with his wife.  He was not able to remember the president as well as the date  On exam he has no other focal  neurologic deficits.  Patient does have an intention tremor but this is a chronic issue for him and not acutely associated with this event today.  Concerned about the possibility of occult stroke, transient global amnesia.  No history to suggest seizure.  No metabolic abnormalities noted to account for his memory issues.  Consulted with neurology Dr. Michaela  who will see patient.  We also proceed with MRI evaluation.  Case turned over to Dr. Simon at shift change     Final diagnoses:  Memory loss    ED Discharge Orders     None          Randol Simmonds, MD 07/04/24 [phone]

## 2024-07-04 NOTE — ED Notes (Signed)
 Called lab to check on urine, they advised that they never received urine for this patient. They did state that they had received unlabelled urine from ED earlier that may have been his. Patient informed we needed another sample.

## 2024-07-04 NOTE — ED Triage Notes (Signed)
 LSN 11:30 when pt went to bed.  Wife states pt is asking same questions.  Pt told her he couldn't remember his password to work computer.  Several minutes later he mentioned the same.  Also could not remember month or name of president.

## 2024-07-04 NOTE — ED Notes (Signed)
 Patient transported to CT

## 2024-07-04 NOTE — H&P (Signed)
 History and Physical    Tim Gordon FMW:[id number] DOB: [birth year] DOA: 07/04/2024  PCP: Alvia Bring, DO  Patient coming from: Home  Chief Complaint: Memory difficulties  HPI: Tim Gordon is a 63 y.o. male with medical history significant of hypertension, hyperlipidemia, migraines, essential tremor, OSA on CPAP presented to the ED for evaluation of amnesia.  LKW 11:30 PM yesterday night.  This morning after waking up, patient was having difficulty remembering simple tasks, his computer password, and conversations that he recently had.  No difficulty with speech or vision.  No focal weakness or numbness.  Denies history of previous stroke.  No seizure-like activity noted by family.  No other complaints.  Patient denies fevers, cough, shortness of breath, chest pain, vomiting, abdominal pain, diarrhea, or any urinary symptoms.  ED Course: Slightly bradycardic with heart rate in the 50s.  Labs notable for WBC count 3.9, potassium 3.3, T. bili 1.8 (chronic mild elevation), transaminases and alkaline phosphatase normal, UA not suggestive of infection, B12 level within normal range, ESR normal, TSH normal, folate >20.0, B1 level pending, CRP 1.0, magnesium within normal range.  CTA head and neck negative for acute intracranial hemorrhage or LVO.  Brain MRI showing no acute intracranial abnormality.  Neurology suspecting transient global amnesia and recommended admission for TIA versus less likely focal seizure workup.  Review of Systems:  Review of Systems  All other systems reviewed and are negative.   Past Medical History:  Diagnosis Date   Allergy    Blood in stool    Diverticulitis    GERD (gastroesophageal reflux disease)    CONTROLLED WITH DIET - NO MEDS   History of kidney stones    Hyperlipidemia    Hypertension    Migraines    OCULAR MIGRAINES   Pneumonia    walking   Sleep apnea    STATES SLEEP STUDY 4 OR 5 YEARS AGO - TOLD BORDERLINE - TRIED CPAP FOR 30 DAYS - STATES IT  DID NOT HELP - NO LONGER USES.    Past Surgical History:  Procedure Laterality Date   APPENDECTOMY     COLON SURGERY     COLONOSCOPY WITH PROPOFOL  N/A 12/20/2023   Procedure: COLONOSCOPY WITH PROPOFOL ;  Surgeon: Rollin Dover, MD;  Location: [facility];  Service: Gastroenterology;  Laterality: N/A;   INGUINAL HERNIA REPAIR Right 12/28/2021   Procedure: LAPAROSCOPIC RIGHT INGUINAL HERNIA REPAIR WITH MESH;  Surgeon: Vernetta Berg, MD;  Location: [facility] OR;  Service: General;  Laterality: Right;   LAPAROSCOPIC PARTIAL COLECTOMY N/A 06/24/2014   Procedure: LAPAROSCOPIC ASSISTED  PARTIAL COLECTOMY;  Surgeon: Krystal Russell, MD;  Location: [facility];  Service: General;  Laterality: N/A;   NASAL RECONSTRUCTION WITH SEPTAL REPAIR     POLYPECTOMY  12/20/2023   Procedure: POLYPECTOMY;  Surgeon: Rollin Dover, MD;  Location: [facility];  Service: Gastroenterology;;   SPINE SURGERY  2001   rupture L4-L5   VASECTOMY       reports that he has never smoked. He has never been exposed to tobacco smoke. He has never used smokeless tobacco. He reports current alcohol use. He reports that he does not use drugs.  Allergies  Allergen Reactions   Flomax  [Tamsulosin ]     Dizziness, felt funny   Biaxin [Clarithromycin] Diarrhea    Family History  Problem Relation Age of Onset   Arthritis Mother    Hyperlipidemia Mother    Stroke Mother    Hypertension Mother    Heart disease Mother  valvular heart disease ? which valve.   Cancer Father 29       prostate   Hypertension Father    Cancer Maternal Grandmother        ovarian   Stroke Paternal Grandfather    Sleep apnea Neg Hx     Prior to Admission medications   Medication Sig Start Date End Date Taking? Authorizing Provider  albuterol  (VENTOLIN  HFA) 108 (90 Base) MCG/ACT inhaler Inhale 2 puffs into the lungs every 6 (six) hours as needed for wheezing or shortness of breath. 03/13/23   Alvia Bring, DO  hydrochlorothiazide  (HYDRODIURIL ) 25  MG tablet Take 1 tablet (25 mg total) by mouth every morning. 03/11/24   Alvia Bring, DO  HYDROcodone  bit-homatropine (HYCODAN) 5-1.5 MG/5ML syrup Take 5 mLs by mouth every 8 (eight) hours as needed for cough. 06/15/24   Willo Mini, NP  KRILL OIL PO Take by mouth.    [provider]  loratadine (CLARITIN) 10 MG tablet Take 10 mg by mouth at bedtime.    [provider]  losartan  (COZAAR ) 50 MG tablet Take 1 tablet (50 mg total) by mouth daily. 03/11/24   Alvia Bring, DO  propranolol  ER (INDERAL  LA) 60 MG 24 hr capsule Take 1 capsule (60 mg total) by mouth daily. 03/11/24   Alvia Bring, DO  rizatriptan  (MAXALT -MLT) 5 MG disintegrating tablet Take 1 tablet (5 mg total) by mouth as needed for migraine. May repeat in 2 hours if needed 03/27/22   Athar, Saima, MD  rosuvastatin  (CRESTOR ) 5 MG tablet Take 1 tablet (5 mg total) by mouth daily. 06/01/24   Alvia Bring, DO    Physical Exam: Vitals:   07/04/24 1445 07/04/24 1530 07/04/24 1553 07/04/24 1836  BP: 136/87 128/86  127/77  Pulse: (!) 56 (!) 57  60  Resp: 20 16  20   Temp:   98.2 F (36.8 C) 98 F (36.7 C)  TempSrc:   Oral Oral  SpO2: 100% 99%  100%  Weight:      Height:        Physical Exam Vitals reviewed.  Constitutional:      General: He is not in acute distress. HENT:     Head: Normocephalic and atraumatic.  Eyes:     Extraocular Movements: Extraocular movements intact.  Cardiovascular:     Rate and Rhythm: Regular rhythm. Bradycardia present.     Heart sounds: Normal heart sounds.  Pulmonary:     Effort: Pulmonary effort is normal. No respiratory distress.     Breath sounds: Normal breath sounds.  Abdominal:     General: Bowel sounds are normal. There is no distension.     Palpations: Abdomen is soft.     Tenderness: There is no abdominal tenderness.  Musculoskeletal:     Cervical back: Normal range of motion.     Right lower leg: No edema.     Left lower leg: No edema.  Skin:    General: Skin is  warm and dry.  Neurological:     General: No focal deficit present.     Mental Status: He is alert and oriented to person, place, and time.     Cranial Nerves: No cranial nerve deficit.     Sensory: No sensory deficit.     Motor: No weakness.     Labs on Admission: I have personally reviewed following labs and imaging studies  CBC: Recent Labs  Lab 07/04/24 1202 07/04/24 1243  WBC 3.9*  --   NEUTROABS 2.1  --  HGB 15.8 13.9  HCT 45.4 41.0  MCV 91.5  --   PLT 160  --    Basic Metabolic Panel: Recent Labs  Lab 07/04/24 1202 07/04/24 1243 07/04/24 1702  NA 138 142  --   K 3.3* 3.4*  --   CL 101 103  --   CO2 27  --   --   GLUCOSE 120* 117*  --   BUN 19 20  --   CREATININE 1.08 1.10  --   CALCIUM  9.1  --   --   MG  --   --  2.3   GFR: Estimated Creatinine Clearance: 77.5 mL/min (by C-G formula based on SCr of 1.1 mg/dL). Liver Function Tests: Recent Labs  Lab 07/04/24 1202  AST 31  ALT 31  ALKPHOS 57  BILITOT 1.8*  PROT 6.8  ALBUMIN 4.3   No results for input(s): LIPASE, AMYLASE in the last 168 hours. No results for input(s): AMMONIA in the last 168 hours. Coagulation Profile: No results for input(s): INR, PROTIME in the last 168 hours. Cardiac Enzymes: No results for input(s): CKTOTAL, CKMB, CKMBINDEX, TROPONINI in the last 168 hours. BNP (last 3 results) No results for input(s): PROBNP in the last 8760 hours. HbA1C: No results for input(s): HGBA1C in the last 72 hours. CBG: No results for input(s): GLUCAP in the last 168 hours. Lipid Profile: No results for input(s): CHOL, HDL, LDLCALC, TRIG, CHOLHDL, LDLDIRECT in the last 72 hours. Thyroid  Function Tests: Recent Labs    07/04/24 1702  TSH 1.511   Anemia Panel: Recent Labs    07/04/24 1702  VITAMINB12 474  FOLATE >20.0   Urine analysis:    Component Value Date/Time   COLORURINE STRAW (A) 07/04/2024 1157   APPEARANCEUR CLEAR 07/04/2024 1157    LABSPEC 1.008 07/04/2024 1157   PHURINE 7.0 07/04/2024 1157   GLUCOSEU NEGATIVE 07/04/2024 1157   HGBUR NEGATIVE 07/04/2024 1157   BILIRUBINUR NEGATIVE 07/04/2024 1157   KETONESUR NEGATIVE 07/04/2024 1157   PROTEINUR NEGATIVE 07/04/2024 1157   UROBILINOGEN 0.2 07/15/2014 1218   NITRITE NEGATIVE 07/04/2024 1157   LEUKOCYTESUR NEGATIVE 07/04/2024 1157    Radiological Exams on Admission: MR BRAIN W WO CONTRAST Result Date: 07/04/2024 CLINICAL DATA:  Initial evaluation for acute mental status change, unknown cause. EXAM: MRI HEAD WITHOUT AND WITH CONTRAST TECHNIQUE: Multiplanar, multiecho pulse sequences of the brain and surrounding structures were obtained without and with intravenous contrast. CONTRAST:  9mL GADAVIST  GADOBUTROL  1 MMOL/ML IV SOLN COMPARISON:  Comparison made with prior MRI from 05/09/2022. FINDINGS: Brain: Cerebral volume within normal limits for age. No focal parenchymal signal abnormality. No abnormal foci of restricted diffusion to suggest acute or subacute ischemia. Gray-white matter differentiation well maintained. No encephalomalacia to suggest chronic cortical infarction or other insult. No acute intracranial hemorrhage. Two small chronic micro hemorrhages involving the central pons and left periatrial white matter noted (series 12, images 18, 36), of doubtful significance. No other chronic intracranial blood products. No mass lesion, midline shift or mass effect. Ventricles normal in size and morphology without hydrocephalus. No extra-axial fluid collection. Pituitary gland and suprasellar region within normal limits. No abnormal enhancement. Vascular: Major intracranial vascular flow voids are well maintained. Skull and upper cervical spine: Craniocervical junction within normal limits. Visualized upper cervical spine demonstrates no significant finding. Bone marrow signal intensity within normal limits. No scalp soft tissue abnormality. Sinuses/Orbits: Globes and orbital soft  tissues are within normal limits. Paranasal sinuses are largely clear. No significant mastoid effusion. Other:  None. IMPRESSION: Normal brain MRI for age. No acute intracranial abnormality identified. Electronically Signed   By: Morene Hoard M.D.   On: 07/04/2024 19:40   CT ANGIO HEAD NECK W WO CM Result Date: 07/04/2024 EXAM: CT HEAD WITHOUT CTA HEAD AND NECK WITH AND WITHOUT 07/04/2024 02:30:26 PM TECHNIQUE: CTA of the head and neck was performed with and without the administration of intravenous contrast. 75 mL (iohexol  (OMNIPAQUE ) 350 MG/ML injection 75 mL IOHEXOL  350 MG/ML SOLN) was administered. Noncontrast CT of the head with reconstructed 2-D images are also provided for review. Multiplanar 2D and/or 3D reformatted images are provided for review. Automated exposure control, iterative reconstruction, and/or weight based adjustment of the mA/kV was utilized to reduce the radiation dose to as low as reasonably achievable. COMPARISON: MR head without and with contrast 05/09/2022. CLINICAL HISTORY: Neuro deficit, acute, stroke suspected. Acute onset of memory problems. FINDINGS: CT HEAD: BRAIN AND VENTRICLES: No acute intracranial hemorrhage. No mass effect or midline shift. No extra-axial fluid collection. Gray-white differentiation is maintained. No hydrocephalus. ORBITS: No acute abnormality. SINUSES: No acute abnormality. SOFT TISSUES AND SKULL: No acute abnormality. CTA NECK: AORTIC ARCH AND ARCH VESSELS: No dissection or arterial injury. No significant stenosis of the brachiocephalic or subclavian arteries. CERVICAL CAROTID ARTERIES: Minimal atherosclerotic change is present in the proximal left ICA without significant stenosis relative to the more distal vessel. No dissection, arterial injury, or hemodynamically significant stenosis by NASCET criteria. CERVICAL VERTEBRAL ARTERIES: No dissection, arterial injury, or significant stenosis. VISUALIZED LUNGS AND MEDIASTINUM: Unremarkable. SOFT  TISSUES: No acute abnormality. BONES: Straightening of the normal cervical lordosis is present. Uncovertebral and facet hypertrophy results in moderate right foraminal stenosis at C3-C4. CTA HEAD: ANTERIOR CIRCULATION: No significant stenosis of the internal carotid arteries. No significant stenosis of the anterior cerebral arteries. No significant stenosis of the middle cerebral arteries. No aneurysm. POSTERIOR CIRCULATION: Fetal type posterior cerebral arteries are present bilaterally. The basilar artery terminates at the superior cerebellar arteries, a normal variant. No significant stenosis of the posterior cerebral arteries. No significant stenosis of the basilar artery. No significant stenosis of the vertebral arteries. No aneurysm. OTHER: No dural venous sinus thrombosis on this Gordon-dedicated study. IMPRESSION: 1. No acute intracranial hemorrhage. 2. No large vessel occlusion, hemodynamically significant stenosis, or aneurysm in the head or neck. 3. Minimal atherosclerotic change in the proximal left ICA without significant stenosis relative to the more distal vessel. Electronically signed by: Lonni Necessary MD 07/04/2024 02:47 PM EDT RP Workstation: [id number]    EKG: Independently reviewed.  Normal sinus rhythm, no significant change since previous tracing.  Assessment and Plan  Transient global amnesia CTA head and neck negative for acute intracranial hemorrhage or LVO.  Brain MRI showing no acute intracranial abnormality.  Neurology recommended admission for TIA versus less likely focal seizure workup. -Appreciate neurology recommendations -Frequent neurochecks per stroke unit protocol -TTE -Lipid panel, A1c -Aspirin 81 mg daily along with Plavix 75 mg daily x 21 days, followed by aspirin 81 mg daily alone. -Allow permissive hypertension for the first 24 hours with goal blood pressure <220/110.  Hold home antihypertensives. -Telemetry monitoring -Bedside swallow screen prior to p.o.  intake -Stroke education -PT/OT/SLP -Routine EEG  Mild hypokalemia Magnesium within normal range.  Replace potassium and monitor labs.  Hypertension Allowing permissive hypertension at this time as above.  Hyperlipidemia Continue Crestor  5 mg daily.  Home medication initiated per patient's reported history.  Awaiting pharmacy med rec for verification.   Essential tremor Holding propranolol  at  this time to allow permissive hypertension and also due to mild sinus bradycardia.  OSA Continue CPAP at night.  DVT prophylaxis: Lovenox Code Status: Full Code (discussed with the patient) Family Communication: Wife at bedside. Consults called: Neurology Level of care: Telemetry bed Admission status: It is my clinical opinion that referral for OBSERVATION is reasonable and necessary in this patient based on the above information provided. The aforementioned taken together are felt to place the patient at high risk for further clinical deterioration. However, it is anticipated that the patient may be medically stable for discharge from the hospital within 24 to 48 hours.  Editha Ram MD [facility]  If 7PM-7AM, please contact night-coverage [email or web address]  07/04/2024, 9:21 PM

## 2024-07-04 NOTE — ED Provider Notes (Signed)
  Physical Exam  BP 128/86   Pulse (!) 57   Temp (!) 97.4 F (36.3 C)   Resp 16   Ht 5' 9 (1.753 m)   Wt 90.7 kg   SpO2 99%   BMI 29.53 kg/m   Physical Exam Vitals and nursing note reviewed.  Constitutional:      General: He is not in acute distress.    Appearance: He is well-developed.  HENT:     Head: Normocephalic and atraumatic.  Eyes:     Conjunctiva/sclera: Conjunctivae normal.  Cardiovascular:     Rate and Rhythm: Normal rate and regular rhythm.     Heart sounds: No murmur heard. Pulmonary:     Effort: Pulmonary effort is normal. No respiratory distress.     Breath sounds: Normal breath sounds.  Abdominal:     Palpations: Abdomen is soft.     Tenderness: There is no abdominal tenderness.  Musculoskeletal:        General: No swelling.     Cervical back: Neck supple.  Skin:    General: Skin is warm and dry.     Capillary Refill: Capillary refill takes less than 2 seconds.  Neurological:     Mental Status: He is alert.  Psychiatric:        Mood and Affect: Mood normal.     Procedures  Procedures  ED Course / MDM   Clinical Course as of 07/04/24 2314  Sat Jul 04, 2024  1449 CBC metabolic panel unremarkable.  Urinalysis negative.  CT of head does not show any signs of acute hemorrhage.  Angiogram does not show any signs of stenosis or aneurysm [JK]  1504 Case discussed with Dr. Michaela.  Will proceed with MRI.  He will also see patient in consultation [JK]  2014 Medicine TIA TGFA  [TL]    Clinical Course User Index [JK] Randol Simmonds, MD [TL] Simon Lavonia SAILOR, MD   Medical Decision Making Amount and/or Complexity of Data Reviewed Radiology: ordered.  Risk OTC drugs. Prescription drug management. Decision regarding hospitalization.    Pateitn presents with episode of amnesia. Last known normal last night. Woke up this AM and wife noted patient had some amnesia. Could not remember how to log into comupter. Otherwise, no focal deficits.   Pending  neurology consult and MRI brain. CTA negative.   Neurology saw the patient.  Recommended admission for TIA workup.      Simon Lavonia SAILOR, MD 07/04/24 [phone]

## 2024-07-05 ENCOUNTER — Observation Stay (HOSPITAL_COMMUNITY)

## 2024-07-05 ENCOUNTER — Observation Stay (HOSPITAL_BASED_OUTPATIENT_CLINIC_OR_DEPARTMENT_OTHER)

## 2024-07-05 DIAGNOSIS — E785 Hyperlipidemia, unspecified: Secondary | ICD-10-CM

## 2024-07-05 DIAGNOSIS — G43109 Migraine with aura, not intractable, without status migrainosus: Secondary | ICD-10-CM | POA: Diagnosis not present

## 2024-07-05 DIAGNOSIS — R569 Unspecified convulsions: Secondary | ICD-10-CM | POA: Diagnosis not present

## 2024-07-05 DIAGNOSIS — G459 Transient cerebral ischemic attack, unspecified: Secondary | ICD-10-CM | POA: Diagnosis not present

## 2024-07-05 LAB — HEMOGLOBIN A1C
Hgb A1c MFr Bld: 5.4 % (ref 4.8–5.6)
Mean Plasma Glucose: 108.28 mg/dL

## 2024-07-05 LAB — ECHOCARDIOGRAM COMPLETE
AR max vel: 2.96 cm2
AV Peak grad: 9.2 mmHg
Ao pk vel: 1.52 m/s
Area-P 1/2: 4.68 cm2
Height: 69 in
S' Lateral: 3.3 cm
Weight: 3200 [oz_av]

## 2024-07-05 LAB — LIPID PANEL
Cholesterol: 150 mg/dL (ref 0–200)
HDL: 31 mg/dL — ABNORMAL LOW (ref >40)
LDL Cholesterol: 55 mg/dL (ref 0–99)
Total CHOL/HDL Ratio: 4.8 ratio
Triglycerides: 318 mg/dL — ABNORMAL HIGH (ref <150)
VLDL: 64 mg/dL — ABNORMAL HIGH (ref 0–40)

## 2024-07-05 LAB — HIV ANTIBODY (ROUTINE TESTING W REFLEX): HIV Screen 4th Generation wRfx: NONREACTIVE

## 2024-07-05 LAB — POTASSIUM: Potassium: 3.7 mmol/L (ref 3.5–5.1)

## 2024-07-05 LAB — RPR: RPR Ser Ql: NONREACTIVE

## 2024-07-05 MED ORDER — PROPRANOLOL HCL ER 60 MG PO CP24
60.0000 mg | ORAL_CAPSULE | Freq: Every day | ORAL | Status: DC
  Filled 2024-07-05: qty 1

## 2024-07-05 MED ORDER — ASPIRIN 81 MG PO TBEC: 81.0000 mg | DELAYED_RELEASE_TABLET | Freq: Every day | ORAL | Status: AC

## 2024-07-05 MED ORDER — ROSUVASTATIN CALCIUM 5 MG PO TABS: 5.0000 mg | ORAL_TABLET | Freq: Every day | ORAL | Status: DC

## 2024-07-05 NOTE — Progress Notes (Signed)
 Echocardiogram 2D Echocardiogram has been performed.  Tim Gordon 07/05/2024, 4:36 PM

## 2024-07-05 NOTE — Hospital Course (Addendum)
 Tim Gordon is a 63 y.o. male with medical history significant of hypertension, hyperlipidemia, migraines, essential tremor, OSA on CPAP presented to the ED for evaluation of amnesia.  LKW 11:30 PM yesterday night.    Upon waking up, patient was having difficulty remembering simple tasks, his computer password, and conversations that he recently had.  He was having a headache at that time as well.  No difficulty with speech or vision.  No focal weakness or numbness.  Denies history of previous stroke.  No seizure-like activity noted by family.  No other complaints.  Patient denies fevers, cough, shortness of breath, chest pain, vomiting, abdominal pain, diarrhea, or any urinary symptoms.  He was admitted for further workup.  CT angio head/neck were negative for acute findings and no LVO.  MRI brain was also negative for acute findings.  EEG was also obtained and also unremarkable.  The morning after admission patient was able to remember the events of the day prior when he was having amnesia.  Mentation had also returned back to his normal baseline. 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  Aspirin was started per neurology at discharge to continue.   Echo was performed but read pending at time of discharge and will need to be followed up at outpatient follow-up visit.

## 2024-07-05 NOTE — Progress Notes (Signed)
 EEG complete - results pending

## 2024-07-05 NOTE — Discharge Summary (Signed)
 Physician Discharge Summary   Tim Gordon FMW:[id number] DOB: 1961-02-17 DOA: 07/04/2024  PCP: Alvia Bring, DO  Admit date: 07/04/2024 Discharge date: 07/05/2024   Admitted From: Home Disposition:  Home Discharging physician: Alm Apo, MD Barriers to discharge: none  Recommendations at discharge: Follow up echo report   Discharge Condition: stable CODE STATUS: Full  Diet recommendation:  Diet Orders (From admission, onward)     Start     Ordered   07/05/24 0941  Diet regular Fluid consistency: Thin  Diet effective now       Question:  Fluid consistency:  Answer:  Thin   07/05/24 0940   07/05/24 0000  Diet general        07/05/24 1532            Hospital Course: Tim Gordon is a 63 y.o. male with medical history significant of hypertension, hyperlipidemia, migraines, essential tremor, OSA on CPAP presented to the ED for evaluation of amnesia.  LKW 11:30 PM yesterday night.    Upon waking up, patient was having difficulty remembering simple tasks, his computer password, and conversations that he recently had.  He was having a headache at that time as well.  No difficulty with speech or vision.  No focal weakness or numbness.  Denies history of previous stroke.  No seizure-like activity noted by family.  No other complaints.  Patient denies fevers, cough, shortness of breath, chest pain, vomiting, abdominal pain, diarrhea, or any urinary symptoms.  He was admitted for further workup.  CT angio head/neck were negative for acute findings and no LVO.  MRI brain was also negative for acute findings.  EEG was also obtained and also unremarkable.  The morning after admission patient was able to remember the events of the day prior when he was having amnesia.  Mentation had also returned back to his normal baseline. After evaluation by neurology, his symptoms were felt to be due to suspected atypical complicated migraine rather than TIA or TGA as suspected on  admission.  Aspirin was started per neurology at discharge to continue.   Echo was performed but read pending at time of discharge and will need to be followed up at outpatient follow-up visit.  Assessment and Plan: No notes have been filed under this hospital service. Service: Hospitalist      The patient's acute and chronic medical conditions were treated accordingly. On day of discharge, patient was felt deemed stable for discharge. Patient/family member advised to call PCP or come back to ER if needed.   Principal Diagnosis: Complicated migraine  Discharge Diagnoses: Active Hospital Problems   Diagnosis Date Noted   Complicated migraine 07/04/2024    Priority: 1.   Hypokalemia 07/04/2024   Essential hypertension 08/22/2017   OSA (obstructive sleep apnea) 08/22/2017   Hyperlipidemia 06/26/2012    Resolved Hospital Problems  No resolved problems to display.     Discharge Instructions     Diet general   Complete by: As directed    Increase activity slowly   Complete by: As directed       Allergies as of 07/05/2024       Reactions   Flomax  [tamsulosin ]    Dizziness, felt funny   Biaxin [clarithromycin] Diarrhea        Medication List     TAKE these medications    aspirin EC 81 MG tablet Take 1 tablet (81 mg total) by mouth daily. Swallow whole. Start taking on: July 06, 2024   hydrochlorothiazide  25  MG tablet Commonly known as: HYDRODIURIL  Take 1 tablet (25 mg total) by mouth every morning.   losartan  50 MG tablet Commonly known as: COZAAR  Take 1 tablet (50 mg total) by mouth daily.   MAGNESIUM CITRATE PO Take 2 tablets by mouth at bedtime.   multivitamin with minerals Tabs tablet Take 1 tablet by mouth daily.   propranolol  ER 60 MG 24 hr capsule Commonly known as: INDERAL  LA Take 1 capsule (60 mg total) by mouth daily.   rosuvastatin  5 MG tablet Commonly known as: CRESTOR  Take 1 tablet (5 mg total) by mouth daily.         Allergies  Allergen Reactions   Flomax  [Tamsulosin ]     Dizziness, felt funny   Biaxin [Clarithromycin] Diarrhea    Consultations: Neurology  Procedures:   Discharge Exam: BP (!) 140/89 (BP Location: Left Arm)   Pulse 69   Temp 98 F (36.7 C) (Oral)   Resp 19   Ht 5' 9 (1.753 m)   Wt 90.7 kg   SpO2 97%   BMI 29.53 kg/m  Physical Exam Constitutional:      General: He is not in acute distress.    Appearance: Normal appearance.  HENT:     Head: Normocephalic and atraumatic.     Mouth/Throat:     Mouth: Mucous membranes are moist.  Eyes:     Extraocular Movements: Extraocular movements intact.  Cardiovascular:     Rate and Rhythm: Normal rate and regular rhythm.  Pulmonary:     Effort: Pulmonary effort is normal. No respiratory distress.     Breath sounds: Normal breath sounds. No wheezing.  Abdominal:     General: Bowel sounds are normal. There is no distension.     Palpations: Abdomen is soft.     Tenderness: There is no abdominal tenderness.  Musculoskeletal:        General: Normal range of motion.     Cervical back: Normal range of motion and neck supple.  Skin:    General: Skin is warm and dry.  Neurological:     General: No focal deficit present.     Mental Status: He is alert.  Psychiatric:        Mood and Affect: Mood normal.        Behavior: Behavior normal.      The results of significant diagnostics from this hospitalization (including imaging, microbiology, ancillary and laboratory) are listed below for reference.   Microbiology: No results found for this or any previous visit (from the past 240 hours).   Labs: BNP (last 3 results) No results for input(s): BNP in the last 8760 hours. Basic Metabolic Panel: Recent Labs  Lab 07/04/24 1202 07/04/24 1243 07/04/24 1702 07/05/24 0405  NA 138 142  --   --   K 3.3* 3.4*  --  3.7  CL 101 103  --   --   CO2 27  --   --   --   GLUCOSE 120* 117*  --   --   BUN 19 20  --   --    CREATININE 1.08 1.10  --   --   CALCIUM  9.1  --   --   --   MG  --   --  2.3  --    Liver Function Tests: Recent Labs  Lab 07/04/24 1202  AST 31  ALT 31  ALKPHOS 57  BILITOT 1.8*  PROT 6.8  ALBUMIN 4.3   No results for input(s): LIPASE,  AMYLASE in the last 168 hours. No results for input(s): AMMONIA in the last 168 hours. CBC: Recent Labs  Lab 07/04/24 1202 07/04/24 1243  WBC 3.9*  --   NEUTROABS 2.1  --   HGB 15.8 13.9  HCT 45.4 41.0  MCV 91.5  --   PLT 160  --    Cardiac Enzymes: No results for input(s): CKTOTAL, CKMB, CKMBINDEX, TROPONINI in the last 168 hours. BNP: Invalid input(s): POCBNP CBG: No results for input(s): GLUCAP in the last 168 hours. D-Dimer No results for input(s): DDIMER in the last 72 hours. Hgb A1c Recent Labs    07/05/24 0353  HGBA1C 5.4   Lipid Profile Recent Labs    07/05/24 0405  CHOL 150  HDL 31*  LDLCALC 55  TRIG 681*  CHOLHDL 4.8   Thyroid  function studies Recent Labs    07/04/24 1702  TSH 1.511   Anemia work up Recent Labs    07/04/24 1702  VITAMINB12 474  FOLATE >20.0   Urinalysis    Component Value Date/Time   COLORURINE STRAW (A) 07/04/2024 1157   APPEARANCEUR CLEAR 07/04/2024 1157   LABSPEC 1.008 07/04/2024 1157   PHURINE 7.0 07/04/2024 1157   GLUCOSEU NEGATIVE 07/04/2024 1157   HGBUR NEGATIVE 07/04/2024 1157   BILIRUBINUR NEGATIVE 07/04/2024 1157   KETONESUR NEGATIVE 07/04/2024 1157   PROTEINUR NEGATIVE 07/04/2024 1157   UROBILINOGEN 0.2 07/15/2014 1218   NITRITE NEGATIVE 07/04/2024 1157   LEUKOCYTESUR NEGATIVE 07/04/2024 1157   Sepsis Labs Recent Labs  Lab 07/04/24 1202  WBC 3.9*   Microbiology No results found for this or any previous visit (from the past 240 hours).  Procedures/Studies: EEG adult Result Date: 07/05/2024 Matthews Elida HERO, MD     07/05/2024 11:08 AM Routine EEG Report Tim Gordon is a 63 y.o. male with a history of transient global amnesia who is  undergoing an EEG to evaluate for seizures. Report: This EEG was acquired with electrodes placed according to the International 10-20 electrode system (including Fp1, Fp2, F3, F4, C3, C4, P3, P4, O1, O2, T3, T4, T5, T6, A1, A2, Fz, Cz, Pz). The following electrodes were missing or displaced: none. The occipital dominant rhythm was 11 Hz. This activity is reactive to stimulation. Drowsiness was manifested by background fragmentation; deeper stages of sleep were not identified. There was no focal slowing. There were no interictal epileptiform discharges. There were no electrographic seizures identified. There was no abnormal response to photic stimulation or hyperventilation. Impression: This EEG was obtained while awake and drowsy and is normal.   Clinical Correlation: Normal EEGs, however, do not rule out epilepsy. Elida Matthews, MD [facility] [phone] If 7pm- 7am, please page neurology on call as listed in AMION.   MR BRAIN W WO CONTRAST Result Date: 07/04/2024 CLINICAL DATA:  Initial evaluation for acute mental status change, unknown cause. EXAM: MRI HEAD WITHOUT AND WITH CONTRAST TECHNIQUE: Multiplanar, multiecho pulse sequences of the brain and surrounding structures were obtained without and with intravenous contrast. CONTRAST:  9mL GADAVIST  GADOBUTROL  1 MMOL/ML IV SOLN COMPARISON:  Comparison made with prior MRI from 05/09/2022. FINDINGS: Brain: Cerebral volume within normal limits for age. No focal parenchymal signal abnormality. No abnormal foci of restricted diffusion to suggest acute or subacute ischemia. Gray-white matter differentiation well maintained. No encephalomalacia to suggest chronic cortical infarction or other insult. No acute intracranial hemorrhage. Two small chronic micro hemorrhages involving the central pons and left periatrial white matter noted (series 12, images 18, 36), of doubtful significance.  No other chronic intracranial blood products. No mass lesion, midline  shift or mass effect. Ventricles normal in size and morphology without hydrocephalus. No extra-axial fluid collection. Pituitary gland and suprasellar region within normal limits. No abnormal enhancement. Vascular: Major intracranial vascular flow voids are well maintained. Skull and upper cervical spine: Craniocervical junction within normal limits. Visualized upper cervical spine demonstrates no significant finding. Bone marrow signal intensity within normal limits. No scalp soft tissue abnormality. Sinuses/Orbits: Globes and orbital soft tissues are within normal limits. Paranasal sinuses are largely clear. No significant mastoid effusion. Other: None. IMPRESSION: Normal brain MRI for age. No acute intracranial abnormality identified. Electronically Signed   By: Morene Hoard M.D.   On: 07/04/2024 19:40   CT ANGIO HEAD NECK W WO CM Result Date: 07/04/2024 EXAM: CT HEAD WITHOUT CTA HEAD AND NECK WITH AND WITHOUT 07/04/2024 02:30:26 PM TECHNIQUE: CTA of the head and neck was performed with and without the administration of intravenous contrast. 75 mL (iohexol  (OMNIPAQUE ) 350 MG/ML injection 75 mL IOHEXOL  350 MG/ML SOLN) was administered. Noncontrast CT of the head with reconstructed 2-D images are also provided for review. Multiplanar 2D and/or 3D reformatted images are provided for review. Automated exposure control, iterative reconstruction, and/or weight based adjustment of the mA/kV was utilized to reduce the radiation dose to as low as reasonably achievable. COMPARISON: MR head without and with contrast 05/09/2022. CLINICAL HISTORY: Neuro deficit, acute, stroke suspected. Acute onset of memory problems. FINDINGS: CT HEAD: BRAIN AND VENTRICLES: No acute intracranial hemorrhage. No mass effect or midline shift. No extra-axial fluid collection. Gray-white differentiation is maintained. No hydrocephalus. ORBITS: No acute abnormality. SINUSES: No acute abnormality. SOFT TISSUES AND SKULL: No acute  abnormality. CTA NECK: AORTIC ARCH AND ARCH VESSELS: No dissection or arterial injury. No significant stenosis of the brachiocephalic or subclavian arteries. CERVICAL CAROTID ARTERIES: Minimal atherosclerotic change is present in the proximal left ICA without significant stenosis relative to the more distal vessel. No dissection, arterial injury, or hemodynamically significant stenosis by NASCET criteria. CERVICAL VERTEBRAL ARTERIES: No dissection, arterial injury, or significant stenosis. VISUALIZED LUNGS AND MEDIASTINUM: Unremarkable. SOFT TISSUES: No acute abnormality. BONES: Straightening of the normal cervical lordosis is present. Uncovertebral and facet hypertrophy results in moderate right foraminal stenosis at C3-C4. CTA HEAD: ANTERIOR CIRCULATION: No significant stenosis of the internal carotid arteries. No significant stenosis of the anterior cerebral arteries. No significant stenosis of the middle cerebral arteries. No aneurysm. POSTERIOR CIRCULATION: Fetal type posterior cerebral arteries are present bilaterally. The basilar artery terminates at the superior cerebellar arteries, a normal variant. No significant stenosis of the posterior cerebral arteries. No significant stenosis of the basilar artery. No significant stenosis of the vertebral arteries. No aneurysm. OTHER: No dural venous sinus thrombosis on this non-dedicated study. IMPRESSION: 1. No acute intracranial hemorrhage. 2. No large vessel occlusion, hemodynamically significant stenosis, or aneurysm in the head or neck. 3. Minimal atherosclerotic change in the proximal left ICA without significant stenosis relative to the more distal vessel. Electronically signed by: Lonni Necessary MD 07/04/2024 02:47 PM EDT RP Workstation: [id number]     Time coordinating discharge: Over 30 minutes    Alm Apo, MD  [facility] 07/05/2024, 3:32 PM

## 2024-07-05 NOTE — Progress Notes (Signed)
 PT Cancellation Note  Patient Details Name: Tim Gordon MRN: [id number] DOB: 28-Dec-1960   Cancelled Treatment:    Reason Eval/Treat Not Completed: PT screened, no needs identified, will sign off. Pt denies any symptoms involving mobility, strength, balance. Pt reports he feels symptoms related to memory and cognition have subsided. Pt has been mobilizing independently since admission to the ED and denies mobility concerns currently. Pt does not appear to have PT needs at this time, PT signing off.   [name] 07/05/2024, 11:15 AM

## 2024-07-05 NOTE — Progress Notes (Signed)
 SLP Cancellation Note  Patient Details Name: Tim Gordon MRN: [id number] DOB: 1961-03-18   Cancelled treatment:        Signed d/c orders have been placed before the opportunity to evaluate this patient. Per other rehab notes, he has made a full return to baseline. SLP signing off.  Manuelita Blew M.S. CCC-SLP

## 2024-07-05 NOTE — Procedures (Signed)
 Routine EEG Report  Tim Gordon is a 63 y.o. male with a history of transient global amnesia who is undergoing an EEG to evaluate for seizures.  Report: This EEG was acquired with electrodes placed according to the International 10-20 electrode system (including Fp1, Fp2, F3, F4, C3, C4, P3, P4, O1, O2, T3, T4, T5, T6, A1, A2, Fz, Cz, Pz). The following electrodes were missing or displaced: none.  The occipital dominant rhythm was 11 Hz. This activity is reactive to stimulation. Drowsiness was manifested by background fragmentation; deeper stages of sleep were not identified. There was no focal slowing. There were no interictal epileptiform discharges. There were no electrographic seizures identified. There was no abnormal response to photic stimulation or hyperventilation.   Impression: This EEG was obtained while awake and drowsy and is normal.    Clinical Correlation: Normal EEGs, however, do not rule out epilepsy.  [name], MD [facility] [phone]  If 7pm- 7am, please page neurology on call as listed in AMION.

## 2024-07-05 NOTE — ED Notes (Signed)
 Per off-going nurse, patient wants to take his own prescription medications rather than the medications provided by the hospital. Per [place] RN, MD made aware

## 2024-07-05 NOTE — ED Notes (Signed)
Breakfast tray ordered for the patient.  

## 2024-07-05 NOTE — Progress Notes (Signed)
 STROKE TEAM PROGRESS NOTE    INTERIM HISTORY/SUBJECTIVE  Patient has remained hemodynamically stable and afebrile overnight.  He has had no further memory disturbances and has a clear recall of most of yesterday's events.  OBJECTIVE  CBC    Component Value Date/Time   WBC 3.9 (L) 07/04/2024 1202   RBC 4.96 07/04/2024 1202   HGB 13.9 07/04/2024 1243   HGB 15.8 12/30/2023 1037   HCT 41.0 07/04/2024 1243   HCT 46.2 12/30/2023 1037   PLT 160 07/04/2024 1202   PLT 182 12/30/2023 1037   MCV 91.5 07/04/2024 1202   MCV 92 12/30/2023 1037   MCH 31.9 07/04/2024 1202   MCHC 34.8 07/04/2024 1202   RDW 12.7 07/04/2024 1202   RDW 12.6 12/30/2023 1037   LYMPHSABS 1.2 07/04/2024 1202   LYMPHSABS 1.4 12/30/2023 1037   MONOABS 0.5 07/04/2024 1202   EOSABS 0.1 07/04/2024 1202   EOSABS 0.1 12/30/2023 1037   BASOSABS 0.0 07/04/2024 1202   BASOSABS 0.0 12/30/2023 1037    BMET    Component Value Date/Time   NA 142 07/04/2024 1243   NA 143 12/30/2023 1037   K 3.7 07/05/2024 0405   CL 103 07/04/2024 1243   CO2 27 07/04/2024 1202   GLUCOSE 117 (H) 07/04/2024 1243   BUN 20 07/04/2024 1243   BUN 16 12/30/2023 1037   CREATININE 1.10 07/04/2024 1243   CREATININE 1.22 05/07/2022 0000   CALCIUM  9.1 07/04/2024 1202   EGFR 62 12/30/2023 1037   GFRNONAA >60 07/04/2024 1202    IMAGING past 24 hours EEG adult Result Date: 07/05/2024 Matthews Elida HERO, MD     07/05/2024 11:08 AM Routine EEG Report Tim Gordon is a 63 y.o. male with a history of transient global amnesia who is undergoing an EEG to evaluate for seizures. Report: This EEG was acquired with electrodes placed according to the International 10-20 electrode system (including Fp1, Fp2, F3, F4, C3, C4, P3, P4, O1, O2, T3, T4, T5, T6, A1, A2, Fz, Cz, Pz). The following electrodes were missing or displaced: none. The occipital dominant rhythm was 11 Hz. This activity is reactive to stimulation. Drowsiness was manifested by background  fragmentation; deeper stages of sleep were not identified. There was no focal slowing. There were no interictal epileptiform discharges. There were no electrographic seizures identified. There was no abnormal response to photic stimulation or hyperventilation. Impression: This EEG was obtained while awake and drowsy and is normal.   Clinical Correlation: Normal EEGs, however, do not rule out epilepsy. Elida Matthews, MD [facility] [phone] If 7pm- 7am, please page neurology on call as listed in AMION.   MR BRAIN W WO CONTRAST Result Date: 07/04/2024 CLINICAL DATA:  Initial evaluation for acute mental status change, unknown cause. EXAM: MRI HEAD WITHOUT AND WITH CONTRAST TECHNIQUE: Multiplanar, multiecho pulse sequences of the brain and surrounding structures were obtained without and with intravenous contrast. CONTRAST:  9mL GADAVIST  GADOBUTROL  1 MMOL/ML IV SOLN COMPARISON:  Comparison made with prior MRI from 05/09/2022. FINDINGS: Brain: Cerebral volume within normal limits for age. No focal parenchymal signal abnormality. No abnormal foci of restricted diffusion to suggest acute or subacute ischemia. Gray-white matter differentiation well maintained. No encephalomalacia to suggest chronic cortical infarction or other insult. No acute intracranial hemorrhage. Two small chronic micro hemorrhages involving the central pons and left periatrial white matter noted (series 12, images 18, 36), of doubtful significance. No other chronic intracranial blood products. No mass lesion, midline shift or mass effect. Ventricles normal  in size and morphology without hydrocephalus. No extra-axial fluid collection. Pituitary gland and suprasellar region within normal limits. No abnormal enhancement. Vascular: Major intracranial vascular flow voids are well maintained. Skull and upper cervical spine: Craniocervical junction within normal limits. Visualized upper cervical spine demonstrates no significant finding.  Bone marrow signal intensity within normal limits. No scalp soft tissue abnormality. Sinuses/Orbits: Globes and orbital soft tissues are within normal limits. Paranasal sinuses are largely clear. No significant mastoid effusion. Other: None. IMPRESSION: Normal brain MRI for age. No acute intracranial abnormality identified. Electronically Signed   By: Morene Hoard M.D.   On: 07/04/2024 19:40   CT ANGIO HEAD NECK W WO CM Result Date: 07/04/2024 EXAM: CT HEAD WITHOUT CTA HEAD AND NECK WITH AND WITHOUT 07/04/2024 02:30:26 PM TECHNIQUE: CTA of the head and neck was performed with and without the administration of intravenous contrast. 75 mL (iohexol  (OMNIPAQUE ) 350 MG/ML injection 75 mL IOHEXOL  350 MG/ML SOLN) was administered. Noncontrast CT of the head with reconstructed 2-D images are also provided for review. Multiplanar 2D and/or 3D reformatted images are provided for review. Automated exposure control, iterative reconstruction, and/or weight based adjustment of the mA/kV was utilized to reduce the radiation dose to as low as reasonably achievable. COMPARISON: MR head without and with contrast 05/09/2022. CLINICAL HISTORY: Neuro deficit, acute, stroke suspected. Acute onset of memory problems. FINDINGS: CT HEAD: BRAIN AND VENTRICLES: No acute intracranial hemorrhage. No mass effect or midline shift. No extra-axial fluid collection. Gray-white differentiation is maintained. No hydrocephalus. ORBITS: No acute abnormality. SINUSES: No acute abnormality. SOFT TISSUES AND SKULL: No acute abnormality. CTA NECK: AORTIC ARCH AND ARCH VESSELS: No dissection or arterial injury. No significant stenosis of the brachiocephalic or subclavian arteries. CERVICAL CAROTID ARTERIES: Minimal atherosclerotic change is present in the proximal left ICA without significant stenosis relative to the more distal vessel. No dissection, arterial injury, or hemodynamically significant stenosis by NASCET criteria. CERVICAL VERTEBRAL  ARTERIES: No dissection, arterial injury, or significant stenosis. VISUALIZED LUNGS AND MEDIASTINUM: Unremarkable. SOFT TISSUES: No acute abnormality. BONES: Straightening of the normal cervical lordosis is present. Uncovertebral and facet hypertrophy results in moderate right foraminal stenosis at C3-C4. CTA HEAD: ANTERIOR CIRCULATION: No significant stenosis of the internal carotid arteries. No significant stenosis of the anterior cerebral arteries. No significant stenosis of the middle cerebral arteries. No aneurysm. POSTERIOR CIRCULATION: Fetal type posterior cerebral arteries are present bilaterally. The basilar artery terminates at the superior cerebellar arteries, a normal variant. No significant stenosis of the posterior cerebral arteries. No significant stenosis of the basilar artery. No significant stenosis of the vertebral arteries. No aneurysm. OTHER: No dural venous sinus thrombosis on this non-dedicated study. IMPRESSION: 1. No acute intracranial hemorrhage. 2. No large vessel occlusion, hemodynamically significant stenosis, or aneurysm in the head or neck. 3. Minimal atherosclerotic change in the proximal left ICA without significant stenosis relative to the more distal vessel. Electronically signed by: Lonni Necessary MD 07/04/2024 02:47 PM EDT RP Workstation: [id number]    Vitals:   07/05/24 0700 07/05/24 0714 07/05/24 1019 07/05/24 1303  BP:  107/76 (!) 130/109 (!) 140/89  Pulse: (!) 56 (!) 55 66 69  Resp: 12 (!) 21 17 19   Temp:  97.9 F (36.6 C) 97.8 F (36.6 C) 98 F (36.7 C)  TempSrc:  Oral Oral Oral  SpO2: 100% 98% 100% 97%  Weight:      Height:         PHYSICAL EXAM General:  Alert, well-nourished, well-developed patient in no acute  distress Psych:  Mood and affect appropriate for situation CV: Regular rate and rhythm on monitor Respiratory:  Regular, unlabored respirations on room air   NEURO:  Mental Status: AA&Ox3, patient is able to give clear and coherent  history Speech/Language: speech is without dysarthria or aphasia.    Cranial Nerves:  II: PERRL. Visual fields full.  III, IV, VI: EOMI. Eyelids elevate symmetrically.  V: Sensation is intact to light touch and symmetrical to face.  VII: Face is symmetrical resting and smiling VIII: hearing intact to voice. IX, X: Phonation is normal.  KP:Dynloizm shrug 5/5. XII: tongue is midline without fasciculations. Motor: 5/5 strength to all muscle groups tested.  Tone: is normal and bulk is normal Sensation- Intact to light touch bilaterally.  Coordination: FTN intact bilaterally,  Gait- deferred  Most Recent NIH 0  ASSESSMENT/PLAN  Mr. Tim Gordon is a 63 y.o. male with history of ocular migraines, hypertension, hyperlipidemia, sleep apnea, essential tremor and diverticulitis admitted for an acute onset memory disturbance with repetitive questions, inability to remember common facts such as the current month and a mild headache.  Patient states that he had typical migraines with a severe headache when he was younger but that these had stopped after he reached the age of 63.  He does have occasional ocular migraines without headache.  He reports that yesterday he woke up with a 2/10 frontal headache and noted that he was unable to remember his computer password.  He mentioned this to his wife and apparently mentioned it several times without recalling that he had done so previously.  He was noted to be unable to recall common facts such as who the president was or what the current month was.  His wife brought him to the emergency department.  He does have clear memory of most of yesterday's events, which would be atypical for transient global amnesia.  Given isolated cognitive symptoms, TIA is also less likely.  Most likely etiology of patient's symptoms is atypical complicated migraine, given his long history of migraine headaches and headache accompanying memory symptoms yesterday.    Strokelike  symptoms due to complicated migraine CT head No acute abnormality.  CTA head & neck no LVO or hemodynamically significant stenosis MRI no acute abnormality 2D Echo pending LDL 55 HgbA1c 5.4 VTE prophylaxis -Lovenox No antithrombotic prior to admission, now on aspirin 81 mg daily  Therapy recommendations:  Pending Disposition: Pending, likely home  Hypertension Home meds: Losartan  50 mg daily, hydrochlorothiazide  25 mg daily Stable Maintain normotension  Hyperlipidemia Home meds: Rosuvastatin  5 mg daily, resumed in hospital LDL 55, goal < 70 High intensity statin not indicated as LDL below goal Continue statin at discharge  Other Stroke Risk Factors Migraines   Other Active Problems None  Hospital day # 0  Patient seen by NP with MD, MD to edit note as needed. Alois Mincer E Everitt Clint Kill , MSN, AGACNP-BC [facility] See Amion for schedule and pager information 07/05/2024 2:13 PM    To contact Stroke Continuity provider, please refer to [email or web address]. After hours, contact General Neurology

## 2024-07-06 DIAGNOSIS — G4733 Obstructive sleep apnea (adult) (pediatric): Secondary | ICD-10-CM | POA: Diagnosis not present

## 2024-07-07 LAB — VITAMIN B1: Vitamin B1 (Thiamine): 113.2 nmol/L (ref 66.5–200.0)

## 2024-07-09 ENCOUNTER — Ambulatory Visit: Admitting: Neurology

## 2024-07-09 ENCOUNTER — Encounter: Payer: Self-pay | Admitting: Neurology

## 2024-07-09 VITALS — BP 123/74 | HR 60 | Ht 69.0 in | Wt 201.0 lb

## 2024-07-09 DIAGNOSIS — R29818 Other symptoms and signs involving the nervous system: Secondary | ICD-10-CM | POA: Diagnosis not present

## 2024-07-09 DIAGNOSIS — G43109 Migraine with aura, not intractable, without status migrainosus: Secondary | ICD-10-CM

## 2024-07-09 DIAGNOSIS — G4733 Obstructive sleep apnea (adult) (pediatric): Secondary | ICD-10-CM

## 2024-07-09 NOTE — Progress Notes (Signed)
 Subjective:    Patient ID: Tim Gordon is a 63 y.o. male.  HPI    Interim history:   Tim Gordon is a 63 year old right-handed gentleman with an underlying medical history of reflux disease, kidney stones, migraine headaches, ocular migraines, nonarteritic AION of the left eye, hyperlipidemia, allergies, essential tremor, diverticulitis, dizziness, sleep apnea, history of transient global amnesia, and overweight state, who presents for a new problem visit after a recent hospitalization, concerning for transient global amnesia versus complicated migraine.  The patient is accompanied by his wife again and presents after a longer gap of over 1 year.  Tim Gordon was last seen in our clinic in June 2024, at which time Tim Gordon was compliant with his AutoPap.  Tim Gordon had memory complaints and I made a referral to neuropsychology.    Today, 07/09/2024: Tim Gordon reports still not sleeping well.  Tim Gordon has nocturia about 2-3 times per average night.  Years ago Tim Gordon tried Flomax  but had a reaction to it.  Tim Gordon tries to make enough time for sleep.  Tim Gordon has no trouble falling asleep.  Tim Gordon tried melatonin in the past which did not help.  His wife feels that his memory is not as good but overall Tim Gordon is much better than when Tim Gordon was in the hospital.  Tim Gordon does have a lingering mild headache.  Tim Gordon has a history of migraines, including migraine with aura.  Tim Gordon denies any one-sided symptoms at this time.   Tim Gordon tries to limit his water intake before bedtime so Tim Gordon does not have to get up as many times but still has to get up at least once or twice.  Tim Gordon drinks caffeine in the morning in the form of coffee and a soda in the afternoon, as late as 4 PM but timing is variable for this.  His wife wonders if work related stress is also a contributor to what led to the hospital admission.  Tim Gordon is due for his eye exam for this year.  Tim Gordon had an episode of amnesia about 2 years ago per wife.  I reviewed his AutoPap compliance data from 06/08/2024 through 07/07/2024, which  is a total of 30 days, during which time Tim Gordon used his machine every night with percent use days greater than 4 hours at 100%, indicating superb compliance with an average usage of 7 hours and 20 minutes, residual AHI at goal at 0.9/h, 95th percentile of pressure at 7.2 cm with a range of 5 to 10 cm with EPR of 3.  Leak on the low side with a 95th percentile at 0 L/min.  Tim Gordon was hospitalized in late September from 07/04/2024 through 07/05/2024 with an episode of amnesia.  Tim Gordon presented to the emergency room with difficulty remembering simple tasks including his computer password and recent conversations.  Tim Gordon reported a headache at the time.  Tim Gordon did not have any focal weakness or numbness.  Symptoms started upon awakening.  I reviewed the emergency room and hospital records.  Tim Gordon had extensive workup.  Tim Gordon had a CT angiogram of the head and neck with and without contrast on 07/04/2024 and I reviewed the results:  IMPRESSION: 1. No acute intracranial hemorrhage. 2. No large vessel occlusion, hemodynamically significant stenosis, or aneurysm in the head or neck. 3. Minimal atherosclerotic change in the proximal left ICA without significant stenosis relative to the more distal vessel.    Tim Gordon had a brain MRI with and without contrast on 07/04/2024 and I reviewed the results:  IMPRESSION: Normal brain MRI  for age. No acute intracranial abnormality identified.   In addition, I personally and independently reviewed images through the PACS system.  Tim Gordon had an EEG on 07/05/2024 and I reviewed the results:  Impression: This EEG was obtained while awake and drowsy and is normal.    Clinical Correlation: Normal EEGs, however, do not rule out epilepsy.    Tim Gordon had an echocardiogram on 07/05/2024 and I reviewed the results: EF was 60 to 65% with normal LV function.  No regional wall motion abnormality was noted.  Tim Gordon had trivial mitral valve regurgitation, no mitral stenosis, no evidence of aortic stenosis.  There was mild  dilatation of the ascending aorta measuring 39 mm.  There was mild aortic valve regurgitation.  Tim Gordon had extensive laboratory testing as well.  CRP was 1.0, borderline, vitamin B1 was normal at 113.2.  Magnesium level was normal, TSH normal at 1.51, vitamin B12 474, ESR normal at 1.  RPR was nonreactive, and so was HIV.  Overall, Tim Gordon was felt to have a migraine headache rather than classic transient global amnesia.  Tim Gordon was not felt to have classic TIA.  Tim Gordon did not end up with neuropsychological evaluation.  Previously:   03/21/2023: I last saw Tim Gordon on 03/27/2022 for a sooner than scheduled visit due to visual disturbance bilaterally.  Tim Gordon had gone to the emergency room.  His exam was benign.  Tim Gordon was advised to continue with propranolol  for his essential tremor and with his AutoPap machine.  Tim Gordon had an interim consultation with neuro-ophthalmology in September 2023 and a follow-up in November 2023.  Tim Gordon saw Dr. Evalene Lunger through [facility].   I reviewed his AutoPap compliance data from 02/17/2023 through 03/18/2023, which is a total of 30 days, during which time Tim Gordon used his machine every night with percent use days greater than 4 hours at 97%, indicating excellent compliance with an average usage of 6 hours and 29 minutes, residual AHI at goal at 1.4/h, 95th percentile of pressure at 6.9 cm with a range of 5 to 10 cm with EPR of 3.  Leak on the low side with the 95th percentile at 0.5 L/min.  Tim Gordon reports not sleeping well currently.  Tim Gordon has trouble staying asleep.  Tim Gordon has not tried melatonin recently but it did not seem to help in the past.  Tim Gordon tolerates the mask and the pressure but humidity seems to be high at times it makes the mask too moist.  Tim Gordon has adjusted the moisture level a little bit.  Tim Gordon is willing to tweak the moisture level down even more.  Tim Gordon has had 2 ocular migraines recently.  His wife is worried about his forgetfulness which has been more noticeable in the past 6 months.   His B12 level was checked recently, it was 527 on 11/30/2022.  His cholesterol panel showed elevated triglycerides at 421.  Tim Gordon had a normal PSA at the time.  Tim Gordon has had nocturia.  Tim Gordon has seen urology, Tim Gordon tried Flomax  but it caused side effects.  Tim Gordon has an appointment pending for August. Tim Gordon does endorse stress, especially what with his youngest daughter being in the hospital currently.   I had ordered a brain MRI with and without contrast as well as orbital MRI with and without contrast last year.  Tim Gordon had his scans on 05/09/2022 and I reviewed the results:  IMPRESSION: This is a normal MRI of the brain with and without contrast.   IMPRESSION: This is a normal  MRI of the orbits with and without contrast.       His tremor is under good control with propranolol  long-acting 60 mg daily.   Tim Gordon has not talked to his primary care about stress and anxiety management, Tim Gordon has not been on any antidepressant medication or anxiety medication recently, years ago Tim Gordon was on clonazepam  but weaned himself off of it.   Tim Gordon was diagnosed with pneumonia recently, Tim Gordon is finishing antibiotics, Tim Gordon has finished his prednisone  course.   Tim Gordon hydrates well with water.  His GI specialist wants Tim Gordon to drink at least 80 ounces of water per day.      I saw Tim Gordon on 03/06/2022 for sleep apnea follow-up, at which time Tim Gordon was compliant with his AutoPap.  Tim Gordon had some adjustment issues with the interface.  Tim Gordon reported intermittent chest soreness.  Tim Gordon reported feeling better rested and nocturia had improved.  Tim Gordon felt that propranolol  40 mg once daily it was helpful for his essential tremor.  Of note, Tim Gordon presented to the emergency room through [facility] in [facility] on 03/23/2022 with blurry vision and dizziness.  I reviewed the emergency room records.  Visual symptoms lasted about 15 to 20 minutes.  Tim Gordon also described a visual scotoma.  Tim Gordon had a head CT without contrast on 03/23/2022 and I reviewed the results: Impression: No acute intracranial  abnormality.  Tim Gordon had a CT angiogram head and neck on 03/23/2022 and I reviewed the results: Impression: CTA HEAD NECK  1.  No large vessel occlusion identified.  2.  No aneurysm or dissection identified.    Degree of stenosis is determined using NASCET measurement technique:  Severe: 70-99%  Moderate: 50-69%.  Mild: Less than 50%.  CMP was benign with the exception of slightly low potassium at 3.5, albumin slightly elevated at 5.3.  CBC with differential showed slightly low WBC at 3.8, otherwise unremarkable.  Tim Gordon had a portable chest x-ray on 03/23/2022 which showed no acute cardiopulmonary disease.  EKG showed sinus bradycardia.        I evaluated Tim Gordon on 10/26/2021 for 2 episodes of dizziness in the recent past.  Tim Gordon had also noticed worsening tremor.  Tim Gordon had tried an oral appliance for sleep apnea in the past but could not tolerate it.  We talked about his symptoms at length.  Tim Gordon was advised to continue with propranolol  for migraine prevention and symptomatic control of his essential tremor.  Tim Gordon was advised to increase it to 20 mg twice daily.  Tim Gordon was advised to proceed with repeat sleep testing.  Tim Gordon had a home sleep test on 11/27/2021 which indicated severe obstructive sleep apnea by number of events with an AHI of 32.4/h, O2 nadir 86% with mild to moderate intermittent snoring detected.  Tim Gordon was advised to start AutoPap therapy.  His set up date was 12/06/2021.  Tim Gordon has a ResMed air sense 11 AutoSet machine.   I reviewed his AutoPap compliance data from 01/30/2022 through 02/28/2022, which is a total of 30 days, during which time Tim Gordon used his machine every night with percent use days greater than 4 hours at 100%, indicating superb compliance with an average usage of 6 hours and 29 minutes, residual AHI at goal at 1.2/h, average pressure for the 95th percentile at 7.1 cm with a range of 5 to 10 cm with EPR.  Leak on the lower side with the 95th percentile at 5 L/min.   10/26/21: (Tim Gordon) reports 2 distinct episodes  of dizziness in the  past 6 months.  Tim Gordon felt lightheaded and felt like Tim Gordon could fall or pass out.  Tim Gordon denies any spinning sensation or nausea.  Tim Gordon did not have a headache at the time but does have a history of migraines.  Years ago Tim Gordon had migraine headaches and some years ago Tim Gordon started having ocular migraines without major headaches. Tim Gordon has had recurrent headaches but these are different from his migraines.  Tim Gordon wakes up in the middle of the night with a headache in the back of his head.  It feels like a tightness in the posterior head and neck muscles radiating to the shoulder areas.  Tim Gordon does not sleep well and snoring has worsened.  Of note, Tim Gordon tried an oral appliance for sleep apnea through Dr. Micky but Tim Gordon developed TMJ issues and changes in his teeth alignment.  Tim Gordon could not tolerate the oral appliance.  His wife reports that his snoring and apneic pauses have become worse.     Tim Gordon takes propranolol  20 mg daily for his essential tremor and has been on the same dose for years.  His tremor has become worse over time as well.   His wife reports that Tim Gordon has had more difficulty with concentration and memory.  Tim Gordon is more irritable.   His blood pressure has been more elevated consistently and Tim Gordon was recently started by his primary care on losartan .  Weight has been fairly stable.  Tim Gordon has reduced his caffeine intake.  Tim Gordon tries to hydrate well with water.  Of note, Tim Gordon has not had an eye examination in over 3 years and Tim Gordon does feel that his prescription is changed.   Tim Gordon had a head CT without contrast on 07/14/2019 and I reviewed the results: IMPRESSION: No focal acute intracranial abnormality identified.   Tim Gordon had a recent brain MRI without contrast through [facility] on 10/13/2019 and I was able to review the results in care everywhere: IMPRESSION: Normal MRI of the brain without contrast.    Of note, Tim Gordon was hospitalized with COVID-pneumonia in October 2020.  I reviewed the hospital records from his admission  from 07/25/2019 through 07/27/2019 at [facility].  Tim Gordon presented with shortness of breath and cough and exposure to COVID-19.  Tim Gordon had developed a severe headache.  Tim Gordon had initially presented to the emergency room on 07/14/2019 with the symptoms and had a head CT, Tim Gordon was not hypoxic at the time and was placed on outpatient steroid treatment.  Tim Gordon developed worsening cough and chest congestion and was noted to be hypoxic. Tim Gordon was treated for pneumonia with Levaquin, Tim Gordon was outside the window for remdesivir.   I had evaluated Tim Gordon over 4 years ago for sleep apnea concerns.  Tim Gordon had a baseline sleep study on 08/05/2017 which showed rather mild obstructive sleep apnea with an AHI of 6.1/h, O2 nadir was 87%.  Tim Gordon was advised to consider AutoPap therapy.  Tim Gordon indicated preference to go through with dentistry with an oral appliance.     06/26/17: 63 year old right-handed gentleman with an underlying medical history of allergies, diverticulitis with status post partial colectomy in 2015, ET, hyperlipidemia, reflux disease, mitral valve prolapse, history of kidney stones, history of ocular migraines, and overweight state, who was previously diagnosed several years ago while in [place], with mild to borderline obstructive sleep apnea for which Tim Gordon tried CPAP. Tim Gordon did not think it helped and could not sleep with it. Tim Gordon stopped it after a month. Tim Gordon reports recurrent headaches for  the past few months. Tim Gordon has also noticed tooth grinding and has started using a mouth guard which has helped a little bit. Tim Gordon has a history of snoring and has woken up in the middle of the night with headaches. His Epworth sleepiness score is 4 out of 24, fatigue score is 22 out of 63. Tim Gordon is married and lives with his wife and 67 year old daughter. Tim Gordon has 4 children, 73 yo son, twins (son and daughter) and 72 yo adoptive daughter. Tim Gordon is a nonsmoker and drinks alcohol occasionally, 4-6 times per month, does not use illicit drugs and  does not drink caffeine on a day-to-day basis. Tim Gordon had L spine surgery 2002. Tim Gordon does not have residual pain. Tim Gordon still has migraines, mostly visual auras. His triggers are sleep deprivation and dehydration, changing time zones. Tim Gordon travels about once a month for his work. Tim Gordon has a lot of overseas travels. Tim Gordon does not sleep well. Tim Gordon has multiple nighttime awakenings. Nocturia is about 2-3 times per average night. His snoring is reportedly loud at times and disturbing to his wife. Tim Gordon has restless leg symptoms. Tim Gordon also has bilateral shoulder pain, left more than right. Tim Gordon has been on clonazepam  at night for sleep for some time, several years. Tim Gordon used to be on 0.25 mg at night, currently 0.5 mg at night. It also seems to help his restless leg symptoms. His oldest son had restless leg symptoms and also took clonazepam  for some time for it.     His Past Medical History Is Significant For: Past Medical History:  Diagnosis Date   Allergy    Blood in stool    Diverticulitis    GERD (gastroesophageal reflux disease)    CONTROLLED WITH DIET - NO MEDS   History of kidney stones    Hyperlipidemia    Hypertension    Migraines    OCULAR MIGRAINES   Pneumonia    walking   Sleep apnea    STATES SLEEP STUDY 4 OR 5 YEARS AGO - TOLD BORDERLINE - TRIED CPAP FOR 30 DAYS - STATES IT DID NOT HELP - NO LONGER USES.    His Past Surgical History Is Significant For: Past Surgical History:  Procedure Laterality Date   APPENDECTOMY     COLON SURGERY     COLONOSCOPY WITH PROPOFOL  N/A 12/20/2023   Procedure: COLONOSCOPY WITH PROPOFOL ;  Surgeon: Rollin Dover, MD;  Location: [facility];  Service: Gastroenterology;  Laterality: N/A;   INGUINAL HERNIA REPAIR Right 12/28/2021   Procedure: LAPAROSCOPIC RIGHT INGUINAL HERNIA REPAIR WITH MESH;  Surgeon: Vernetta Berg, MD;  Location: [facility] OR;  Service: General;  Laterality: Right;   LAPAROSCOPIC PARTIAL COLECTOMY N/A 06/24/2014   Procedure: LAPAROSCOPIC ASSISTED  PARTIAL  COLECTOMY;  Surgeon: Krystal Russell, MD;  Location: [facility];  Service: General;  Laterality: N/A;   NASAL RECONSTRUCTION WITH SEPTAL REPAIR     POLYPECTOMY  12/20/2023   Procedure: POLYPECTOMY;  Surgeon: Rollin Dover, MD;  Location: [facility];  Service: Gastroenterology;;   SPINE SURGERY  2001   rupture L4-L5   VASECTOMY      His Family History Is Significant For: Family History  Problem Relation Age of Onset   Arthritis Mother    Hyperlipidemia Mother    Stroke Mother    Hypertension Mother    Heart disease Mother        valvular heart disease ? which valve.   Cancer Father 73       prostate   Hypertension Father  Cancer Maternal Grandmother        ovarian   Stroke Paternal Grandfather    Sleep apnea Neg Hx     His Social History Is Significant For: Social History   Socioeconomic History   Marital status: Married    Spouse name: Not on file   Number of children: Not on file   Years of education: Not on file   Highest education level: Bachelor's degree (e.g., BA, AB, BS)  Occupational History   Not on file  Tobacco Use   Smoking status: Never    Passive exposure: Never   Smokeless tobacco: Never  Vaping Use   Vaping status: Never Used  Substance and Sexual Activity   Alcohol use: Not Currently    Comment: RARE ALCOHOL   Drug use: No   Sexual activity: Yes    Partners: Female  Other Topics Concern   Not on file  Social History Narrative   Caffeine: 1 cup/day   Social Drivers of Corporate investment banker Strain: Low Risk  (06/15/2024)   Overall Financial Resource Strain (CARDIA)    Difficulty of Paying Living Expenses: Not hard at all  Food Insecurity: No Food Insecurity (07/05/2024)   Hunger Vital Sign    Worried About Running Out of Food in the Last Year: Never true    Ran Out of Food in the Last Year: Never true  Transportation Needs: No Transportation Needs (07/05/2024)   PRAPARE - Administrator, Civil Service (Medical): No    Lack  of Transportation (Non-Medical): No  Physical Activity: Insufficiently Active (06/15/2024)   Exercise Vital Sign    Days of Exercise per Week: 2 days    Minutes of Exercise per Session: 30 min  Stress: No Stress Concern Present (06/15/2024)   [employer] of Occupational Health - Occupational Stress Questionnaire    Feeling of Stress: Only a little  Social Connections: Socially Integrated (06/15/2024)   Social Connection and Isolation Panel    Frequency of Communication with Friends and Family: Twice a week    Frequency of Social Gatherings with Friends and Family: Three times a week    Attends Religious Services: More than 4 times per year    Active Member of Clubs or Organizations: Yes    Attends Banker Meetings: More than 4 times per year    Marital Status: Married    His Allergies Are:  Allergies  Allergen Reactions   Flomax  [Tamsulosin ]     Dizziness, felt funny   Biaxin [Clarithromycin] Diarrhea  :   His Current Medications Are:  Outpatient Encounter Medications as of 07/09/2024  Medication Sig   aspirin EC 81 MG tablet Take 1 tablet (81 mg total) by mouth daily. Swallow whole.   aspirin-acetaminophen -caffeine (EXCEDRIN MIGRAINE) 250-250-65 MG tablet Take by mouth as needed for headache. (Patient taking differently: Take 2 tablets by mouth as needed for headache.)   hydrochlorothiazide  (HYDRODIURIL ) 25 MG tablet Take 1 tablet (25 mg total) by mouth every morning.   losartan  (COZAAR ) 50 MG tablet Take 1 tablet (50 mg total) by mouth daily.   MAGNESIUM CITRATE PO Take 2 tablets by mouth at bedtime.   Multiple Vitamin (MULTIVITAMIN WITH MINERALS) TABS tablet Take 1 tablet by mouth daily.   propranolol  ER (INDERAL  LA) 60 MG 24 hr capsule Take 1 capsule (60 mg total) by mouth daily.   rosuvastatin  (CRESTOR ) 5 MG tablet Take 1 tablet (5 mg total) by mouth daily.   No facility-administered encounter  medications on file as of 07/09/2024.  :  Review of Systems:   Out of a complete 14 point review of systems, all are reviewed and negative with the exception of these symptoms as listed below:  Review of Systems  Neurological:        Patient is here with wife for follow-up. Tim Gordon was in the hospital over the weekend for memory loss. Was told Dx thought to be complicated migraine as opposed to TGA. Pt feeling back to normal today other than tired and off an on headaches since Sunday afternoon. Uses cpap, ESS 4     Objective:  Neurological Exam  Physical Exam Physical Examination:   Vitals:   07/09/24 0850  BP: 123/74  Pulse: 60    General Examination: The patient is a very pleasant 63 y.o. male in no acute distress. Tim Gordon appears well-developed and well-nourished and well groomed.  Only anxious.  HEENT: Normocephalic, atraumatic, pupils are reactive, corrective eyeglasses in place, no photophobia. Tracking is well-preserved. Hearing is grossly intact. Face is symmetric with normal facial animation. Speech is clear with no dysarthria, hypophonia or voice tremor.  Intermittent mild head tremor noted. There are no carotid bruits on auscultation.    Chest: Clear to auscultation without wheezing, rhonchi or crackles noted.   Heart: S1+S2+0, regular and normal without murmurs, rubs or gallops noted.  Mildly bradycardic.   Abdomen: no obvious abnormality, no distention.     Extremities: There is no obvious edema.   Skin: Warm and dry without trophic changes noted.   Musculoskeletal: exam reveals no obvious joint deformities.    Neurologically:  Mental status: The patient is awake, alert and oriented, able to provide his history well.  No evidence of evidence of aphasia, agnosia, apraxia or anomia. Speech is clear with normal prosody and enunciation. Thought process is linear. Mood is normal and affect is normal.  Cranial nerves II - XII are as described above under HEENT exam.  Motor exam: Normal bulk, moves all 4 extremities, mild postural, action  tremor noted.  No resting tremor.  No intention tremor.  Fine motor skills and coordination: intact finger taps, hand movements and rapid alternating patting with both upper extremities, normal foot taps bilaterally in the lower extremities.   Reflexes 2+ throughout, toes are downgoing bilaterally.  Romberg negative. Cerebellar testing: No dysmetria or intention tremor. There is no truncal or gait ataxia.  Normal finger-to-nose, normal heel-to-shin bilaterally. Sensory exam: intact to light touch.  Gait, station and balance: Tim Gordon stands easily. No veering to one side is noted. No leaning to one side is noted. Posture is age-appropriate and stance is narrow based. Gait shows normal stride length and normal pace. No problems turning are noted.     Assessment and Plan:    In summary, Tim Gordon is a 63 year old right-handed gentleman with an underlying medical history of reflux disease, kidney stones, migraine headaches, ocular migraines, nonarteritic AION of the left eye, hyperlipidemia, allergies, essential tremor, diverticulitis, dizziness, sleep apnea, history of transient global amnesia, and overweight state, who presents for a new problem visit after a recent hospitalization, concerning for transient global amnesia versus complicated migraine.  Tim Gordon has a residual headache.  Migraine is certainly a possibility.  Tim Gordon feels much improved, neurological exam is nonfocal with the exception of tremor.  Tim Gordon had an extensive workup in the hospital which I reviewed at length.  Tim Gordon is compliant with his AutoPap and commended for it.  Tim Gordon has trouble maintaining sleep.  We  talked at length again today.  Tim Gordon is advised to limit his caffeine to up to 2 servings per day, avoid caffeine after 3 PM.  Tim Gordon is advised to not restrict his fluid intake and drink at least 64 ounces of water per day.  Tim Gordon is advised to make an appointment with his urologist to discuss other options for his nocturia, Tim Gordon reports that Tim Gordon was told Tim Gordon had  an enlarged prostate.  Tim Gordon is encouraged to make his yearly follow-up appointment for his eye exam routinely.  Tim Gordon is also advised to make sure Tim Gordon does not have any vitamin D deficiency, Tim Gordon can make a routine follow-up with his PCP.  Tim Gordon is advised to follow-up routinely in this clinic in 1 year, sooner if needed.   I spent 45 minutes in total face-to-face time and in reviewing records during pre-charting, more than 50% of which was spent in counseling and coordination of care, reviewing test results, reviewing medications and treatment regimen and/or in discussing or reviewing the diagnosis of OSA, transient neurological symptoms, complicated migraine, the prognosis and treatment options. Pertinent laboratory and imaging test results that were available during this visit with the patient were reviewed by me and considered in my medical decision making (see chart for details).

## 2024-07-09 NOTE — Patient Instructions (Addendum)
 It was nice to see you again today.  I am glad to hear you are feeling better.  Please continue using your autoPAP regularly. While your insurance requires that you use PAP at least 4 hours each night on 70% of the nights, I recommend, that you not skip any nights and use it throughout the night if you can. Getting used to PAP and staying with the treatment long term does take time and patience and discipline. Untreated obstructive sleep apnea when it is moderate to severe can have an adverse impact on cardiovascular health and raise her risk for heart disease, arrhythmias, hypertension, congestive heart failure, stroke and diabetes. Untreated obstructive sleep apnea causes sleep disruption, nonrestorative sleep, and sleep deprivation. This can have an impact on your day to day functioning and cause daytime sleepiness and impairment of cognitive function, memory loss, mood disturbance, and problems focussing. Using PAP regularly can improve these symptoms. Please make an appointment with your urologist to discuss other options for your enlarged prostate. Follow-up with your PCP routinely, make sure you are not vitamin D deficient. Please do not restrict your fluid intake, try to drink at least 64 ounces of water per day. Limit caffeine to 1 or 2 servings per day, avoid caffeine after 3 PM. Make an appointment with your eye doctor for your routine checkup. Follow-up in 1 year to see one of our nurse practitioners.

## 2024-07-17 DIAGNOSIS — M25572 Pain in left ankle and joints of left foot: Secondary | ICD-10-CM | POA: Diagnosis not present

## 2024-07-17 DIAGNOSIS — M6740 Ganglion, unspecified site: Secondary | ICD-10-CM | POA: Diagnosis not present

## 2024-07-22 ENCOUNTER — Ambulatory Visit: Payer: Self-pay

## 2024-07-22 ENCOUNTER — Ambulatory Visit
Admission: RE | Admit: 2024-07-22 | Discharge: 2024-07-22 | Disposition: A | Discharge status: Home / Self Care | Attending: Family Medicine | Admitting: Family Medicine

## 2024-07-22 VITALS — BP 136/81 | HR 64 | Temp 97.8°F | Resp 18

## 2024-07-22 DIAGNOSIS — J019 Acute sinusitis, unspecified: Secondary | ICD-10-CM | POA: Diagnosis not present

## 2024-07-22 DIAGNOSIS — J302 Other seasonal allergic rhinitis: Secondary | ICD-10-CM

## 2024-07-22 DIAGNOSIS — B9689 Other specified bacterial agents as the cause of diseases classified elsewhere: Secondary | ICD-10-CM | POA: Diagnosis not present

## 2024-07-22 MED ORDER — PREDNISONE 20 MG PO TABS: 40.0000 mg | ORAL_TABLET | Freq: Every day | ORAL | 0 refills | Status: DC

## 2024-07-22 MED ORDER — AMOXICILLIN-POT CLAVULANATE 875-125 MG PO TABS: 1.0000 | ORAL_TABLET | Freq: Two times a day (BID) | ORAL | 0 refills | Status: AC

## 2024-07-22 NOTE — ED Provider Notes (Signed)
 [name]    CSN: [id number] Arrival date & time: 07/22/24  9076      History   Chief Complaint Chief Complaint  Patient presents with   Nasal Congestion   Cough    HPI Tim Gordon is a 63 y.o. male.   HPI  Patient states he has seasonal allergies.  He is prone to sinus infections.  He states he helped his son move a chicken coop several days ago.  Has had significant cough and congestion ever since then.  It is worse today with yellow and green mucus.  A lot of pressure and pain.  He states he has a headache.  He states he has pain behind his eyes.  It is going down the back of his throat.  He feels like it is going to his chest.   Past Medical History:  Diagnosis Date   Allergy    Blood in stool    Diverticulitis    GERD (gastroesophageal reflux disease)    CONTROLLED WITH DIET - NO MEDS   History of kidney stones    Hyperlipidemia    Hypertension    Migraines    OCULAR MIGRAINES   Pneumonia    walking   Sleep apnea    STATES SLEEP STUDY 4 OR 5 YEARS AGO - TOLD BORDERLINE - TRIED CPAP FOR 30 DAYS - STATES IT DID NOT HELP - NO LONGER USES.    Patient Active Problem List   Diagnosis Date Noted   Complicated migraine 07/04/2024   Hypokalemia 07/04/2024   Well adult exam 12/30/2023   AK (actinic keratosis) 12/30/2023   Calf pain 12/30/2023   Sinobronchitis 06/25/2023   Hand swelling 06/06/2023   B12 deficiency 12/02/2022   Neoplasm of uncertain behavior of skin 12/02/2022   Insomnia 12/02/2022   GERD (gastroesophageal reflux disease) 06/15/2022   Neck pain 05/07/2022   Right inguinal hernia 11/07/2021   Skin lesion of face 11/07/2021   BPH (benign prostatic hyperplasia) 12/07/2020   Calcific tendinitis 12/30/2017   Tear of left rotator cuff 12/30/2017   Impingement syndrome of left shoulder region 11/08/2017   Atrial bigeminy 11/05/2017   Essential hypertension 08/22/2017   OSA (obstructive sleep apnea) 08/22/2017   Diverticulitis large  intestine 06/24/2014   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 06/25/14 07/27/2013   History of migraine headaches 06/26/2012   Hyperlipidemia 06/26/2012   Ocular migraine 06/26/2012   Essential tremor 06/26/2012   Chronic insomnia 06/26/2012    Past Surgical History:  Procedure Laterality Date   APPENDECTOMY     COLON SURGERY     COLONOSCOPY WITH PROPOFOL  N/A 12/20/2023   Procedure: COLONOSCOPY WITH PROPOFOL ;  Surgeon: Rollin Dover, MD;  Location: [facility];  Service: Gastroenterology;  Laterality: N/A;   INGUINAL HERNIA REPAIR Right 12/28/2021   Procedure: LAPAROSCOPIC RIGHT INGUINAL HERNIA REPAIR WITH MESH;  Surgeon: Vernetta Berg, MD;  Location: [facility] OR;  Service: General;  Laterality: Right;   LAPAROSCOPIC PARTIAL COLECTOMY N/A 06/24/2014   Procedure: LAPAROSCOPIC ASSISTED  PARTIAL COLECTOMY;  Surgeon: Krystal Russell, MD;  Location: [facility];  Service: General;  Laterality: N/A;   NASAL RECONSTRUCTION WITH SEPTAL REPAIR     POLYPECTOMY  12/20/2023   Procedure: POLYPECTOMY;  Surgeon: Rollin Dover, MD;  Location: [facility];  Service: Gastroenterology;;   SPINE SURGERY  2001   rupture L4-L5   VASECTOMY         Home Medications    Prior to Admission medications   Medication Sig  Start Date End Date Taking? Authorizing Provider  amoxicillin -clavulanate (AUGMENTIN ) 875-125 MG tablet Take 1 tablet by mouth every 12 (twelve) hours. 07/22/24  Yes Maranda Jamee Jacob, MD  predniSONE  (DELTASONE ) 20 MG tablet Take 2 tablets (40 mg total) by mouth daily with breakfast. 07/22/24  Yes Maranda Jamee Jacob, MD  aspirin EC 81 MG tablet Take 1 tablet (81 mg total) by mouth daily. Swallow whole. 07/06/24   Patsy Lenis, MD  aspirin-acetaminophen -caffeine (EXCEDRIN MIGRAINE) 250-250-65 MG tablet Take by mouth as needed for headache. Patient taking differently: Take 2 tablets by mouth as needed for headache.    [provider]  hydrochlorothiazide  (HYDRODIURIL ) 25  MG tablet Take 1 tablet (25 mg total) by mouth every morning. 03/11/24   Alvia Bring, DO  losartan  (COZAAR ) 50 MG tablet Take 1 tablet (50 mg total) by mouth daily. 03/11/24   Alvia Bring, DO  MAGNESIUM CITRATE PO Take 2 tablets by mouth at bedtime.    [provider]  Multiple Vitamin (MULTIVITAMIN WITH MINERALS) TABS tablet Take 1 tablet by mouth daily.    [provider]  propranolol  ER (INDERAL  LA) 60 MG 24 hr capsule Take 1 capsule (60 mg total) by mouth daily. 03/11/24   Alvia Bring, DO  rosuvastatin  (CRESTOR ) 5 MG tablet Take 1 tablet (5 mg total) by mouth daily. 06/01/24   Alvia Bring, DO    Family History Family History  Problem Relation Age of Onset   Arthritis Mother    Hyperlipidemia Mother    Stroke Mother    Hypertension Mother    Heart disease Mother        valvular heart disease ? which valve.   Cancer Father 81       prostate   Hypertension Father    Cancer Maternal Grandmother        ovarian   Stroke Paternal Grandfather    Sleep apnea Neg Hx     Social History Social History   Tobacco Use   Smoking status: Never    Passive exposure: Never   Smokeless tobacco: Never  Vaping Use   Vaping status: Never Used  Substance Use Topics   Alcohol use: Not Currently    Comment: RARE ALCOHOL   Drug use: No     Allergies   Flomax  [tamsulosin ] and Biaxin [clarithromycin]   Review of Systems Review of Systems  See HPI Physical Exam Triage Vital Signs ED Triage Vitals  Encounter Vitals Group     BP 07/22/24 0937 136/81     Girls Systolic BP Percentile --      Girls Diastolic BP Percentile --      Boys Systolic BP Percentile --      Boys Diastolic BP Percentile --      Pulse Rate 07/22/24 0937 64     Resp 07/22/24 0937 18     Temp 07/22/24 0937 97.8 F (36.6 C)     Temp Source 07/22/24 0937 Oral     SpO2 07/22/24 0937 95 %     Weight --      Height --      Head Circumference --      Peak Flow --      Pain Score 07/22/24 0938  0     Pain Loc --      Pain Education --      Exclude from Growth Chart --    No data found.  Updated Vital Signs BP 136/81 (BP Location: Right Arm)   Pulse 64  Temp 97.8 F (36.6 C) (Oral)   Resp 18   SpO2 95%   Physical Exam Constitutional:      General: He is not in acute distress.    Appearance: He is well-developed. He is ill-appearing.  HENT:     Head: Normocephalic and atraumatic.     Right Ear: Tympanic membrane normal.     Left Ear: Tympanic membrane normal.     Ears:     Comments: Facial sinuses are tender    Nose: Congestion and rhinorrhea present.     Comments: Nasal turbinates are red and swollen.  Yellow discharge is noted too.  Posterior pharynx mildly injected.  Uvula swollen    Mouth/Throat:     Mouth: Mucous membranes are moist.     Pharynx: Posterior oropharyngeal erythema present.  Eyes:     Conjunctiva/sclera: Conjunctivae normal.     Pupils: Pupils are equal, round, and reactive to light.  Cardiovascular:     Rate and Rhythm: Normal rate and regular rhythm.     Heart sounds: Normal heart sounds.  Pulmonary:     Effort: Pulmonary effort is normal. No respiratory distress.     Breath sounds: Normal breath sounds.  Abdominal:     General: There is no distension.     Palpations: Abdomen is soft.  Musculoskeletal:        General: Normal range of motion.     Cervical back: Normal range of motion.  Lymphadenopathy:     Cervical: No cervical adenopathy.  Skin:    General: Skin is warm and dry.  Neurological:     Mental Status: He is alert.      UC Treatments / Results  Labs (all labs ordered are listed, but only abnormal results are displayed) Labs Reviewed - No data to display  EKG   Radiology No results found.  Procedures Procedures (including critical care time)  Medications Ordered in UC Medications - No data to display  Initial Impression / Assessment and Plan / UC Course  I have reviewed the triage vital signs and the  nursing notes.  Pertinent labs & imaging results that were available during my care of the patient were reviewed by me and considered in my medical decision making (see chart for details).     Final Clinical Impressions(s) / UC Diagnoses   Final diagnoses:  Acute bacterial sinusitis  Seasonal allergies     Discharge Instructions      Increase your fluids Consider a saline nasal spray or rinse Take Augmentin  2 times a day with food Take prednisone  once a day for the next 5 days    ED Prescriptions     Medication Sig Dispense Auth. Provider   amoxicillin -clavulanate (AUGMENTIN ) 875-125 MG tablet Take 1 tablet by mouth every 12 (twelve) hours. 14 tablet Maranda Jamee Jacob, MD   predniSONE  (DELTASONE ) 20 MG tablet Take 2 tablets (40 mg total) by mouth daily with breakfast. 10 tablet Maranda Jamee Jacob, MD      PDMP not reviewed this encounter.   Maranda Jamee Jacob, MD 07/22/24 [phone]

## 2024-07-22 NOTE — Telephone Encounter (Signed)
 FYI Only or Action Required?: FYI only for provider.  Patient was last seen in primary care on 06/15/2024 by Willo Mini, NP.  Called Nurse Triage reporting Recurrent Sinusitis.  Symptoms began several days ago.  Interventions attempted: Nothing.  Symptoms are: sinus congestion, nasal drainage (green-yellow), watery eyes, fatigue gradually worsening.  Triage Disposition: See Physician Within 4 Hours (or PCP Triage) (overriding Home Care)  Patient/caregiver understands and will follow disposition?: Yes               Copied from CRM [id number]. Topic: Clinical - Red Word Triage >> Jul 22, 2024  8:46 AM Tim Gordon wrote: Red Word that prompted transfer to Nurse Triage: possible sinus infection, blowing out yellow/greenish, watery eye Reason for Disposition  [1] Sinus congestion as part of a cold AND [2] present < 10 days  Answer Assessment - Initial Assessment Questions Patient requesting an appointment to be seen today, scheduled with [facility] urgent care.   1. LOCATION: Where does it hurt?      Sinus pressure (both sides) last night.  2. ONSET: When did the sinus pain start?  (e.g., hours, days)      Monday afternoon. He states over the weekend he helped his son move a chicken coop and thinks this could have been started from allergies.  3. SEVERITY: How bad is the pain?   (Scale 0-10; or none, mild, moderate or severe)     No pain at this time.  4. RECURRENT SYMPTOM: Have you ever had sinus problems before? If Yes, ask: When was the last time? and What happened that time?      Yes. He states he had a sinus infection 06/15/24 and was treated with azithromycin .  5. NASAL CONGESTION: Is the nose blocked? If Yes, ask: Can you open it or must you breathe through your mouth?     Yes. He states one side was blocked during the night and could not open it.  6. NASAL DISCHARGE: Do you have discharge from your nose? If so ask, What color?     Yes.  Yellow-green.  7. FEVER: Do you have a fever? If Yes, ask: What is it, how was it measured, and when did it start?      He states he did not check it but thinks he had a fever last night. No fever this morning: 97.8, 98.1.  8. OTHER SYMPTOMS: Do you have any other symptoms? (e.g., sore throat, cough, earache, difficulty breathing)     Denies difficulty breathing. He states he has been having a heavy feeling in his chest, cough. Fatigue, watery eyes.  9. PREGNANCY: Is there any chance you are pregnant? When was your last menstrual period?     N/A.  Protocols used: Sinus Pain or Congestion-A-AH

## 2024-07-22 NOTE — Discharge Instructions (Signed)
 Increase your fluids Consider a saline nasal spray or rinse Take Augmentin  2 times a day with food Take prednisone  once a day for the next 5 days

## 2024-07-22 NOTE — ED Triage Notes (Addendum)
 Pt c/o nasal congestion and cough since Sunday evening. Says he helped son move a chicken coop on Sat. Denies fever. Yellow/green mucous. Hx of seasonal allergies and sinus infection. Tylenol  cold and flu prn. Uses CPAP.

## 2024-07-23 ENCOUNTER — Telehealth: Payer: Self-pay

## 2024-07-23 NOTE — Telephone Encounter (Signed)
Called to check on patient. No answer. Left voicemail.

## 2024-08-06 ENCOUNTER — Ambulatory Visit: Admitting: Neurology

## 2024-08-11 DIAGNOSIS — R1032 Left lower quadrant pain: Secondary | ICD-10-CM | POA: Diagnosis not present

## 2024-08-11 DIAGNOSIS — Z860100 Personal history of colon polyps, unspecified: Secondary | ICD-10-CM | POA: Diagnosis not present

## 2024-08-11 DIAGNOSIS — R194 Change in bowel habit: Secondary | ICD-10-CM | POA: Diagnosis not present

## 2024-08-11 DIAGNOSIS — K5732 Diverticulitis of large intestine without perforation or abscess without bleeding: Secondary | ICD-10-CM | POA: Diagnosis not present

## 2024-09-07 ENCOUNTER — Encounter: Payer: Self-pay | Admitting: Family Medicine

## 2024-09-07 DIAGNOSIS — I10 Essential (primary) hypertension: Secondary | ICD-10-CM

## 2024-09-07 MED ORDER — PROPRANOLOL HCL ER 60 MG PO CP24: 60.0000 mg | ORAL_CAPSULE | Freq: Every day | ORAL | 1 refills | Status: AC

## 2024-09-07 MED ORDER — LOSARTAN POTASSIUM 50 MG PO TABS: 50.0000 mg | ORAL_TABLET | Freq: Every day | ORAL | 1 refills | Status: AC

## 2024-10-20 ENCOUNTER — Other Ambulatory Visit: Payer: Self-pay | Admitting: Family Medicine

## 2024-10-20 DIAGNOSIS — I10 Essential (primary) hypertension: Secondary | ICD-10-CM

## 2024-11-13 ENCOUNTER — Ambulatory Visit: Admitting: Medical-Surgical

## 2024-11-13 ENCOUNTER — Encounter: Payer: Self-pay | Admitting: Medical-Surgical

## 2024-11-13 MED ORDER — DOXYCYCLINE HYCLATE 100 MG PO TABS: 100.0000 mg | ORAL_TABLET | Freq: Two times a day (BID) | ORAL | 0 refills | Status: AC

## 2024-11-13 MED ORDER — METHYLPREDNISOLONE 4 MG PO TBPK: ORAL_TABLET | ORAL | 0 refills | Status: AC

## 2024-11-13 NOTE — Progress Notes (Unsigned)
" ° °       Established patient visit   History of Present Illness   Discussed the use of AI scribe software for clinical note transcription with the patient, who gave verbal consent to proceed.  History of Present Illness          Physical Exam   Physical Exam Vitals reviewed.  Constitutional:      General: He is not in acute distress.    Appearance: Normal appearance. He is not ill-appearing.  HENT:     Head: Normocephalic and atraumatic.     Right Ear: Ear canal and external ear normal. There is no impacted cerumen. Tympanic membrane is erythematous.     Left Ear: Tympanic membrane, ear canal and external ear normal. There is no impacted cerumen.     Nose: Congestion present.     Mouth/Throat:     Mouth: Mucous membranes are moist.     Pharynx: Posterior oropharyngeal erythema present.  Eyes:     General: No scleral icterus.       Right eye: No discharge.        Left eye: No discharge.     Extraocular Movements: Extraocular movements intact.     Pupils: Pupils are equal, round, and reactive to light.  Cardiovascular:     Rate and Rhythm: Normal rate and regular rhythm.     Pulses: Normal pulses.     Heart sounds: Normal heart sounds. No murmur heard.    No friction rub. No gallop.  Pulmonary:     Effort: Pulmonary effort is normal. No respiratory distress.     Breath sounds: Normal breath sounds.  Lymphadenopathy:     Cervical: No cervical adenopathy.  Skin:    General: Skin is warm and dry.  Neurological:     Mental Status: He is alert and oriented to person, place, and time.  Psychiatric:        Mood and Affect: Mood normal.        Behavior: Behavior normal.        Thought Content: Thought content normal.        Judgment: Judgment normal.    Assessment & Plan    Assessment and Plan             Follow up   No follow-ups on file. __________________________________ Zada FREDRIK Palin, DNP, APRN, FNP-BC Primary Care and Sports Medicine [facility] [place] "

## 2024-12-30 ENCOUNTER — Ambulatory Visit: Admitting: Family Medicine

## 2026-07-11 ENCOUNTER — Ambulatory Visit: Admitting: Adult Health
